# Patient Record
Sex: Female | Born: 2000 | Race: White | Hispanic: No | Smoking: Former smoker
Health system: Southern US, Community
[De-identification: ages and names within clinical notes are randomized; demographics above are authoritative.]

## PROBLEM LIST (undated history)

## (undated) ENCOUNTER — Inpatient Hospital Stay: Payer: Self-pay

## (undated) DIAGNOSIS — G459 Transient cerebral ischemic attack, unspecified: Secondary | ICD-10-CM

## (undated) DIAGNOSIS — I639 Cerebral infarction, unspecified: Secondary | ICD-10-CM

## (undated) DIAGNOSIS — K3580 Unspecified acute appendicitis: Secondary | ICD-10-CM

## (undated) HISTORY — DX: Transient cerebral ischemic attack, unspecified: G45.9

## (undated) HISTORY — PX: APPENDECTOMY: SHX54

---
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
## 2019-01-28 ENCOUNTER — Observation Stay
Admission: EM | Admit: 2019-01-28 | Discharge: 2019-01-29 | Disposition: A | Discharge status: Home / Self Care | Payer: Self-pay | Attending: Surgery | Admitting: Surgery

## 2019-01-28 ENCOUNTER — Encounter: Payer: Self-pay | Admitting: Emergency Medicine

## 2019-01-28 ENCOUNTER — Observation Stay: Payer: Self-pay | Admitting: Anesthesiology

## 2019-01-28 ENCOUNTER — Emergency Department: Payer: Self-pay

## 2019-01-28 ENCOUNTER — Encounter
Admission: EM | Disposition: A | Discharge status: Home / Self Care | Payer: Self-pay | Source: Home / Self Care | Attending: Emergency Medicine

## 2019-01-28 ENCOUNTER — Other Ambulatory Visit: Payer: Self-pay

## 2019-01-28 DIAGNOSIS — Z79899 Other long term (current) drug therapy: Secondary | ICD-10-CM | POA: Insufficient documentation

## 2019-01-28 DIAGNOSIS — K3580 Unspecified acute appendicitis: Secondary | ICD-10-CM

## 2019-01-28 DIAGNOSIS — Z791 Long term (current) use of non-steroidal anti-inflammatories (NSAID): Secondary | ICD-10-CM | POA: Insufficient documentation

## 2019-01-28 DIAGNOSIS — K3589 Other acute appendicitis without perforation or gangrene: Principal | ICD-10-CM | POA: Insufficient documentation

## 2019-01-28 DIAGNOSIS — E860 Dehydration: Secondary | ICD-10-CM | POA: Insufficient documentation

## 2019-01-28 DIAGNOSIS — Z1159 Encounter for screening for other viral diseases: Secondary | ICD-10-CM | POA: Insufficient documentation

## 2019-01-28 DIAGNOSIS — R112 Nausea with vomiting, unspecified: Secondary | ICD-10-CM

## 2019-01-28 DIAGNOSIS — D72828 Other elevated white blood cell count: Secondary | ICD-10-CM

## 2019-01-28 DIAGNOSIS — K353 Acute appendicitis with localized peritonitis, without perforation or gangrene: Secondary | ICD-10-CM

## 2019-01-28 DIAGNOSIS — R1084 Generalized abdominal pain: Secondary | ICD-10-CM

## 2019-01-28 HISTORY — DX: Unspecified acute appendicitis: K35.80

## 2019-01-28 HISTORY — PX: LAPAROSCOPIC APPENDECTOMY: SHX408

## 2019-01-28 LAB — URINALYSIS, ROUTINE W REFLEX MICROSCOPIC
Bilirubin Urine: NEGATIVE
Glucose, UA: NEGATIVE mg/dL
Hgb urine dipstick: NEGATIVE
Ketones, ur: 20 mg/dL — AB
Leukocytes,Ua: NEGATIVE
Nitrite: NEGATIVE
Protein, ur: NEGATIVE mg/dL
Specific Gravity, Urine: 1.024 (ref 1.005–1.030)
pH: 6 (ref 5.0–8.0)

## 2019-01-28 LAB — CBC WITH DIFFERENTIAL/PLATELET
Abs Immature Granulocytes: 0.06 10*3/uL (ref 0.00–0.07)
Basophils Absolute: 0 10*3/uL (ref 0.0–0.1)
Basophils Relative: 0 %
Eosinophils Absolute: 0 10*3/uL (ref 0.0–0.5)
Eosinophils Relative: 0 %
HCT: 42.6 % (ref 36.0–46.0)
Hemoglobin: 14.7 g/dL (ref 12.0–15.0)
Immature Granulocytes: 0 %
Lymphocytes Relative: 5 %
Lymphs Abs: 0.8 10*3/uL (ref 0.7–4.0)
MCH: 28.7 pg (ref 26.0–34.0)
MCHC: 34.5 g/dL (ref 30.0–36.0)
MCV: 83.2 fL (ref 80.0–100.0)
Monocytes Absolute: 0.4 10*3/uL (ref 0.1–1.0)
Monocytes Relative: 2 %
Neutro Abs: 15.2 10*3/uL — ABNORMAL HIGH (ref 1.7–7.7)
Neutrophils Relative %: 93 %
Platelets: 281 10*3/uL (ref 150–400)
RBC: 5.12 MIL/uL — ABNORMAL HIGH (ref 3.87–5.11)
RDW: 12.1 % (ref 11.5–15.5)
WBC: 16.5 10*3/uL — ABNORMAL HIGH (ref 4.0–10.5)
nRBC: 0 % (ref 0.0–0.2)

## 2019-01-28 LAB — SARS CORONAVIRUS 2 BY RT PCR (HOSPITAL ORDER, PERFORMED IN ~~LOC~~ HOSPITAL LAB): SARS Coronavirus 2: NEGATIVE

## 2019-01-28 LAB — COMPREHENSIVE METABOLIC PANEL
ALT: 12 U/L (ref 0–44)
AST: 18 U/L (ref 15–41)
Albumin: 4.8 g/dL (ref 3.5–5.0)
Alkaline Phosphatase: 52 U/L (ref 38–126)
Anion gap: 10 (ref 5–15)
BUN: 15 mg/dL (ref 6–20)
CO2: 23 mmol/L (ref 22–32)
Calcium: 9.4 mg/dL (ref 8.9–10.3)
Chloride: 103 mmol/L (ref 98–111)
Creatinine, Ser: 0.7 mg/dL (ref 0.44–1.00)
GFR calc Af Amer: >60 mL/min (ref >60)
GFR calc non Af Amer: >60 mL/min (ref >60)
Glucose, Bld: 124 mg/dL — ABNORMAL HIGH (ref 70–99)
Potassium: 3.7 mmol/L (ref 3.5–5.1)
Sodium: 136 mmol/L (ref 135–145)
Total Bilirubin: 1.2 mg/dL (ref 0.3–1.2)
Total Protein: 8.1 g/dL (ref 6.5–8.1)

## 2019-01-28 LAB — POCT PREGNANCY, URINE: Preg Test, Ur: NEGATIVE

## 2019-01-28 LAB — LIPASE, BLOOD: Lipase: 28 U/L (ref 11–51)

## 2019-01-28 SURGERY — APPENDECTOMY, LAPAROSCOPIC
Anesthesia: General

## 2019-01-28 MED ORDER — ONDANSETRON HCL 4 MG/2ML IJ SOLN
4.0000 mg | Freq: Once | INTRAMUSCULAR | Status: AC
  Administered 2019-01-28: 4 mg via INTRAVENOUS
  Filled 2019-01-28: qty 2

## 2019-01-28 MED ORDER — IOHEXOL 300 MG/ML  SOLN
75.0000 mL | Freq: Once | INTRAMUSCULAR | Status: AC | PRN
  Administered 2019-01-28: 75 mL via INTRAVENOUS

## 2019-01-28 MED ORDER — SODIUM CHLORIDE 0.9 % IV SOLN: 2.0000 g | INTRAVENOUS | Status: DC

## 2019-01-28 MED ORDER — SODIUM CHLORIDE 0.9 % IV BOLUS
1000.0000 mL | Freq: Once | INTRAVENOUS | Status: AC
  Administered 2019-01-28: 1000 mL via INTRAVENOUS

## 2019-01-28 MED ORDER — FENTANYL CITRATE (PF) 100 MCG/2ML IJ SOLN
INTRAMUSCULAR | Status: AC
  Filled 2019-01-28: qty 2

## 2019-01-28 MED ORDER — LIDOCAINE HCL (PF) 2 % IJ SOLN
INTRAMUSCULAR | Status: AC
  Filled 2019-01-28: qty 10

## 2019-01-28 MED ORDER — ONDANSETRON HCL 4 MG/2ML IJ SOLN
4.0000 mg | Freq: Four times a day (QID) | INTRAMUSCULAR | Status: DC | PRN
  Administered 2019-01-28: 14:00:00 4 mg via INTRAVENOUS

## 2019-01-28 MED ORDER — SODIUM CHLORIDE 0.9 % IV SOLN
INTRAVENOUS | Status: DC | PRN
  Administered 2019-01-28: 14:00:00 via INTRAVENOUS

## 2019-01-28 MED ORDER — MIDAZOLAM HCL 2 MG/2ML IJ SOLN
INTRAMUSCULAR | Status: AC
  Filled 2019-01-28: qty 2

## 2019-01-28 MED ORDER — FENTANYL CITRATE (PF) 100 MCG/2ML IJ SOLN
INTRAMUSCULAR | Status: DC | PRN
  Administered 2019-01-28 (×2): 50 ug via INTRAVENOUS

## 2019-01-28 MED ORDER — SODIUM CHLORIDE 0.9 % IV SOLN
2.0000 g | INTRAVENOUS | Status: AC
  Administered 2019-01-29: 2 g via INTRAVENOUS
  Filled 2019-01-28: qty 2
  Filled 2019-01-28: qty 20

## 2019-01-28 MED ORDER — ENOXAPARIN SODIUM 40 MG/0.4ML ~~LOC~~ SOLN: 40.0000 mg | SUBCUTANEOUS | Status: DC

## 2019-01-28 MED ORDER — SUGAMMADEX SODIUM 200 MG/2ML IV SOLN
INTRAVENOUS | Status: AC
  Filled 2019-01-28: qty 2

## 2019-01-28 MED ORDER — KCL IN DEXTROSE-NACL 20-5-0.45 MEQ/L-%-% IV SOLN
INTRAVENOUS | Status: DC
  Administered 2019-01-28 – 2019-01-29 (×2): via INTRAVENOUS
  Filled 2019-01-28 (×4): qty 1000

## 2019-01-28 MED ORDER — MORPHINE SULFATE (PF) 2 MG/ML IV SOLN: 2.0000 mg | INTRAVENOUS | Status: DC | PRN

## 2019-01-28 MED ORDER — SODIUM CHLORIDE 0.9 % IV SOLN
Freq: Once | INTRAVENOUS | Status: AC
  Administered 2019-01-28: 09:00:00 via INTRAVENOUS

## 2019-01-28 MED ORDER — METRONIDAZOLE IN NACL 5-0.79 MG/ML-% IV SOLN: 500.0000 mg | Freq: Three times a day (TID) | INTRAVENOUS | Status: DC

## 2019-01-28 MED ORDER — PROPOFOL 10 MG/ML IV BOLUS
INTRAVENOUS | Status: AC
  Filled 2019-01-28: qty 20

## 2019-01-28 MED ORDER — METRONIDAZOLE IN NACL 5-0.79 MG/ML-% IV SOLN
500.0000 mg | Freq: Once | INTRAVENOUS | Status: AC
  Administered 2019-01-28: 500 mg via INTRAVENOUS
  Filled 2019-01-28: qty 100

## 2019-01-28 MED ORDER — OXYCODONE HCL 5 MG PO TABS
5.0000 mg | ORAL_TABLET | ORAL | Status: DC | PRN
  Administered 2019-01-28 – 2019-01-29 (×3): 5 mg via ORAL
  Filled 2019-01-28 (×3): qty 1

## 2019-01-28 MED ORDER — FENTANYL CITRATE (PF) 100 MCG/2ML IJ SOLN: 25.0000 ug | INTRAMUSCULAR | Status: DC | PRN

## 2019-01-28 MED ORDER — PROPOFOL 10 MG/ML IV BOLUS
INTRAVENOUS | Status: DC | PRN
  Administered 2019-01-28: 150 mg via INTRAVENOUS

## 2019-01-28 MED ORDER — DICYCLOMINE HCL 10 MG PO CAPS: 20.0000 mg | ORAL_CAPSULE | Freq: Once | ORAL | Status: DC

## 2019-01-28 MED ORDER — ROCURONIUM BROMIDE 100 MG/10ML IV SOLN
INTRAVENOUS | Status: DC | PRN
  Administered 2019-01-28: 50 mg via INTRAVENOUS

## 2019-01-28 MED ORDER — MIDAZOLAM HCL 2 MG/2ML IJ SOLN
INTRAMUSCULAR | Status: DC | PRN
  Administered 2019-01-28: 2 mg via INTRAVENOUS

## 2019-01-28 MED ORDER — LIDOCAINE HCL 1 % IJ SOLN
INTRAMUSCULAR | Status: DC | PRN
  Administered 2019-01-28: 20 mL via SUBCUTANEOUS

## 2019-01-28 MED ORDER — OXYCODONE HCL 5 MG PO TABS: 5.0000 mg | ORAL_TABLET | Freq: Once | ORAL | Status: DC | PRN

## 2019-01-28 MED ORDER — ONDANSETRON 4 MG PO TBDP: 4.0000 mg | ORAL_TABLET | Freq: Four times a day (QID) | ORAL | Status: DC | PRN

## 2019-01-28 MED ORDER — KETOROLAC TROMETHAMINE 30 MG/ML IJ SOLN
INTRAMUSCULAR | Status: AC
  Filled 2019-01-28: qty 1

## 2019-01-28 MED ORDER — MORPHINE SULFATE (PF) 4 MG/ML IV SOLN
4.0000 mg | Freq: Once | INTRAVENOUS | Status: AC
  Administered 2019-01-28: 4 mg via INTRAVENOUS
  Filled 2019-01-28: qty 1

## 2019-01-28 MED ORDER — LACTATED RINGERS IV SOLN: INTRAVENOUS | Status: DC

## 2019-01-28 MED ORDER — DEXAMETHASONE SODIUM PHOSPHATE 10 MG/ML IJ SOLN
INTRAMUSCULAR | Status: DC | PRN
  Administered 2019-01-28: 4 mg via INTRAVENOUS

## 2019-01-28 MED ORDER — KETOROLAC TROMETHAMINE 30 MG/ML IJ SOLN
30.0000 mg | Freq: Four times a day (QID) | INTRAMUSCULAR | Status: DC
  Administered 2019-01-28 – 2019-01-29 (×3): 30 mg via INTRAVENOUS
  Filled 2019-01-28 (×3): qty 1

## 2019-01-28 MED ORDER — ROCURONIUM BROMIDE 50 MG/5ML IV SOLN
INTRAVENOUS | Status: AC
  Filled 2019-01-28: qty 1

## 2019-01-28 MED ORDER — FENTANYL CITRATE (PF) 250 MCG/5ML IJ SOLN
INTRAMUSCULAR | Status: AC
  Filled 2019-01-28: qty 5

## 2019-01-28 MED ORDER — ACETAMINOPHEN 500 MG PO TABS
1000.0000 mg | ORAL_TABLET | Freq: Four times a day (QID) | ORAL | Status: DC
  Administered 2019-01-28 – 2019-01-29 (×3): 1000 mg via ORAL
  Filled 2019-01-28 (×3): qty 2

## 2019-01-28 MED ORDER — SUGAMMADEX SODIUM 200 MG/2ML IV SOLN
INTRAVENOUS | Status: DC | PRN
  Administered 2019-01-28: 100 mg via INTRAVENOUS

## 2019-01-28 MED ORDER — BUPIVACAINE HCL (PF) 0.5 % IJ SOLN
INTRAMUSCULAR | Status: AC
  Filled 2019-01-28: qty 30

## 2019-01-28 MED ORDER — IOHEXOL 300 MG/ML  SOLN: 100.0000 mL | Freq: Once | INTRAMUSCULAR | Status: DC | PRN

## 2019-01-28 MED ORDER — KETOROLAC TROMETHAMINE 30 MG/ML IJ SOLN
INTRAMUSCULAR | Status: DC | PRN
  Administered 2019-01-28: 30 mg via INTRAVENOUS

## 2019-01-28 MED ORDER — LIDOCAINE HCL (PF) 1 % IJ SOLN
INTRAMUSCULAR | Status: AC
  Filled 2019-01-28: qty 30

## 2019-01-28 MED ORDER — ONDANSETRON HCL 4 MG/2ML IJ SOLN
INTRAMUSCULAR | Status: AC
  Filled 2019-01-28: qty 2

## 2019-01-28 MED ORDER — SODIUM CHLORIDE 0.9 % IV SOLN
2.0000 g | Freq: Once | INTRAVENOUS | Status: AC
  Administered 2019-01-28: 2 g via INTRAVENOUS
  Filled 2019-01-28: qty 20

## 2019-01-28 MED ORDER — KETOROLAC TROMETHAMINE 15 MG/ML IJ SOLN
15.0000 mg | Freq: Once | INTRAMUSCULAR | Status: DC
  Filled 2019-01-28: qty 1

## 2019-01-28 MED ORDER — METRONIDAZOLE IN NACL 5-0.79 MG/ML-% IV SOLN
500.0000 mg | Freq: Three times a day (TID) | INTRAVENOUS | Status: AC
  Administered 2019-01-28 – 2019-01-29 (×2): 500 mg via INTRAVENOUS
  Filled 2019-01-28 (×3): qty 100

## 2019-01-28 MED ORDER — ONDANSETRON HCL 4 MG/2ML IJ SOLN
4.0000 mg | Freq: Four times a day (QID) | INTRAMUSCULAR | Status: DC | PRN
  Administered 2019-01-28: 4 mg via INTRAVENOUS
  Filled 2019-01-28: qty 2

## 2019-01-28 MED ORDER — OXYCODONE HCL 5 MG/5ML PO SOLN: 5.0000 mg | Freq: Once | ORAL | Status: DC | PRN

## 2019-01-28 MED ORDER — OXYCODONE-ACETAMINOPHEN 5-325 MG PO TABS: 1.0000 | ORAL_TABLET | ORAL | 0 refills | Status: DC | PRN

## 2019-01-28 MED ORDER — SUCCINYLCHOLINE CHLORIDE 20 MG/ML IJ SOLN
INTRAMUSCULAR | Status: DC | PRN
  Administered 2019-01-28: 80 mg via INTRAVENOUS

## 2019-01-28 MED ORDER — SODIUM CHLORIDE 0.9 % IV SOLN: INTRAVENOUS | Status: DC | PRN

## 2019-01-28 MED ORDER — GLYCOPYRROLATE 0.2 MG/ML IJ SOLN
INTRAMUSCULAR | Status: AC
  Filled 2019-01-28: qty 1

## 2019-01-28 MED ORDER — PROPOFOL 10 MG/ML IV BOLUS
INTRAVENOUS | Status: AC
  Filled 2019-01-28: qty 40

## 2019-01-28 SURGICAL SUPPLY — 40 items
APPLIER CLIP LOGIC TI 5 (MISCELLANEOUS) ×3 IMPLANT
BLADE SURG SZ11 CARB STEEL (BLADE) ×3 IMPLANT
CANISTER SUCT 3000ML PPV (MISCELLANEOUS) ×3 IMPLANT
CHLORAPREP W/TINT 26 (MISCELLANEOUS) ×3 IMPLANT
COVER WAND RF STERILE (DRAPES) ×3 IMPLANT
CUTTER FLEX LINEAR 45M (STAPLE) ×3 IMPLANT
DECANTER SPIKE VIAL GLASS SM (MISCELLANEOUS) ×3 IMPLANT
DERMABOND ADVANCED (GAUZE/BANDAGES/DRESSINGS) ×2
DERMABOND ADVANCED .7 DNX12 (GAUZE/BANDAGES/DRESSINGS) ×1 IMPLANT
ELECT REM PT RETURN 9FT ADLT (ELECTROSURGICAL) ×3
ELECTRODE REM PT RTRN 9FT ADLT (ELECTROSURGICAL) ×1 IMPLANT
GLOVE BIO SURGEON STRL SZ7 (GLOVE) ×3 IMPLANT
GLOVE BIOGEL PI IND STRL 7.5 (GLOVE) ×1 IMPLANT
GLOVE BIOGEL PI INDICATOR 7.5 (GLOVE) ×2
GOWN STRL REUS W/ TWL LRG LVL4 (GOWN DISPOSABLE) ×1 IMPLANT
GOWN STRL REUS W/ TWL XL LVL3 (GOWN DISPOSABLE) ×1 IMPLANT
GOWN STRL REUS W/TWL LRG LVL4 (GOWN DISPOSABLE) ×2
GOWN STRL REUS W/TWL XL LVL3 (GOWN DISPOSABLE) ×2
GRASPER SUT TROCAR 14GX15 (MISCELLANEOUS) ×3 IMPLANT
IRRIGATION STRYKERFLOW (MISCELLANEOUS) IMPLANT
IRRIGATOR STRYKERFLOW (MISCELLANEOUS)
KIT TURNOVER KIT A (KITS) ×3 IMPLANT
NEEDLE HYPO 22GX1.5 SAFETY (NEEDLE) ×3 IMPLANT
NEEDLE INSUFFLATION 14GA 120MM (NEEDLE) ×3 IMPLANT
NS IRRIG 1000ML POUR BTL (IV SOLUTION) ×3 IMPLANT
PACK LAP CHOLECYSTECTOMY (MISCELLANEOUS) ×3 IMPLANT
POUCH SPECIMEN RETRIEVAL 10MM (ENDOMECHANICALS) ×3 IMPLANT
RELOAD 45 VASCULAR/THIN (ENDOMECHANICALS) IMPLANT
RELOAD STAPLE TA45 3.5 REG BLU (ENDOMECHANICALS) ×3 IMPLANT
SET TUBE SMOKE EVAC HIGH FLOW (TUBING) ×3 IMPLANT
SHEARS HARMONIC ACE PLUS 36CM (ENDOMECHANICALS) ×3 IMPLANT
SLEEVE ENDOPATH XCEL 5M (ENDOMECHANICALS) ×3 IMPLANT
SOL .9 NS 3000ML IRR  AL (IV SOLUTION)
SOL .9 NS 3000ML IRR UROMATIC (IV SOLUTION) IMPLANT
SUT MNCRL AB 4-0 PS2 18 (SUTURE) ×3 IMPLANT
SUT VICRYL 0 UR6 27IN ABS (SUTURE) ×3 IMPLANT
SUT VICRYL AB 3-0 FS1 BRD 27IN (SUTURE) ×3 IMPLANT
TRAY FOLEY MTR SLVR 16FR STAT (SET/KITS/TRAYS/PACK) ×3 IMPLANT
TROCAR XCEL 12X100 BLDLESS (ENDOMECHANICALS) ×3 IMPLANT
TROCAR XCEL NON-BLD 5MMX100MML (ENDOMECHANICALS) ×3 IMPLANT

## 2019-01-28 NOTE — Op Note (Signed)
SURGICAL OPERATIVE REPORT  DATE OF PROCEDURE: 01/28/2019  ATTENDING Surgeon(s): Ancil Linsey, MD  ASSISTANT(S): None  ANESTHESIA: GETA (General)  PRE-OPERATIVE DIAGNOSIS: Acute non-perforated appendicitis with localized peritonitis (K35.30)  POST-OPERATIVE DIAGNOSIS: Acute non-perforated appendicitis with localized peritonitis (K35.30)  PROCEDURE(S):  1.) Laparoscopic appendectomy (cpt: 44970)  INTRAOPERATIVE FINDINGS: Moderately inflamed non-perforated appendix with murky pelvic and minimal peri-appendiceal ascites  INTRAVENOUS FLUIDS: 1100 mL crystalloid   ESTIMATED BLOOD LOSS: Minimal (<20 mL)  URINE OUTPUT: 100 mL   SPECIMENS: Appendix  IMPLANTS: None  DRAINS: None  COMPLICATIONS: None apparent  CONDITION AT END OF PROCEDURE: Hemodynamically stable and extubated  DISPOSITION OF PATIENT: PACU  INDICATIONS FOR PROCEDURE:  Patient is a 18 y.o. otherwise reportedly healthy female who presented with acute onset of peri-umbilical abdominal pain that woke the patient from sleep and then progressed to become greatest at her Right lower quadrant and lower abdomen. Patient reported her pain was overall well-controlled in the Emergency Department. All risks, benefits, and alternatives to appendectomy were discussed with the patient, all of patient's questions were answered to her expressed satisfaction, and informed consent was obtained and documented.  DETAILS OF PROCEDURE: Patient was brought to the operating suite and appropriately identified. General anesthesia was administered along with confirmation of appropriate pre-operative antibiotics, and endotracheal intubation was performed by anesthetist, along with NG/OG tube for gastric decompression. In supine position, operative site was prepped and draped in usual sterile fashion, and following a brief time out, initial 5 mm incision was made in a natural skin crease just below the umbilicus. Fascia was then elevated, and  a Verress needle was inserted and its proper position confirmed using saline meniscus test prior to abdominal insufflation.  Upon insufflation of the abdominal cavity with carbon dioxide to a well-tolerated pressure of 12-15 mmHg, a 5 mm peri-umbilical port followed by laparoscope were inserted and used to inspect the abdominal cavity and its contents with no injuries from insertion of the first trochar noted. Two additional trocars were inserted, a 12 mm port at the Left lower quadrant position and another 5 mm port at the suprapubic position. The table was then placed in Trendelenburg position with the Right side up, and blunt graspers were gently used to retract the bowel overlying a moderately inflamed appendix surrounded by mild peri-appendiceal inflammation and murky pelvic and minimal peri-appendiceal ascites. The appendix was gently retracted by near its tip, and the base of the appendix and mesoappendix were identified in relation to the cecum. The mesoappendix was dissected from the visceral appendix and hemostasis achieved using a harmonic scalpel. Upon freeing the visceral appendix from the mesoappendix, an endostapler loaded with a standard blue tissue load was advanced across the base of the visceral appendix, which was compressed for several seconds, and the stapler was deployed and removed from the abdominal cavity. Hemostasis was well-achieved with a hemoclip at the mesenteric aspect of the appendiceal stump, and the specimen was extracted from the abdominal cavity in a laparoscopic specimen bag.  The intraperitoneal cavity was inspected with no additional findings. PMI laparoscopic fascial closure device was then used to re-approximate fascia at the 12 mm Left lower quadrant port site. All ports were then removed under direct visualization, and the abdominal cavity was desuflated. All port sites were irrigated/cleaned, additional local anesthetic was injected at each incision, 3-0 Vicryl was  used to re-approximate dermis at 10/12 mm port site(s), and subcuticular 4-0 Monocryl suture was used to re-approximate skin. Skin was then cleaned, dried, and sterile  skin glue was applied. Patient was then safely able to be awakened, extubated, and transferred to PACU for post-operative monitoring and care.   I was present for all aspects of the above procedure, and no operative complications were apparent.

## 2019-01-28 NOTE — ED Triage Notes (Signed)
Patient ambulatory to triage with steady gait, without difficulty or distress noted; pt reports awoke with generalized abd pain with N/V

## 2019-01-28 NOTE — Anesthesia Postprocedure Evaluation (Signed)
Anesthesia Post Note  Patient: Candice Villarreal  Procedure(s) Performed: APPENDECTOMY LAPAROSCOPIC (N/A )  Patient location during evaluation: PACU Anesthesia Type: General Level of consciousness: awake and alert and oriented Pain management: pain level controlled Vital Signs Assessment: post-procedure vital signs reviewed and stable Respiratory status: spontaneous breathing, nonlabored ventilation and respiratory function stable Cardiovascular status: blood pressure returned to baseline and stable Postop Assessment: no signs of nausea or vomiting Anesthetic complications: no     Last Vitals:  Vitals:   01/28/19 1136 01/28/19 1449  BP: 115/61 120/75  Pulse: 73 (!) 105  Resp: 20 20  Temp: 37.5 C 36.5 C  SpO2: 100% 99%    Last Pain:  Vitals:   01/28/19 1449  TempSrc:   PainSc: 0-No pain                 Gardiner Espana

## 2019-01-28 NOTE — Transfer of Care (Signed)
Immediate Anesthesia Transfer of Care Note  Patient: Candice Villarreal  Procedure(s) Performed: APPENDECTOMY LAPAROSCOPIC (N/A )  Patient Location: PACU  Anesthesia Type:General  Level of Consciousness: awake, alert  and oriented  Airway & Oxygen Therapy: Patient Spontanous Breathing and Patient connected to face mask oxygen  Post-op Assessment: Report given to RN and Post -op Vital signs reviewed and stable  Post vital signs: Reviewed and stable  Last Vitals:  Vitals Value Taken Time  BP    Temp 36.5 C 01/28/2019  2:49 PM  Pulse    Resp    SpO2      Last Pain:  Vitals:   01/28/19 1136  TempSrc: Temporal  PainSc: 4          Complications: No apparent anesthesia complications

## 2019-01-28 NOTE — Anesthesia Procedure Notes (Signed)
Procedure Name: Intubation Date/Time: 01/28/2019 1:25 PM Performed by: Gentry Fitz, CRNA Pre-anesthesia Checklist: Patient identified, Emergency Drugs available, Suction available and Patient being monitored Patient Re-evaluated:Patient Re-evaluated prior to induction Oxygen Delivery Method: Circle system utilized Preoxygenation: Pre-oxygenation with 100% oxygen Induction Type: IV induction and Rapid sequence Laryngoscope Size: Mac and 4 Grade View: Grade II Tube type: Oral Tube size: 7.5 mm Number of attempts: 1 Placement Confirmation: ETT inserted through vocal cords under direct vision,  positive ETCO2 and breath sounds checked- equal and bilateral Secured at: 21 cm Tube secured with: Tape Dental Injury: Teeth and Oropharynx as per pre-operative assessment

## 2019-01-28 NOTE — Anesthesia Preprocedure Evaluation (Signed)
Anesthesia Evaluation  Patient identified by MRN, date of birth, ID band Patient awake    Reviewed: Allergy & Precautions, H&P , NPO status , Patient's Chart, lab work & pertinent test results  Airway Mallampati: II  TM Distance: >3 FB Neck ROM: full    Dental  (+) Chipped   Pulmonary neg shortness of breath, asthma ,           Cardiovascular Exercise Tolerance: Good (-) angina(-) Past MI and (-) DOE negative cardio ROS       Neuro/Psych negative neurological ROS  negative psych ROS   GI/Hepatic negative GI ROS, Neg liver ROS, neg GERD  ,  Endo/Other  negative endocrine ROS  Renal/GU      Musculoskeletal   Abdominal   Peds  Hematology negative hematology ROS (+)   Anesthesia Other Findings History reviewed. No pertinent past medical history.  History reviewed. No pertinent surgical history.  BMI    Body Mass Index:  22.46 kg/m      Reproductive/Obstetrics negative OB ROS                             Anesthesia Physical Anesthesia Plan  ASA: III  Anesthesia Plan: General ETT and Rapid Sequence   Post-op Pain Management:    Induction: Intravenous  PONV Risk Score and Plan: Ondansetron, Dexamethasone, Midazolam and Treatment may vary due to age or medical condition  Airway Management Planned: Oral ETT  Additional Equipment:   Intra-op Plan:   Post-operative Plan: Extubation in OR  Informed Consent: I have reviewed the patients History and Physical, chart, labs and discussed the procedure including the risks, benefits and alternatives for the proposed anesthesia with the patient or authorized representative who has indicated his/her understanding and acceptance.     Dental Advisory Given  Plan Discussed with: Anesthesiologist, CRNA and Surgeon  Anesthesia Plan Comments: (Patient consented for risks of anesthesia including but not limited to:  - adverse reactions to  medications - damage to teeth, lips or other oral mucosa - sore throat or hoarseness - Damage to heart, brain, lungs or loss of life  Patient voiced understanding.)        Anesthesia Quick Evaluation

## 2019-01-28 NOTE — ED Provider Notes (Signed)
[facility] Emergency Department Provider Note  ____________________________________________   First MD Initiated Contact with Patient 01/28/19 [phone]     (approximate)  I have reviewed the triage vital signs and the nursing notes.   HISTORY  Chief Complaint Abdominal Pain   HPI Candice Villarreal is a 18 y.o. female   with PMHx pyelonephritis here w/ abdominal pain. Pt awoke this morning at around 2 AM with sharp, stabbing, lower midline suprapubic pain. Since then, she's had diffuse pain and now b/l flank pain with nausea, vomiting. She felt flushed but has not had a fever. She's had persistent nausea and NBNB emesis. No diarrhea. No suspicious food intake. 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 She's had a few days of intermittent burning and frequency with urination as well. No other complaints. No recent trauma. No vaginal bleeding or discharge. Pain is worse w/ palpation and movement. No alleviating factors.     History reviewed. No pertinent past medical history.  There are no active problems to display for this patient.   History reviewed. No pertinent surgical history.  Prior to Admission medications   Medication Sig Start Date End Date Taking? Authorizing Provider  ibuprofen (ADVIL) 200 MG tablet Take 400-600 mg by mouth every 6 (six) hours as needed for fever or mild pain.   Yes [provider]    Allergies Patient has no known allergies.  No family history on file.  Social History Social History   Tobacco Use   Smoking status: Never Smoker   Smokeless tobacco: Never Used  Substance Use Topics   Alcohol use: Not Currently   Drug use: Never    Review of Systems Constitutional: No fever/chills, + flushing Eyes: No visual changes. ENT: No sore throat. Cardiovascular: Denies chest pain. Respiratory: Denies shortness of breath. Gastrointestinal: +nausea, vomiting, abdominal  pain Genitourinary: +dysuria, frequency Musculoskeletal: Negative for neck pain.  Negative for back pain. Integumentary: Negative for rash. Neurological: Negative for headaches, focal weakness or numbness.   ____________________________________________   PHYSICAL EXAM:  VITAL SIGNS: ED Triage Vitals  Enc Vitals Group     BP 01/28/19 0645 124/69     Pulse Rate 01/28/19 0645 91     Resp 01/28/19 0645 (!) 22     Temp 01/28/19 0645 97.9 F (36.6 C)     Temp Source 01/28/19 0645 Oral     SpO2 01/28/19 0645 100 %     Weight 01/28/19 0643 115 lb (52.2 kg)     Height 01/28/19 0643 5' (1.524 m)     Head Circumference --      Peak Flow --      Pain Score 01/28/19 0643 8     Pain Loc --      Pain Edu? --      Excl. in GC? --     Constitutional: Alert and oriented. Well appearing and in no acute distress. Eyes: Conjunctivae are normal.  Head: Atraumatic. Nose: No congestion/rhinnorhea. Mouth/Throat: Mucous membranes are dry. OP clear. Neck: No stridor.  No meningeal signs.   Cardiovascular: Normal rate, regular rhythm. Good peripheral circulation. Grossly normal heart sounds. Respiratory: Normal respiratory effort.  No retractions. No audible wheezing. Gastrointestinal: Mild diffuse TTP, no rebound or guarding. +CVAT on R>L. No specific RLQ TTP.  Musculoskeletal: No lower extremity tenderness nor edema. No gross deformities of extremities. Neurologic:  Normal speech and language. No gross focal neurologic deficits are appreciated.  Skin:  Skin is warm,  dry and intact. No rash noted.  ____________________________________________   LABS (all labs ordered are listed, but only abnormal results are displayed)  Labs Reviewed  CBC WITH DIFFERENTIAL/PLATELET - Abnormal; Notable for the following components:      Result Value   WBC 16.5 (*)    RBC 5.12 (*)    Neutro Abs 15.2 (*)    All other components within normal limits  COMPREHENSIVE METABOLIC PANEL - Abnormal; Notable for the  following components:   Glucose, Bld 124 (*)    All other components within normal limits  URINALYSIS, ROUTINE W REFLEX MICROSCOPIC - Abnormal; Notable for the following components:   Color, Urine YELLOW (*)    APPearance CLOUDY (*)    Ketones, ur 20 (*)    All other components within normal limits  LIPASE, BLOOD  POC URINE PREG, ED  POCT PREGNANCY, URINE   ____________________________________________  EKG  None ____________________________________________  RADIOLOGY  ED MD interpretation:  Concern for acute appendicitis, possible rupture.  Official radiology report(s): Ct Abdomen Pelvis W Contrast  Result Date: 01/28/2019 CLINICAL DATA:  Abdominal pain with nausea and vomiting. Leukocytosis. EXAM: CT ABDOMEN AND PELVIS WITH CONTRAST TECHNIQUE: Multidetector CT imaging of the abdomen and pelvis was performed using the standard protocol following bolus administration of intravenous contrast. CONTRAST:  17mL OMNIPAQUE IOHEXOL 300 MG/ML  SOLN COMPARISON:  May 15, 2018 FINDINGS: Lower chest: Lung bases are clear. Hepatobiliary: No focal liver lesions are appreciable. The gallbladder wall is not appreciably thickened. There is no biliary duct dilatation. Pancreas: There is no pancreatic mass or inflammatory focus. Spleen: No splenic lesions are evident. Adrenals/Urinary Tract: Adrenals appear normal bilaterally. There is no evident renal mass or hydronephrosis. There is an extrarenal pelvis on each side, an anatomic variant. There is no appreciable renal or ureteral calculus on either side. Urinary bladder is midline with wall thickness within normal limits. Stomach/Bowel: There is no appreciable bowel wall or mesenteric thickening. The terminal ileum appears within normal limits. There is no demonstrable bowel obstruction. There is no free air or portal venous air. Vascular/Lymphatic: There is no abdominal aortic aneurysm. No evident vascular lesion. There is no appreciable adenopathy in  the abdomen or pelvis. Reproductive: The uterus is anteverted. There is no well-defined pelvic mass. There is fluid tracking into the cul-de-sac from a rightward approach. Other: The appendix is somewhat ill-defined. Portions of the appendix measure up to 11 mm in diameter. There is amorphous calcification in the region of the appendix which may represent ill-defined appendicolith. There is fluid surrounding portions of the distal appendix with fluid tracking inferiorly and posteriorly in the pelvis from the level of the appendix. No abscess is seen in the right lower quadrant/periappendiceal region. No extraluminal air seen. There is no frank abscess in the abdomen or pelvis. There is no ascites beyond the fluid seen in the right pelvis/cul-de-sac region. Musculoskeletal: No blastic or lytic bone lesions. There is no intramuscular or abdominal wall lesion. IMPRESSION: 1. Appendix is indistinct. The appendix is felt to be distended with surrounding fluid. Appearance is felt to be indicative of a degree of appendicitis. Appendix: Location: Arising inferomedial from the cecum extending inferior into the mid pelvis from a right lateral approach. Diameter: Up to 11 mm. Note that the appendiceal wall wall is indistinct. Appendicolith: Somewhat amorphous calcification toward the distal appendix measuring 1.2 x 1.1 cm. Well-defined appendicolith not seen. Mucosal hyper-enhancement: None Extraluminal gas: None Periappendiceal collection: Fluid tracks from the appendix into the pelvis. No apparent  abscess. 2. No bowel obstruction. No abscess in the abdomen or pelvis. No free air evident. 3. No evident renal or ureteral calculus. No hydronephrosis. Urinary bladder wall thickness normal. Critical Value/emergent results were called by telephone at the time of interpretation on 01/28/2019 at 8:54 am to Dr. Shaune Pollack , who verbally acknowledged these results. Electronically Signed   By: Bretta Bang III M.D.   On:  01/28/2019 08:54    ____________________________________________   PROCEDURES   Procedure(s) performed (including Critical Care):  .Critical Care Performed by: Shaune Pollack, MD Authorized by: Shaune Pollack, MD   Critical care provider statement:    Critical care time (minutes):  35   Critical care time was exclusive of:  Separately billable procedures and treating other patients and teaching time   Critical care was necessary to treat or prevent imminent or life-threatening deterioration of the following conditions:  Cardiac failure and circulatory failure (Perforated viscus)   Critical care was time spent personally by me on the following activities:  Development of treatment plan with patient or surrogate, discussions with consultants, evaluation of patient's response to treatment, examination of patient, obtaining history from patient or surrogate, ordering and performing treatments and interventions, ordering and review of laboratory studies, ordering and review of radiographic studies, pulse oximetry, re-evaluation of patient's condition and review of old charts   I assumed direction of critical care for this patient from another provider in my specialty: no       ____________________________________________   INITIAL IMPRESSION / MDM / ASSESSMENT AND PLAN / ED COURSE  As part of my medical decision making, I reviewed the following data within the electronic MEDICAL RECORD NUMBER Nursing notes reviewed and incorporated, Labs reviewed as per MDM, Old chart reviewed, Notes from prior ED visits and [place] Controlled Substance Database     *Arrow Emmerich was evaluated in Emergency Department on 01/28/2019 for the symptoms described in the history of present illness. She was evaluated in the context of the global COVID-19 pandemic, which necessitated consideration that the patient might be at risk for infection with the SARS-CoV-2 virus that causes COVID-19. Institutional protocols and  algorithms that pertain to the evaluation of patients at risk for COVID-19 are in a state of rapid change based on information released by regulatory bodies including the CDC and federal and state organizations. These policies and algorithms were followed during the patient's care in the ED.  Some ED evaluations and interventions may be delayed as a result of limited staffing during the pandemic.*  MDM: 18 yo F here with diffuse abd pain. Suspect UTI with possible early pyelo, DDx includes gastritis, viral GI illness. No focal RLQ TTP, time course is less c/w appendicitis but will follow-up labs and response to treatment. No signs of peritonitis. 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 No cough or signs of COVID. Fluids, antiemetics ordered.  Labs reviewed. CMP reassuring with normal LFTs and renal function. Lipase normal.   Clinical Course as of Jan 27 905  Tue Jan 28, 2019  0815 Noted, CT pending. Likely reactive.  CBC with Differential(!) [CI]  0815 No signs of UTI, mild dehydration noted. IVF given.  Urinalysis, Routine w reflex microscopic(!) [CI]  0815 Doubt cholecystitis. Normal LFTs.  Comprehensive metabolic panel(!) [CI]    Clinical Course User Index [CI] Shaune Pollack, MD    Labs and imaging reviewed.  CT concerning for appendicitis with possible early rupture.  Dr. Earlene Plater of general surgery  to see.  Patient and mother updated via telephone.  Rocephin and Flagyl given.  No signs of sepsis or systemic illness. ____________________________________________  FINAL CLINICAL IMPRESSION(S) / ED DIAGNOSES  Final diagnoses:  Other elevated white blood cell (WBC) count  Non-intractable vomiting with nausea, unspecified vomiting type  Abdominal pain, generalized  Mild dehydration  Other acute appendicitis     MEDICATIONS GIVEN DURING THIS VISIT:  Medications  iohexol (OMNIPAQUE) 300 MG/ML solution 100 mL ( Intravenous Canceled Entry  01/28/19 0826)  cefTRIAXone (ROCEPHIN) 2 g in sodium chloride 0.9 % 100 mL IVPB (has no administration in time range)    And  metroNIDAZOLE (FLAGYL) IVPB 500 mg (has no administration in time range)  morphine 4 MG/ML injection 4 mg (has no administration in time range)  0.9 %  sodium chloride infusion (has no administration in time range)  sodium chloride 0.9 % bolus 1,000 mL (1,000 mLs Intravenous New Bag/Given 01/28/19 0739)  ondansetron (ZOFRAN) injection 4 mg (4 mg Intravenous Given 01/28/19 0742)  iohexol (OMNIPAQUE) 300 MG/ML solution 75 mL (75 mLs Intravenous Contrast Given 01/28/19 0831)     ED Discharge Orders    None       Note:  This document was prepared using Dragon voice recognition software and may include unintentional dictation errors.   Shaune Pollack, MD 01/28/19 [phone]

## 2019-01-28 NOTE — H&P (Signed)
[facility] SURGICAL HISTORY & PHYSICAL   HISTORY OF PRESENT ILLNESS (HPI):  18 y.o. female presented to [facility] ED today for evaluation of abdominal pain. Patient reports the acute onset of sharp lower/suprapubic abdominal pain around 2 AM this morning. The pain woke her from sleep. It was been constant and severe since the onset. She has gotten some relief with pain medications in the ED. She endorses associated nausea and emesis since the pain. No fever, chills, congestion, cough, SOB, CP, or bladder/bowel changes. No history of similar pain in the past. No previous abdominal surgeries. She has been NPO since going to sleep last night. Work up in the ED was concerning for leukocytosis and acute appendicitis.   Surgery is consulted by emergency medicine physician Dr. Shaune Pollack, MD in this context for evaluation and management of acute appendicitis.   PAST MEDICAL HISTORY (PMH):  History reviewed. No pertinent past medical history.   PAST SURGICAL HISTORY (PSH):  History reviewed. No pertinent surgical history.   MEDICATIONS:  Prior to Admission medications   Medication Sig Start Date End Date Taking? Authorizing Provider  ibuprofen (ADVIL) 200 MG tablet Take 400-600 mg by mouth every 6 (six) hours as needed for fever or mild pain.   Yes [provider]     ALLERGIES:  No Known Allergies   SOCIAL HISTORY:  Social History   Socioeconomic History  . Marital status: Single    Spouse name: Not on file  . Number of children: Not on file  . Years of education: Not on file  . Highest education level: Not on file  Occupational History  . Not on file  Social Needs  . Financial resource strain: Not on file  . Food insecurity:    Worry: Not on file    Inability: Not on file  . Transportation needs:    Medical: Not on file    Non-medical: Not on file  Tobacco Use  . Smoking status: Never Smoker  . Smokeless tobacco: Never Used  Substance and Sexual  Activity  . Alcohol use: Not Currently  . Drug use: Never  . Sexual activity: Not on file  Lifestyle  . Physical activity:    Days per week: Not on file    Minutes per session: Not on file  . Stress: Not on file  Relationships  . Social connections:    Talks on phone: Not on file    Gets together: Not on file    Attends religious service: Not on file    Active member of club or organization: Not on file    Attends meetings of clubs or organizations: Not on file    Relationship status: Not on file  . Intimate partner violence:    Fear of current or ex partner: Not on file    Emotionally abused: Not on file    Physically abused: Not on file    Forced sexual activity: Not on file  Other Topics Concern  . Not on file  Social History Narrative  . Not on file     FAMILY HISTORY:  No family history on file.    REVIEW OF SYSTEMS:  Review of Systems  Constitutional: Negative for chills and fever.  HENT: Negative for congestion and sore throat.   Respiratory: Negative for cough and shortness of breath.   Cardiovascular: Negative for chest pain and palpitations.  Gastrointestinal: Positive for abdominal pain, nausea and vomiting. Negative for constipation and diarrhea.  Genitourinary: Negative for dysuria and  urgency.  All other systems reviewed and are negative.   VITAL SIGNS:  Temp:  [97.9 F (36.6 C)] 97.9 F (36.6 C) (06/02 0645) Pulse Rate:  [91] 91 (06/02 0645) Resp:  [22] 22 (06/02 0645) BP: (124)/(69) 124/69 (06/02 0645) SpO2:  [100 %] 100 % (06/02 0645) Weight:  [52.2 kg] 52.2 kg (06/02 0643)     Height: 5' (152.4 cm) Weight: 52.2 kg BMI (Calculated): 22.46   INTAKE/OUTPUT:  This shift: No intake/output data recorded.  Last 2 shifts: @IOLAST2SHIFTS @   PHYSICAL EXAM:  Physical Exam Vitals signs and nursing note reviewed. Exam conducted with a chaperone present.  Constitutional:      General: She is not in acute distress.    Appearance: She is well-developed  and normal weight. She is not ill-appearing.  HENT:     Head: Normocephalic and atraumatic.  Eyes:     General: No scleral icterus.    Extraocular Movements: Extraocular movements intact.  Cardiovascular:     Rate and Rhythm: Normal rate and regular rhythm.     Heart sounds: Normal heart sounds. No murmur. No friction rub. No gallop.   Pulmonary:     Effort: Pulmonary effort is normal. No respiratory distress.     Breath sounds: Normal breath sounds. No wheezing or rhonchi.  Abdominal:     General: Abdomen is flat. There is no distension.     Palpations: Abdomen is soft.     Tenderness: There is abdominal tenderness in the right lower quadrant. There is no guarding or rebound. Positive signs include Rovsing's sign and McBurney's sign.  Genitourinary:    Comments: Deferred Skin:    General: Skin is warm and dry.     Coloration: Skin is not jaundiced or pale.  Neurological:     General: No focal deficit present.     Mental Status: She is alert and oriented to person, place, and time.  Psychiatric:        Mood and Affect: Mood is anxious.        Behavior: Behavior normal.     Comments: She is somewhat anxious about surgery       Labs:  CBC Latest Ref Rng & Units 01/28/2019 05/15/2018 05/14/2018  WBC 4.0 - 10.5 K/uL 16.5(H) 11.2(H) 13.3(H)  Hemoglobin 12.0 - 15.0 g/dL 16.114.7 09.612.8 04.513.7  Hematocrit 36.0 - 46.0 % 42.6 37.1 39.5  Platelets 150 - 400 K/uL 281 298 281   CMP Latest Ref Rng & Units 01/28/2019 05/15/2018 05/14/2018  Glucose 70 - 99 mg/dL 409(W124(H) - 94  BUN 6 - 20 mg/dL 15 - 13  Creatinine 1.190.44 - 1.00 mg/dL 1.470.70 - 8.290.71  Sodium 562135 - 145 mmol/L 136 - 135  Potassium 3.5 - 5.1 mmol/L 3.7 - 3.7  Chloride 98 - 111 mmol/L 103 - 101  CO2 22 - 32 mmol/L 23 - 24  Calcium 8.9 - 10.3 mg/dL 9.4 - 9.3  Total Protein 6.5 - 8.1 g/dL 8.1 1.3(Y8.5(H) 8.6(V8.5(H)  Total Bilirubin 0.3 - 1.2 mg/dL 1.2 7.8(I1.4(H) 1.2  Alkaline Phos 38 - 126 U/L 52 65 61  AST 15 - 41 U/L 18 16 15   ALT 0 - 44 U/L 12 12 12       Imaging studies:   CT Abdomen/Pelvis (01/28/2019) personally reviewed, appendix is ill-defined, there is fluid surrounding the appendix/pelvis however felt to be inflammatory, no defined abscess. Radiologist report also reviewed below:  IMPRESSION: 1. Appendix is indistinct. The appendix is felt to be distended with surrounding fluid.  Appearance is felt to be indicative of a degree of appendicitis.  Appendix: Location: Arising inferomedial from the cecum extending inferior into the mid pelvis from a right lateral approach. Diameter: Up to 11 mm. Note that the appendiceal wall wall is indistinct. Appendicolith: Somewhat amorphous calcification toward the distal appendix measuring 1.2 x 1.1 cm. Well-defined appendicolith not seen. Mucosal hyper-enhancement: None Extraluminal gas: None Periappendiceal collection: Fluid tracks from the appendix into the pelvis. No apparent abscess.  2. No bowel obstruction. No abscess in the abdomen or pelvis. No free air evident. 3. No evident renal or ureteral calculus. No hydronephrosis. Urinary bladder wall thickness normal.   Assessment/Plan: (ICD-10's: K35.80) 18 y.o. female with leukocytosis and acute onset of severe lower abdominal pain attributable to acute appendicitis   - Admit to general surgery  - NPO + IVF + IV ABx (Ceftriaxone + Flagyl)  - pain control prn; antiemetics prn  - monitor abdominal examination  - Will plan for laparoscopic appendectomy with Dr Earlene Plater today pending OR/Anesthesia availability  - *All risks, benefits, and alternatives to the above procedure(s) were discussed with the patient, all of her questions were answered to her expressed satisfaction, patient expresses she wishes to proceed, and informed consent was obtained.  - Medical management of comorbidities    - DVT prophylaxis; hold for surgery  All of the above findings and recommendations were discussed with the patient and the medical team, and all  of patient's questions were answered to her expressed satisfaction.  Thank you for the opportunity to participate in this patient's care.   -- Lynden Oxford, PA-C [facility] 01/28/2019, 9:53 AM [phone] M-F: 7am - 4pm

## 2019-01-28 NOTE — Discharge Instructions (Signed)
In addition to included general post-operative instructions for Laparoscopic Appendectomy,  Diet: Resume home heart healthy diet.   Activity: No heavy lifting >20 pounds (children, pets, laundry, garbage) or strenuous activity until follow-up in 2 weeks, but light activity and walking are encouraged. Do not drive or drink alcohol if taking narcotic pain medications.  Wound care: 2 days after surgery (Thursday, 6/4), you may shower/get incision wet with soapy water and pat dry (do not rub incisions), but no baths or submerging incision underwater until follow-up.   Medications: Resume all home medications. For mild to moderate pain: acetaminophen (Tylenol) or ibuprofen/naproxen (if no kidney disease). Combining Tylenol with alcohol can substantially increase your risk of causing liver disease. Narcotic pain medications, if prescribed, can be used for severe pain, though may cause nausea, constipation, and drowsiness. Do not combine Tylenol and Percocet (or similar) within a 6 hour period as Percocet (and similar) contain(s) Tylenol. If you do not need the narcotic pain medication, you do not need to fill the prescription.  Call office [phone]) at any time if any questions, worsening pain, fevers/chills, bleeding, drainage from incision site, or other concerns.

## 2019-01-28 NOTE — Anesthesia Post-op Follow-up Note (Signed)
Anesthesia QCDR form completed.        

## 2019-01-28 NOTE — Consult Note (Signed)
[facility] SURGICAL CONSULTATION NOTE (initial) - cpt: 14782   HISTORY OF PRESENT ILLNESS (HPI):  18 y.o. female presented to [facility] ED today for evaluation of abdominal pain. Patient reports the acute onset of sharp lower/suprapubic abdominal pain around 2 AM this morning. The pain woke her from sleep. It was been constant and severe since the onset. She has gotten some relief with pain medications in the ED. She endorses associated nausea and emesis since the pain. No fever, chills, congestion, cough, SOB, CP, or bladder/bowel changes. No history of similar pain in the past. No previous abdominal surgeries. She has been NPO since going to sleep last night. Work up in the ED was concerning for leukocytosis and acute appendicitis.   Surgery is consulted by emergency medicine physician Dr. Shaune Pollack, MD in this context for evaluation and management of acute appendicitis.   PAST MEDICAL HISTORY (PMH):  History reviewed. No pertinent past medical history.   PAST SURGICAL HISTORY (PSH):  History reviewed. No pertinent surgical history.   MEDICATIONS:  Prior to Admission medications   Medication Sig Start Date End Date Taking? Authorizing Provider  ibuprofen (ADVIL) 200 MG tablet Take 400-600 mg by mouth every 6 (six) hours as needed for fever or mild pain.   Yes [provider]     ALLERGIES:  No Known Allergies   SOCIAL HISTORY:  Social History   Socioeconomic History  . Marital status: Single    Spouse name: Not on file  . Number of children: Not on file  . Years of education: Not on file  . Highest education level: Not on file  Occupational History  . Not on file  Social Needs  . Financial resource strain: Not on file  . Food insecurity:    Worry: Not on file    Inability: Not on file  . Transportation needs:    Medical: Not on file    Non-medical: Not on file  Tobacco Use  . Smoking status: Never Smoker  . Smokeless tobacco: Never Used   Substance and Sexual Activity  . Alcohol use: Not Currently  . Drug use: Never  . Sexual activity: Not on file  Lifestyle  . Physical activity:    Days per week: Not on file    Minutes per session: Not on file  . Stress: Not on file  Relationships  . Social connections:    Talks on phone: Not on file    Gets together: Not on file    Attends religious service: Not on file    Active member of club or organization: Not on file    Attends meetings of clubs or organizations: Not on file    Relationship status: Not on file  . Intimate partner violence:    Fear of current or ex partner: Not on file    Emotionally abused: Not on file    Physically abused: Not on file    Forced sexual activity: Not on file  Other Topics Concern  . Not on file  Social History Narrative  . Not on file     FAMILY HISTORY:  No family history on file.    REVIEW OF SYSTEMS:  Review of Systems  Constitutional: Negative for chills and fever.  HENT: Negative for congestion and sore throat.   Respiratory: Negative for cough and shortness of breath.   Cardiovascular: Negative for chest pain and palpitations.  Gastrointestinal: Positive for abdominal pain, nausea and vomiting. Negative for constipation and diarrhea.  Genitourinary: Negative  for dysuria and urgency.  All other systems reviewed and are negative.   VITAL SIGNS:  Temp:  [97.9 F (36.6 C)] 97.9 F (36.6 C) (06/02 0645) Pulse Rate:  [91] 91 (06/02 0645) Resp:  [22] 22 (06/02 0645) BP: (124)/(69) 124/69 (06/02 0645) SpO2:  [100 %] 100 % (06/02 0645) Weight:  [52.2 kg] 52.2 kg (06/02 0643)     Height: 5' (152.4 cm) Weight: 52.2 kg BMI (Calculated): 22.46   INTAKE/OUTPUT:  This shift: No intake/output data recorded.  Last 2 shifts: @IOLAST2SHIFTS @   PHYSICAL EXAM:  Physical Exam Vitals signs and nursing note reviewed. Exam conducted with a chaperone present.  Constitutional:      General: She is not in acute distress.    Appearance:  She is well-developed and normal weight. She is not ill-appearing.  HENT:     Head: Normocephalic and atraumatic.  Eyes:     General: No scleral icterus.    Extraocular Movements: Extraocular movements intact.  Cardiovascular:     Rate and Rhythm: Normal rate and regular rhythm.     Heart sounds: Normal heart sounds. No murmur. No friction rub. No gallop.   Pulmonary:     Effort: Pulmonary effort is normal. No respiratory distress.     Breath sounds: Normal breath sounds. No wheezing or rhonchi.  Abdominal:     General: Abdomen is flat. There is no distension.     Palpations: Abdomen is soft.     Tenderness: There is abdominal tenderness in the right lower quadrant. There is no guarding or rebound. Positive signs include Rovsing's sign and McBurney's sign.  Genitourinary:    Comments: Deferred Skin:    General: Skin is warm and dry.     Coloration: Skin is not jaundiced or pale.  Neurological:     General: No focal deficit present.     Mental Status: She is alert and oriented to person, place, and time.  Psychiatric:        Mood and Affect: Mood is anxious.        Behavior: Behavior normal.     Comments: She is somewhat anxious about surgery       Labs:  CBC Latest Ref Rng & Units 01/28/2019 05/15/2018 05/14/2018  WBC 4.0 - 10.5 K/uL 16.5(H) 11.2(H) 13.3(H)  Hemoglobin 12.0 - 15.0 g/dL 05.6 97.9 48.0  Hematocrit 36.0 - 46.0 % 42.6 37.1 39.5  Platelets 150 - 400 K/uL 281 298 281   CMP Latest Ref Rng & Units 01/28/2019 05/15/2018 05/14/2018  Glucose 70 - 99 mg/dL 165(V) - 94  BUN 6 - 20 mg/dL 15 - 13  Creatinine 3.74 - 1.00 mg/dL 8.27 - 0.78  Sodium 675 - 145 mmol/L 136 - 135  Potassium 3.5 - 5.1 mmol/L 3.7 - 3.7  Chloride 98 - 111 mmol/L 103 - 101  CO2 22 - 32 mmol/L 23 - 24  Calcium 8.9 - 10.3 mg/dL 9.4 - 9.3  Total Protein 6.5 - 8.1 g/dL 8.1 4.4(B) 2.0(F)  Total Bilirubin 0.3 - 1.2 mg/dL 1.2 0.0(F) 1.2  Alkaline Phos 38 - 126 U/L 52 65 61  AST 15 - 41 U/L 18 16 15   ALT 0  - 44 U/L 12 12 12      Imaging studies:   CT Abdomen/Pelvis (01/28/2019) personally reviewed, appendix is ill-defined, there is fluid surrounding the appendix/pelvis however felt to be inflammatory, no defined abscess. Radiologist report also reviewed below:  IMPRESSION: 1. Appendix is indistinct. The appendix is felt to be distended  with surrounding fluid. Appearance is felt to be indicative of a degree of appendicitis.  Appendix: Location: Arising inferomedial from the cecum extending inferior into the mid pelvis from a right lateral approach. Diameter: Up to 11 mm. Note that the appendiceal wall wall is indistinct. Appendicolith: Somewhat amorphous calcification toward the distal appendix measuring 1.2 x 1.1 cm. Well-defined appendicolith not seen. Mucosal hyper-enhancement: None Extraluminal gas: None Periappendiceal collection: Fluid tracks from the appendix into the pelvis. No apparent abscess.  2. No bowel obstruction. No abscess in the abdomen or pelvis. No free air evident. 3. No evident renal or ureteral calculus. No hydronephrosis. Urinary bladder wall thickness normal.   Assessment/Plan: (ICD-10's: K35.80) 18 y.o. female with leukocytosis and acute onset of severe lower abdominal pain attributable to acute appendicitis   - Admit to general surgery  - NPO + IVF + IV ABx (Ceftriaxone + Flagyl)  - pain control prn; antiemetics prn  - monitor abdominal examination  - Will plan for laparoscopic appendectomy with Dr Earlene Plateravis today pending OR/Anesthesia availability  - *All risks, benefits, and alternatives to the above procedure(s) were discussed with the patient, all of her questions were answered to her expressed satisfaction, patient expresses she wishes to proceed, and informed consent was obtained.  - Medical management of comorbidities    - DVT prophylaxis; hold for surgery  All of the above findings and recommendations were discussed with the patient and the  medical team, and all of patient's questions were answered to her expressed satisfaction.  Thank you for the opportunity to participate in this patient's care.   -- Lynden OxfordZachary Kashmere Staffa, PA-C  [facility] 01/28/2019, 9:53 AM [phone] M-F: 7am - 4pm

## 2019-01-29 ENCOUNTER — Encounter: Payer: Self-pay | Admitting: Surgery

## 2019-01-29 NOTE — Discharge Summary (Signed)
[facility] SURGICAL DISCHARGE SUMMARY  Patient ID: Candice Villarreal MRN: [id number] DOB/AGE: 2000/12/25 18 y.o.  Admit date: 01/28/2019 Discharge date: 01/29/2019  Discharge Diagnoses Patient Active Problem List   Diagnosis Date Noted  . Acute appendicitis 01/28/2019    Consultants None  Procedures 01/29/2019:  Laparoscopic appendectomy  HPI: 18 y.o. female presented to [facility] ED 06/02 for evaluation of abdominal pain. Patient reports the acute onset of sharp lower/suprapubic abdominal pain around 2 AM this morning. The pain woke her from sleep. It was been constant and severe since the onset. She has gotten some relief with pain medications in the ED. She endorses associated nausea and emesis since the pain. No fever, chills, congestion, cough, SOB, CP, or bladder/bowel changes. No history of similar pain in the past. No previous abdominal surgeries. She has been NPO since going to sleep last night. Work up in the ED was concerning for leukocytosis and acute appendicitis.   Hospital Course: Informed consent was obtained and documented, and patient underwent uneventful laparoscopic appendectomy (Dr Earlene Plater, 01/28/2019).  Post-operatively, patient's pain improved/resolved and advancement of patient's diet and ambulation were well-tolerated. The remainder of patient's hospital course was essentially unremarkable, and discharge planning was initiated accordingly with patient safely able to be discharged home with appropriate discharge instructions, pain control, and outpatient follow-up after all of her questions were answered to her expressed satisfaction.   Discharge Condition: Good   Physical Examination:  Constitutional: Well appearing female, NAD HEENT: No scleral icterus, EOM intact Pulmonary: Normal effort, no respiratory distress Gastrointestinal: soft, incisional soreness, non-distended, no rebound/guarding Skin: Laparoscopic incisions are CDI with skin glue, no  erythema or drainage    Allergies as of 01/29/2019   No Known Allergies     Medication List    TAKE these medications   ibuprofen 200 MG tablet Commonly known as:  ADVIL Take 400-600 mg by mouth every 6 (six) hours as needed for fever or mild pain.   oxyCODONE-acetaminophen 5-325 MG tablet Commonly known as:  PERCOCET/ROXICET Take 1 tablet by mouth every 4 (four) hours as needed for severe pain.        Follow-up Information    Primary care doctor.   Contact information: Follow-up with a primary care doctor in 3 to 5 days as needed       [facility].   Specialty:  Primary Care Contact information: [address] [place] [address] [phone]       Ancil Linsey, MD. Schedule an appointment as soon as possible for a visit in 2 week(s).   Specialty:  General Surgery Contact information: [address] [place] [address] [phone]            Time spent on discharge management including discussion of hospital course, clinical condition, outpatient instructions, prescriptions, and follow up with the patient and members of the medical team: >30 minutes  -- Lynden Oxford , PA-C [facility]  01/29/2019, 9:40 AM [phone] M-F: 7am - 4pm

## 2019-01-29 NOTE — TOC Transition Note (Signed)
Transition of Care [facility]) - CM/SW Discharge Note   Patient Details  Name: Candice Villarreal MRN: [id number] Date of Birth: 18-Jan-2001  [facility]) CM/SW Contact:  Chapman Fitch, RN Phone Number: 01/29/2019, 2:28 PM   Clinical Narrative:    Patient status post lap appy.  Patient lives at home with her parents and fiance.  Patient states that she is employed and has reliable transportation.     Patient does not have PCP.  Patient resides in [facility] and  Not a candidate for [employer] .  Patient agreeable for appointment at [facility].  Appointment made for 02/07/19.  Patient to discharge with Rx for percocet.  Patient will be using [facility] pharmacy, there are not goodrx coupons available.  Patient aware  Final next level of care: Home/Self Care Barriers to Discharge: Barriers Resolved   Patient Goals and CMS Choice        Discharge Placement                       Discharge Plan and Services   Discharge Planning Services: CM Consult, [facility], Medication Assistance                                 Social Determinants of Health (SDOH) Interventions     Readmission Risk Interventions No flowsheet data found.

## 2019-01-30 LAB — SURGICAL PATHOLOGY

## 2019-02-13 ENCOUNTER — Encounter: Payer: Self-pay | Admitting: Surgery

## 2019-02-13 ENCOUNTER — Ambulatory Visit (INDEPENDENT_AMBULATORY_CARE_PROVIDER_SITE_OTHER): Payer: Self-pay | Admitting: Surgery

## 2019-02-13 ENCOUNTER — Other Ambulatory Visit: Payer: Self-pay

## 2019-02-13 VITALS — BP 106/72 | HR 72 | Temp 98.1°F | Ht 60.0 in | Wt 117.0 lb

## 2019-02-13 DIAGNOSIS — Z4889 Encounter for other specified surgical aftercare: Secondary | ICD-10-CM

## 2019-02-13 DIAGNOSIS — K3580 Unspecified acute appendicitis: Secondary | ICD-10-CM

## 2019-02-13 NOTE — Patient Instructions (Addendum)
The patient is aware to call back for any questions or new concerns.  GENERAL POST-OPERATIVE PATIENT INSTRUCTIONS   WOUND CARE INSTRUCTIONS:  Keep a dry clean dressing on the wound if there is drainage. The initial bandage may be removed after 24 hours.  Once the wound has quit draining you may leave it open to air.  If clothing rubs against the wound or causes irritation and the wound is not draining you may cover it with a dry dressing during the daytime.  Try to keep the wound dry and avoid ointments on the wound unless directed to do so.  If the wound becomes bright red and painful or starts to drain infected material that is not clear, please contact your physician immediately.  If the wound is mildly pink and has a thick firm ridge underneath it, this is normal, and is referred to as a healing ridge.  This will resolve over the next 4-6 weeks.  BATHING: You may shower if you have been informed of this by your surgeon. However, Please do not submerge in a tub, hot tub, or pool until incisions are completely sealed or have been told by your surgeon that you may do so.  DIET:  You may eat any foods that you can tolerate.  It is a good idea to eat a high fiber diet and take in plenty of fluids to prevent constipation.  If you do become constipated you may want to take a mild laxative or take ducolax tablets on a daily basis until your bowel habits are regular.  Constipation can be very uncomfortable, along with straining, after recent surgery.  ACTIVITY:  You are encouraged to cough and deep breath or use your incentive spirometer if you were given one, every 15-30 minutes when awake.  This will help prevent respiratory complications and low grade fevers post-operatively if you had a general anesthetic.  You may want to hug a pillow when coughing and sneezing to add additional support to the surgical area, if you had abdominal or chest surgery, which will decrease pain during these times.  You are  encouraged to walk and engage in light activity for the next two weeks.   No heavy lifting over 40 pounds for 2 more weeks.  At that time- Listen to your body when lifting, if you have pain when lifting, stop and then try again in a few days. Soreness after doing exercises or activities of daily living is normal as you get back in to your normal routine.  MEDICATIONS:  Try to take narcotic medications and anti-inflammatory medications, such as tylenol, ibuprofen, naprosyn, etc., with food.  This will minimize stomach upset from the medication.  Should you develop nausea and vomiting from the pain medication, or develop a rash, please discontinue the medication and contact your physician.  You should not drive, make important decisions, or operate machinery when taking narcotic pain medication.  SUNBLOCK Use sun block to incision area over the next year if this area will be exposed to sun. This helps decrease scarring and will allow you avoid a permanent darkened area over your incision.  QUESTIONS:  Please feel free to call our office if you have any questions, and we will be glad to assist you.

## 2019-02-16 NOTE — Progress Notes (Signed)
Surgical Clinic Progress/Follow-up Note   HPI:  18 y.o. Female presents to clinic for post-op follow-up 16 Days s/p laparoscopic appendectomy Rosana Hoes, 01/28/2019) for acute non-perforated appendicitis with localized peritonitis. Patient reports complete resolution of pre- and peri-operative pain and has been tolerating regular diet with +flatus and normal BM's, denies N/V, fever/chills, CP, or SOB.  Review of Systems:  Constitutional: denies fever/chills  Respiratory: denies shortness of breath, wheezing  Cardiovascular: denies chest pain, palpitations  Gastrointestinal: abdominal pain, N/V, and bowel function as per interval history Skin: Denies any other rashes or skin discolorations except post-surgical wounds as per interval history  Vital Signs:  BP 106/72   Pulse 72   Temp 98.1 F (36.7 C) (Temporal)   Ht 5' (1.524 m)   Wt 117 lb (53.1 kg)   LMP 01/29/2019   SpO2 99%   BMI 22.85 kg/m    Physical Exam:  Constitutional:  -- Normal body habitus  -- Awake, alert, and oriented x3  Pulmonary:  -- No crackles -- Equal breath sounds bilaterally -- Breathing non-labored at rest Cardiovascular:  -- S1, S2 present  -- No pericardial rubs  Gastrointestinal:  -- Soft and non-distended, non-tender to palpation, no guarding/rebound tenderness -- Post-surgical incisions all well-approximated without any peri-incisional erythema or drainage -- No abdominal masses appreciated, pulsatile or otherwise  Musculoskeletal / Integumentary:  -- Wounds or skin discoloration: None appreciated except post-surgical incisions as described above (GI) -- Extremities: B/L UE and LE FROM, hands and feet warm, no edema   Imaging: No new pertinent imaging available for review   Assessment:  18 y.o. yo Female with a problem list including...  Patient Active Problem List   Diagnosis Date Noted  . Acute appendicitis 01/28/2019    presents to clinic for post-op follow-up evaluation, doing well 16  Days s/p laparoscopic appendectomy Rosana Hoes, 01/28/2019) for acute non-perforated appendicitis with localized peritonitis.  Plan:              - advance diet as tolerated              - okay to submerge incisions under water (baths, swimming) prn             - gradually resume all activities without restrictions over next 2 weeks             - apply sunblock particularly to incisions with sun exposure to reduce pigmentation of scars             - return to clinic as needed, instructed to call office if any questions or concerns  All of the above recommendations were discussed with the patient and patient's family/friend, and all of patient's and family/friend's questions were answered to their expressed satisfaction.  -- Marilynne Drivers Rosana Hoes, MD, [place]: [facility] for exceptional care. Office: [phone]

## 2019-02-19 ENCOUNTER — Encounter: Payer: Self-pay | Admitting: Surgery

## 2019-04-13 ENCOUNTER — Encounter: Payer: Self-pay | Admitting: Intensive Care

## 2019-04-13 ENCOUNTER — Emergency Department
Admission: EM | Admit: 2019-04-13 | Discharge: 2019-04-13 | Disposition: A | Discharge status: Home / Self Care | Payer: Self-pay | Attending: Emergency Medicine | Admitting: Emergency Medicine

## 2019-04-13 ENCOUNTER — Other Ambulatory Visit: Payer: Self-pay

## 2019-04-13 DIAGNOSIS — K529 Noninfective gastroenteritis and colitis, unspecified: Secondary | ICD-10-CM | POA: Insufficient documentation

## 2019-04-13 DIAGNOSIS — R112 Nausea with vomiting, unspecified: Secondary | ICD-10-CM

## 2019-04-13 DIAGNOSIS — R197 Diarrhea, unspecified: Secondary | ICD-10-CM | POA: Insufficient documentation

## 2019-04-13 LAB — URINALYSIS, COMPLETE (UACMP) WITH MICROSCOPIC
Bacteria, UA: NONE SEEN
Bilirubin Urine: NEGATIVE
Glucose, UA: NEGATIVE mg/dL
Hgb urine dipstick: NEGATIVE
Ketones, ur: NEGATIVE mg/dL
Leukocytes,Ua: NEGATIVE
Nitrite: NEGATIVE
Protein, ur: NEGATIVE mg/dL
Specific Gravity, Urine: 1.008 (ref 1.005–1.030)
pH: 6 (ref 5.0–8.0)

## 2019-04-13 LAB — COMPREHENSIVE METABOLIC PANEL
ALT: 11 U/L (ref 0–44)
AST: 15 U/L (ref 15–41)
Albumin: 4.1 g/dL (ref 3.5–5.0)
Alkaline Phosphatase: 43 U/L (ref 38–126)
Anion gap: 7 (ref 5–15)
BUN: 15 mg/dL (ref 6–20)
CO2: 22 mmol/L (ref 22–32)
Calcium: 8.8 mg/dL — ABNORMAL LOW (ref 8.9–10.3)
Chloride: 105 mmol/L (ref 98–111)
Creatinine, Ser: 0.66 mg/dL (ref 0.44–1.00)
GFR calc Af Amer: >60 mL/min (ref >60)
GFR calc non Af Amer: >60 mL/min (ref >60)
Glucose, Bld: 92 mg/dL (ref 70–99)
Potassium: 4 mmol/L (ref 3.5–5.1)
Sodium: 134 mmol/L — ABNORMAL LOW (ref 135–145)
Total Bilirubin: 0.8 mg/dL (ref 0.3–1.2)
Total Protein: 7.4 g/dL (ref 6.5–8.1)

## 2019-04-13 LAB — LIPASE, BLOOD: Lipase: 28 U/L (ref 11–51)

## 2019-04-13 LAB — CBC
HCT: 39 % (ref 36.0–46.0)
Hemoglobin: 13.1 g/dL (ref 12.0–15.0)
MCH: 28.8 pg (ref 26.0–34.0)
MCHC: 33.6 g/dL (ref 30.0–36.0)
MCV: 85.7 fL (ref 80.0–100.0)
Platelets: 238 10*3/uL (ref 150–400)
RBC: 4.55 MIL/uL (ref 3.87–5.11)
RDW: 12.2 % (ref 11.5–15.5)
WBC: 9.6 10*3/uL (ref 4.0–10.5)
nRBC: 0 % (ref 0.0–0.2)

## 2019-04-13 LAB — PREGNANCY, URINE: Preg Test, Ur: NEGATIVE

## 2019-04-13 MED ORDER — LACTATED RINGERS IV BOLUS
1000.0000 mL | Freq: Once | INTRAVENOUS | Status: AC
  Administered 2019-04-13: 1000 mL via INTRAVENOUS

## 2019-04-13 MED ORDER — ONDANSETRON 4 MG PO TBDP: 4.0000 mg | ORAL_TABLET | Freq: Three times a day (TID) | ORAL | 0 refills | Status: DC | PRN

## 2019-04-13 MED ORDER — ONDANSETRON HCL 4 MG/2ML IJ SOLN
4.0000 mg | Freq: Once | INTRAMUSCULAR | Status: AC
  Administered 2019-04-13: 4 mg via INTRAVENOUS
  Filled 2019-04-13: qty 2

## 2019-04-13 NOTE — ED Notes (Signed)
This RN and Claiborne Billings, Rn to beside, UA collected per MD order. IV restarted by this RN to L AC. Pt tolerated well, fluids restarted to IV to L AC. Will continue to monitor for further patient needs.

## 2019-04-13 NOTE — ED Notes (Signed)
Pt resting in bed, playing on phone. NAD. VSS. Fluids continuing to run. Denies needing anything. Will continue to monitor for further needs.

## 2019-04-13 NOTE — ED Notes (Signed)
This RN checked on pt.

## 2019-04-13 NOTE — ED Triage Notes (Signed)
Patient c/o Nausea X 1 week. Emesis started yesterday

## 2019-04-13 NOTE — ED Provider Notes (Signed)
[facility] Emergency Department Provider Note   ____________________________________________   First MD Initiated Contact with Patient 04/13/19 [phone]     (approximate)  I have reviewed the triage vital signs and the nursing notes.   HISTORY  Chief Complaint Emesis and Nausea    HPI Candice Villarreal is a 18 y.o. female with no significant past medical history presents to the ED complaining of nausea and vomiting.  Patient reports she has been feeling nauseous for about the past week with a poor appetite.  This became more severe this morning and she had to run to the bathroom to vomit once.  Emesis was nonbilious and nonbloody.  She has had diarrhea as well, but denies any abdominal pain.  She had an appendectomy performed about 2 months ago and states that she has been doing well since then.  She denies any dysuria, hematuria, flank pain.        Past Medical History:  Diagnosis Date  . Acute appendicitis 01/28/2019    There are no active problems to display for this patient.   Past Surgical History:  Procedure Laterality Date  . LAPAROSCOPIC APPENDECTOMY N/A 01/28/2019   Procedure: APPENDECTOMY LAPAROSCOPIC;  Surgeon: Vickie Epley, MD;  Location: [facility] ORS;  Service: General;  Laterality: N/A;    Prior to Admission medications   Medication Sig Start Date End Date Taking? Authorizing Provider  ondansetron (ZOFRAN ODT) 4 MG disintegrating tablet Take 1 tablet (4 mg total) by mouth every 8 (eight) hours as needed for nausea or vomiting. 04/13/19   Blake Divine, MD    Allergies Patient has no known allergies.  History reviewed. No pertinent family history.  Social History Social History   Tobacco Use  . Smoking status: Never Smoker  . Smokeless tobacco: Never Used  Substance Use Topics  . Alcohol use: Not Currently  . Drug use: Never    Review of Systems  Constitutional: No fever/chills Eyes: No visual changes. ENT: No sore  throat. Cardiovascular: Denies chest pain. Respiratory: Denies shortness of breath. Gastrointestinal: No abdominal pain.  Positive for nausea and vomiting.  No diarrhea.  No constipation. Genitourinary: Negative for dysuria. Musculoskeletal: Negative for back pain. Skin: Negative for rash. Neurological: Negative for headaches, focal weakness or numbness.  ____________________________________________   PHYSICAL EXAM:  VITAL SIGNS: ED Triage Vitals  Enc Vitals Group     BP 04/13/19 0839 (!) 119/92     Pulse Rate 04/13/19 0839 (!) 56     Resp 04/13/19 0839 16     Temp 04/13/19 0839 99.3 F (37.4 C)     Temp Source 04/13/19 0839 Oral     SpO2 04/13/19 0839 98 %     Weight 04/13/19 0840 115 lb (52.2 kg)     Height 04/13/19 0840 5' (1.524 m)     Head Circumference --      Peak Flow --      Pain Score 04/13/19 0840 0     Pain Loc --      Pain Edu? --      Excl. in [place]? --     Constitutional: Alert and oriented. Eyes: Conjunctivae are normal. Head: Atraumatic. Nose: No congestion/rhinnorhea. Mouth/Throat: Mucous membranes are moist. Neck: Normal ROM Cardiovascular: Normal rate, regular rhythm. Grossly normal heart sounds. Respiratory: Normal respiratory effort.  No retractions. Lungs CTAB. Gastrointestinal: Soft and nontender. No distention. Genitourinary: deferred Musculoskeletal: No lower extremity tenderness nor edema. Neurologic:  Normal speech and language. No gross focal neurologic deficits  are appreciated. Skin:  Skin is warm, dry and intact. No rash noted. Psychiatric: Mood and affect are normal. Speech and behavior are normal.  ____________________________________________   LABS (all labs ordered are listed, but only abnormal results are displayed)  Labs Reviewed  COMPREHENSIVE METABOLIC PANEL - Abnormal; Notable for the following components:      Result Value   Sodium 134 (*)    Calcium 8.8 (*)    All other components within normal limits  URINALYSIS,  COMPLETE (UACMP) WITH MICROSCOPIC - Abnormal; Notable for the following components:   Color, Urine STRAW (*)    APPearance CLEAR (*)    All other components within normal limits  LIPASE, BLOOD  CBC  PREGNANCY, URINE  POC URINE PREG, ED     PROCEDURES  Procedure(s) performed (including Critical Care):  Procedures   ____________________________________________   INITIAL IMPRESSION / ASSESSMENT AND PLAN / ED COURSE       18 year old female presenting with approximately 1 week of nausea and vomiting this morning with diarrhea over the past couple of days.  She has benign abdominal exam, is status post appendectomy, doubt acute intra-abdominal process.  Appears most consistent with viral gastroenteritis, no blood in her stool or vomit and no recent travel to suggest bacterial source.  Will check labs, hydrate, treat with Zofran.  Suspect patient will be appropriate for discharge home with symptomatic treatment.  Pregnancy testing negative, UA unremarkable.  Labs unremarkable, electrolytes within normal limits.  Patient feeling much better after Zofran and IV fluid bolus, will discharge home with symptomatic treatment.  Counseled to return to the ED for new or worsening symptoms, patient agrees with plan.      ____________________________________________   FINAL CLINICAL IMPRESSION(S) / ED DIAGNOSES  Final diagnoses:  Gastroenteritis  Non-intractable vomiting with nausea, unspecified vomiting type     ED Discharge Orders         Ordered    ondansetron (ZOFRAN ODT) 4 MG disintegrating tablet  Every 8 hours PRN     04/13/19 1404           Note:  This document was prepared using Dragon voice recognition software and may include unintentional dictation errors.   Chesley NoonJessup, Luke Rigsbee, MD 04/13/19 [phone]

## 2019-09-23 ENCOUNTER — Ambulatory Visit: Payer: Medicaid Other | Attending: Internal Medicine

## 2019-09-23 DIAGNOSIS — Z20822 Contact with and (suspected) exposure to covid-19: Secondary | ICD-10-CM | POA: Insufficient documentation

## 2019-09-24 LAB — NOVEL CORONAVIRUS, NAA: SARS-CoV-2, NAA: NOT DETECTED

## 2019-10-15 IMAGING — CT CT HEAD W/O CM
3 series · 16 of 43 positions shown, 19 images · non-contrast
Comparison: None.

CLINICAL DATA: Headache, vomiting, and fever since last week. Seen
here last week for right corneal abrasion and subconjunctival
hemorrhage.

EXAM:
CT HEAD WITHOUT CONTRAST
TECHNIQUE: Contiguous axial images were obtained from the base of the skull
through the vertex without intravenous contrast.

[Series 2: head wo · axial · 0.37mm/px · z∈[-113,-8]mm · 10 of 26 slices shown, 13 images]
[im 3/26  brain]
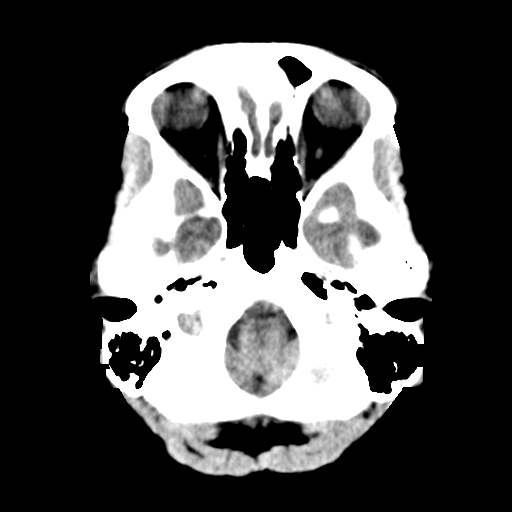
[im 3/26  bone]
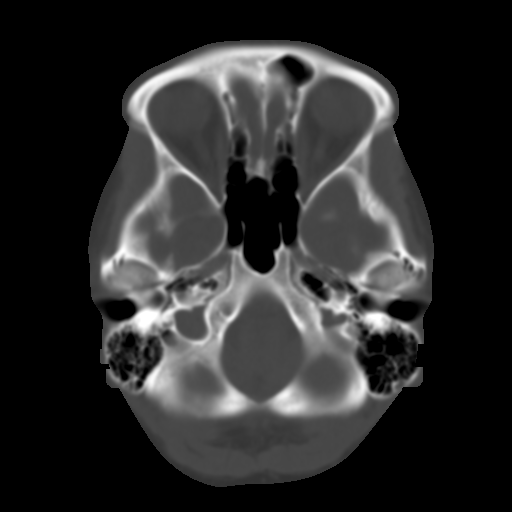
[im 5/26  brain]
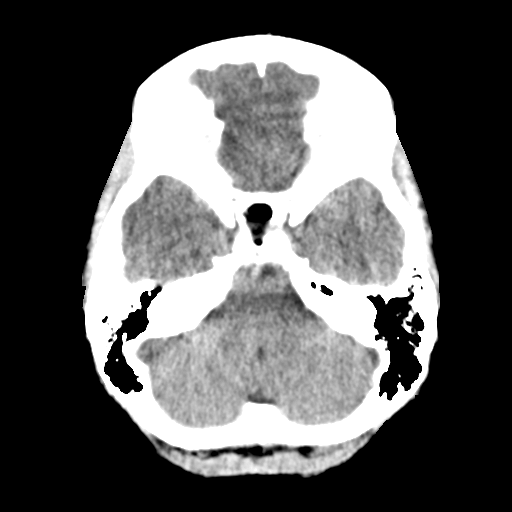
[im 8/26  brain]
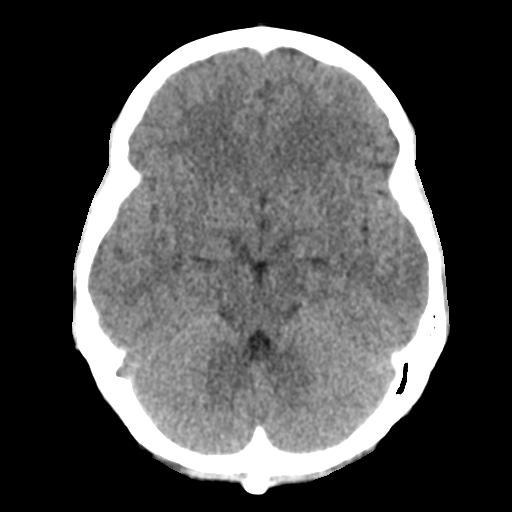
[im 10/26  brain]
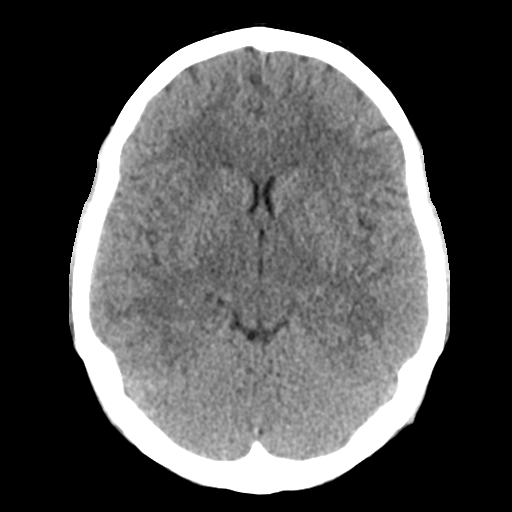
[im 12/26  brain]
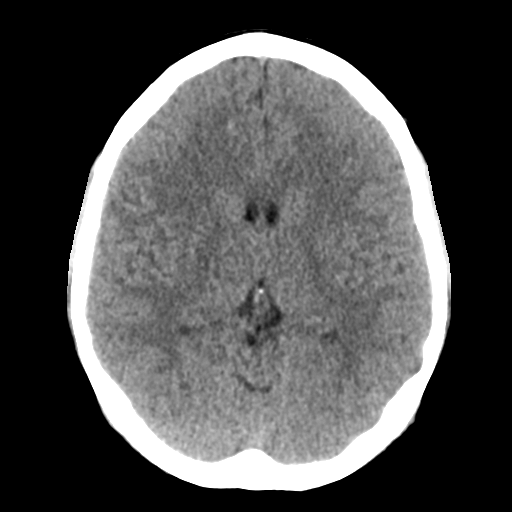
[im 12/26  bone]
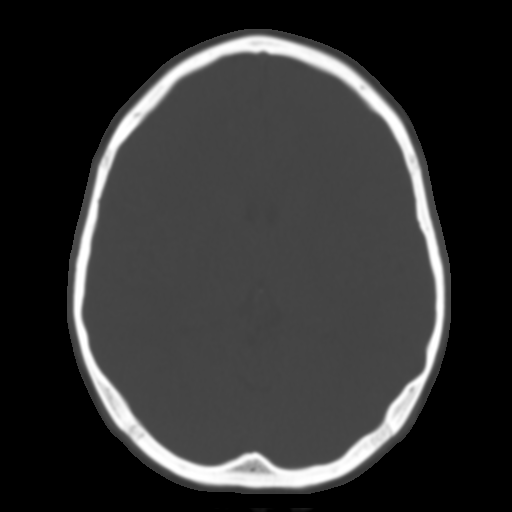
[im 15/26  brain]
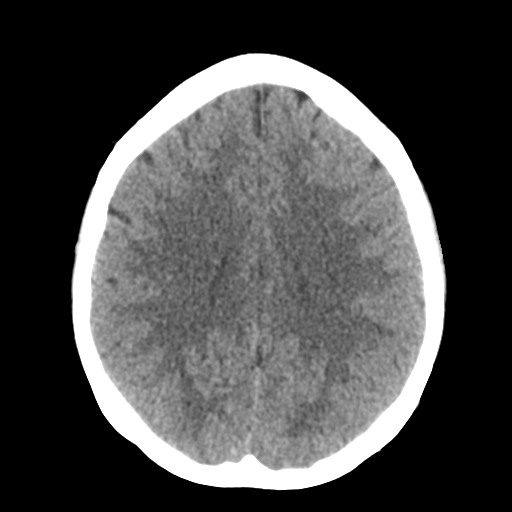
[im 17/26  brain]
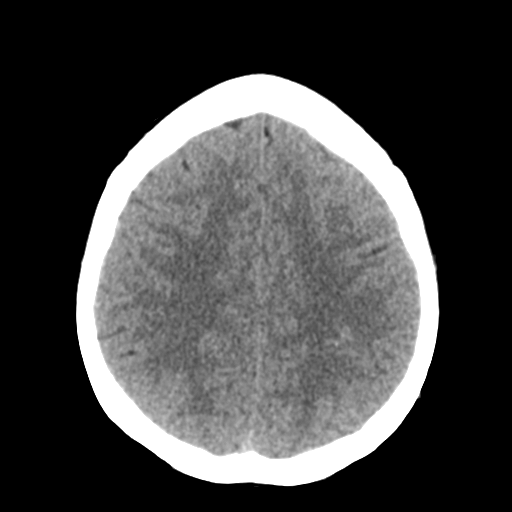
[im 20/26  brain]
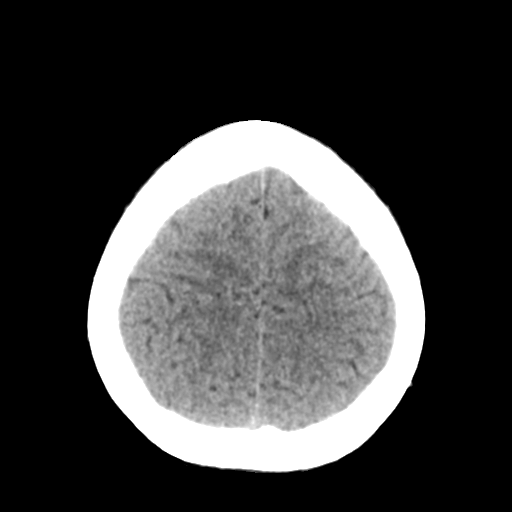
[im 22/26  brain]
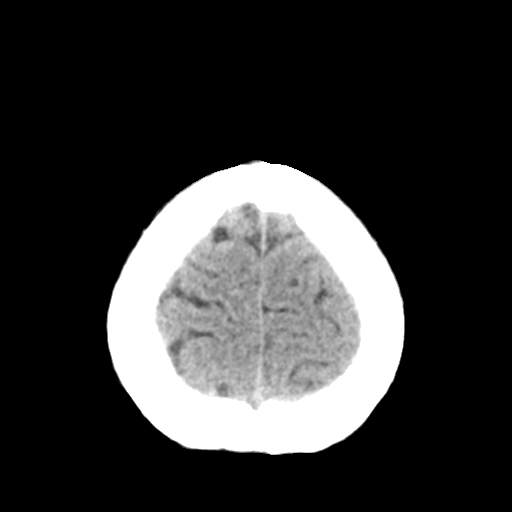
[im 22/26  bone]
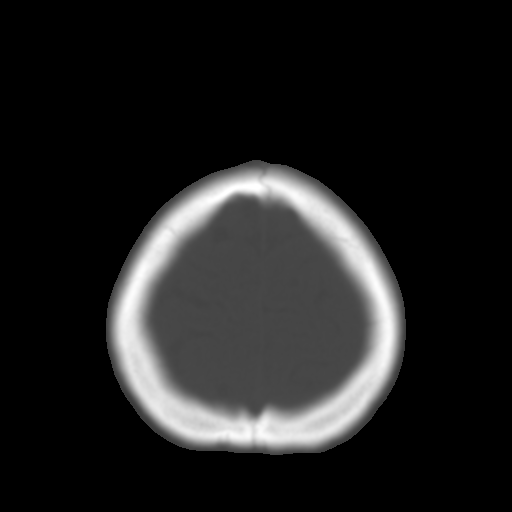
[im 24/26  brain]
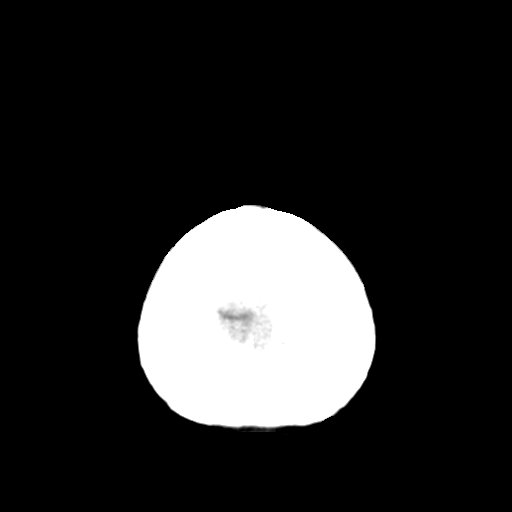

[Series 4: coronal soft tissue · coronal · 0.29mm/px · 3 of 57 slices shown]
[im 19/57  brain]
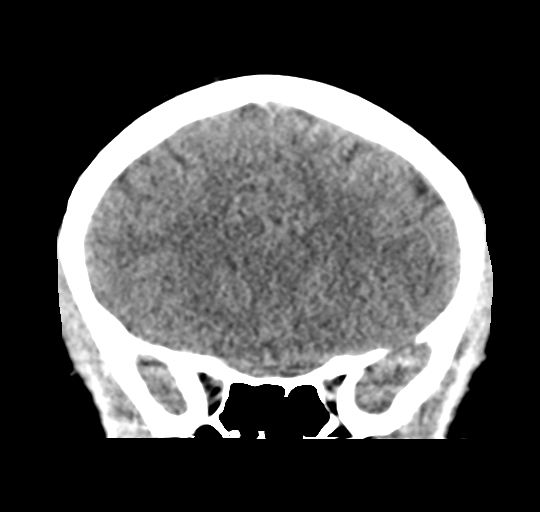
[im 25/57  brain]
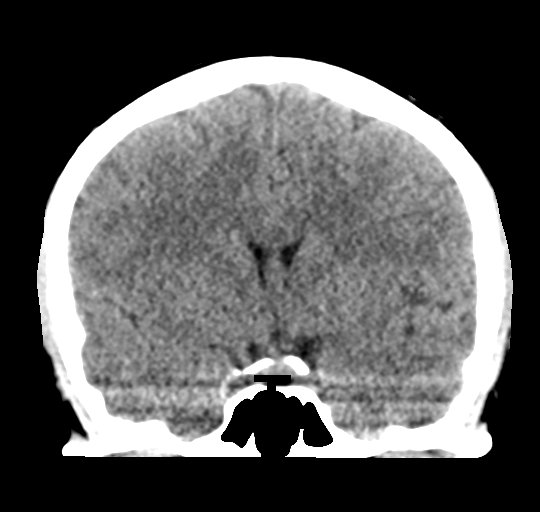
[im 32/57  brain]
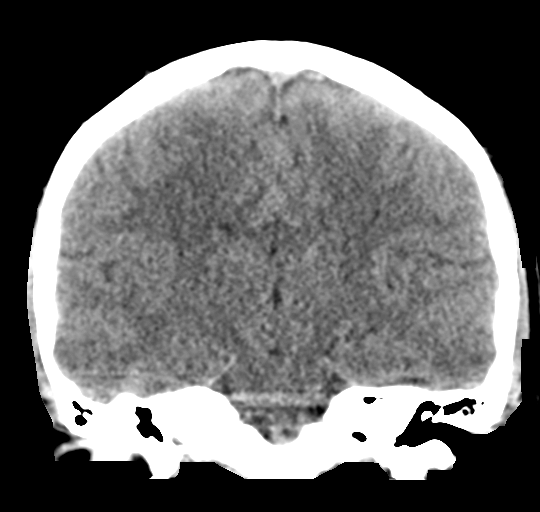

[Series 5: sagittal soft tissue · sagittal · 0.27mm/px · 3 of 49 slices shown]
[im 17/49  brain]
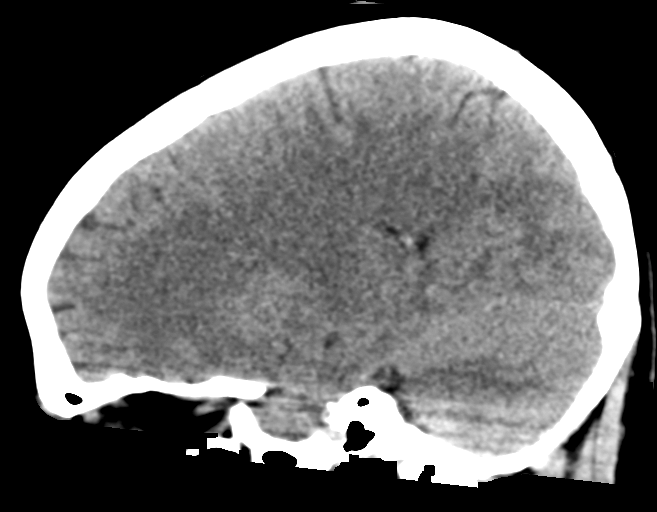
[im 25/49  brain]
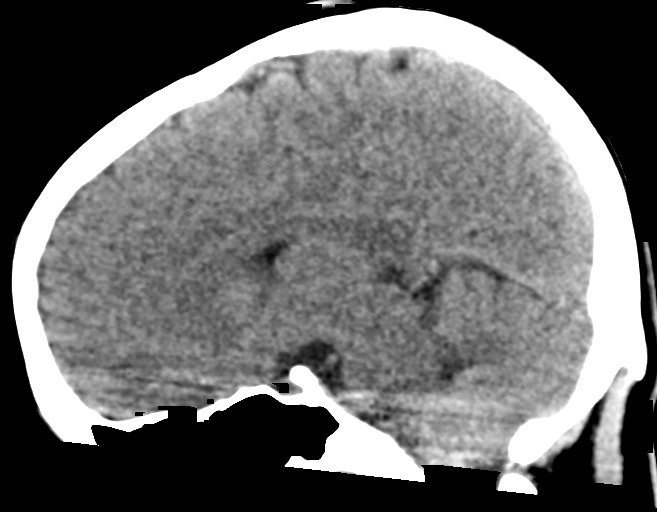
[im 33/49  brain]
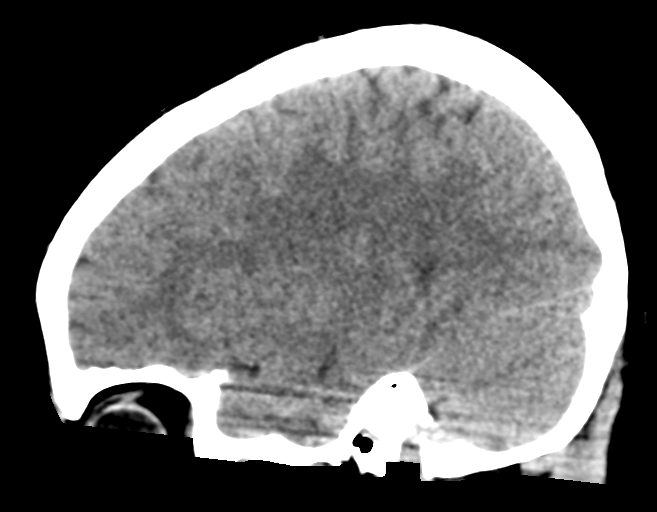

[16 of 43 positions shown; findings below may reference images not displayed]

FINDINGS: Brain: No evidence of acute infarction, hemorrhage, hydrocephalus,
extra-axial collection or mass lesion/mass effect.

Vascular: No hyperdense vessel or unexpected calcification.

Skull: Calvarium appears intact.

Sinuses/Orbits: Paranasal sinuses and mastoid air cells are clear.

Other: None.
IMPRESSION: Normal head CT.

## 2019-10-19 ENCOUNTER — Other Ambulatory Visit: Payer: Self-pay

## 2019-10-19 ENCOUNTER — Emergency Department: Payer: Medicaid Other

## 2019-10-19 ENCOUNTER — Inpatient Hospital Stay
Admission: EM | Admit: 2019-10-19 | Discharge: 2019-10-21 | DRG: 062 | Disposition: A | Discharge status: Home / Self Care | Payer: Medicaid Other | Attending: Internal Medicine | Admitting: Internal Medicine

## 2019-10-19 ENCOUNTER — Observation Stay: Payer: Medicaid Other

## 2019-10-19 DIAGNOSIS — F17200 Nicotine dependence, unspecified, uncomplicated: Secondary | ICD-10-CM | POA: Diagnosis present

## 2019-10-19 DIAGNOSIS — R29898 Other symptoms and signs involving the musculoskeletal system: Secondary | ICD-10-CM

## 2019-10-19 DIAGNOSIS — Z9282 Status post administration of tPA (rtPA) in a different facility within the last 24 hours prior to admission to current facility: Secondary | ICD-10-CM

## 2019-10-19 DIAGNOSIS — G459 Transient cerebral ischemic attack, unspecified: Secondary | ICD-10-CM

## 2019-10-19 DIAGNOSIS — Z79899 Other long term (current) drug therapy: Secondary | ICD-10-CM

## 2019-10-19 DIAGNOSIS — Z20822 Contact with and (suspected) exposure to covid-19: Secondary | ICD-10-CM | POA: Diagnosis present

## 2019-10-19 DIAGNOSIS — Q211 Atrial septal defect: Secondary | ICD-10-CM

## 2019-10-19 DIAGNOSIS — R29810 Facial weakness: Secondary | ICD-10-CM | POA: Diagnosis present

## 2019-10-19 DIAGNOSIS — G8321 Monoplegia of upper limb affecting right dominant side: Secondary | ICD-10-CM | POA: Diagnosis present

## 2019-10-19 DIAGNOSIS — R4701 Aphasia: Secondary | ICD-10-CM | POA: Diagnosis present

## 2019-10-19 DIAGNOSIS — R531 Weakness: Secondary | ICD-10-CM | POA: Diagnosis present

## 2019-10-19 DIAGNOSIS — Q2112 Patent foramen ovale: Secondary | ICD-10-CM

## 2019-10-19 DIAGNOSIS — Z833 Family history of diabetes mellitus: Secondary | ICD-10-CM

## 2019-10-19 DIAGNOSIS — R29705 NIHSS score 5: Secondary | ICD-10-CM | POA: Diagnosis present

## 2019-10-19 DIAGNOSIS — G43109 Migraine with aura, not intractable, without status migrainosus: Secondary | ICD-10-CM | POA: Diagnosis present

## 2019-10-19 DIAGNOSIS — F17210 Nicotine dependence, cigarettes, uncomplicated: Secondary | ICD-10-CM | POA: Diagnosis present

## 2019-10-19 DIAGNOSIS — I639 Cerebral infarction, unspecified: Principal | ICD-10-CM | POA: Diagnosis present

## 2019-10-19 DIAGNOSIS — R200 Anesthesia of skin: Secondary | ICD-10-CM | POA: Diagnosis present

## 2019-10-19 DIAGNOSIS — Z823 Family history of stroke: Secondary | ICD-10-CM

## 2019-10-19 DIAGNOSIS — R202 Paresthesia of skin: Secondary | ICD-10-CM

## 2019-10-19 DIAGNOSIS — R471 Dysarthria and anarthria: Secondary | ICD-10-CM | POA: Diagnosis present

## 2019-10-19 DIAGNOSIS — I253 Aneurysm of heart: Secondary | ICD-10-CM | POA: Diagnosis present

## 2019-10-19 HISTORY — DX: Nicotine dependence, unspecified, uncomplicated: F17.200

## 2019-10-19 HISTORY — DX: Other symptoms and signs involving the musculoskeletal system: R29.898

## 2019-10-19 HISTORY — DX: Anesthesia of skin: R20.0

## 2019-10-19 HISTORY — DX: Aphasia: R47.01

## 2019-10-19 LAB — CBC
HCT: 45.4 % (ref 36.0–46.0)
Hemoglobin: 15.6 g/dL — ABNORMAL HIGH (ref 12.0–15.0)
MCH: 29.2 pg (ref 26.0–34.0)
MCHC: 34.4 g/dL (ref 30.0–36.0)
MCV: 84.9 fL (ref 80.0–100.0)
Platelets: 294 10*3/uL (ref 150–400)
RBC: 5.35 MIL/uL — ABNORMAL HIGH (ref 3.87–5.11)
RDW: 11.9 % (ref 11.5–15.5)
WBC: 14.1 10*3/uL — ABNORMAL HIGH (ref 4.0–10.5)
nRBC: 0 % (ref 0.0–0.2)

## 2019-10-19 LAB — DIFFERENTIAL
Abs Immature Granulocytes: 0.06 10*3/uL (ref 0.00–0.07)
Basophils Absolute: 0.1 10*3/uL (ref 0.0–0.1)
Basophils Relative: 1 %
Eosinophils Absolute: 0.1 10*3/uL (ref 0.0–0.5)
Eosinophils Relative: 0 %
Immature Granulocytes: 0 %
Lymphocytes Relative: 19 %
Lymphs Abs: 2.7 10*3/uL (ref 0.7–4.0)
Monocytes Absolute: 0.6 10*3/uL (ref 0.1–1.0)
Monocytes Relative: 4 %
Neutro Abs: 10.7 10*3/uL — ABNORMAL HIGH (ref 1.7–7.7)
Neutrophils Relative %: 76 %

## 2019-10-19 LAB — PROTIME-INR
INR: 0.9 (ref 0.8–1.2)
Prothrombin Time: 11.7 seconds (ref 11.4–15.2)

## 2019-10-19 LAB — URINE DRUG SCREEN, QUALITATIVE (ARMC ONLY)
Amphetamines, Ur Screen: NOT DETECTED
Barbiturates, Ur Screen: NOT DETECTED
Benzodiazepine, Ur Scrn: NOT DETECTED
Cannabinoid 50 Ng, Ur ~~LOC~~: POSITIVE — AB
Cocaine Metabolite,Ur ~~LOC~~: NOT DETECTED
MDMA (Ecstasy)Ur Screen: NOT DETECTED
Methadone Scn, Ur: NOT DETECTED
Opiate, Ur Screen: NOT DETECTED
Phencyclidine (PCP) Ur S: NOT DETECTED
Tricyclic, Ur Screen: NOT DETECTED

## 2019-10-19 LAB — COMPREHENSIVE METABOLIC PANEL
ALT: 12 U/L (ref 0–44)
AST: 17 U/L (ref 15–41)
Albumin: 4.3 g/dL (ref 3.5–5.0)
Alkaline Phosphatase: 48 U/L (ref 38–126)
Anion gap: 8 (ref 5–15)
BUN: 13 mg/dL (ref 6–20)
CO2: 26 mmol/L (ref 22–32)
Calcium: 9 mg/dL (ref 8.9–10.3)
Chloride: 104 mmol/L (ref 98–111)
Creatinine, Ser: 0.82 mg/dL (ref 0.44–1.00)
GFR calc Af Amer: >60 mL/min (ref >60)
GFR calc non Af Amer: >60 mL/min (ref >60)
Glucose, Bld: 103 mg/dL — ABNORMAL HIGH (ref 70–99)
Potassium: 3.6 mmol/L (ref 3.5–5.1)
Sodium: 138 mmol/L (ref 135–145)
Total Bilirubin: 1 mg/dL (ref 0.3–1.2)
Total Protein: 7.2 g/dL (ref 6.5–8.1)

## 2019-10-19 LAB — RESPIRATORY PANEL BY RT PCR (FLU A&B, COVID)
Influenza A by PCR: NEGATIVE
Influenza B by PCR: NEGATIVE
SARS Coronavirus 2 by RT PCR: NEGATIVE

## 2019-10-19 LAB — GLUCOSE, CAPILLARY
Glucose-Capillary: 77 mg/dL (ref 70–99)
Glucose-Capillary: 94 mg/dL (ref 70–99)

## 2019-10-19 LAB — URINALYSIS, ROUTINE W REFLEX MICROSCOPIC
Bacteria, UA: NONE SEEN
Bilirubin Urine: NEGATIVE
Glucose, UA: NEGATIVE mg/dL
Hgb urine dipstick: NEGATIVE
Ketones, ur: NEGATIVE mg/dL
Nitrite: NEGATIVE
Protein, ur: NEGATIVE mg/dL
Specific Gravity, Urine: 1.012 (ref 1.005–1.030)
WBC, UA: >50 WBC/hpf — ABNORMAL HIGH (ref 0–5)
pH: 8 (ref 5.0–8.0)

## 2019-10-19 LAB — POCT PREGNANCY, URINE: Preg Test, Ur: NEGATIVE

## 2019-10-19 LAB — ETHANOL: Alcohol, Ethyl (B): <10 mg/dL (ref <10)

## 2019-10-19 LAB — APTT: aPTT: 30 seconds (ref 24–36)

## 2019-10-19 MED ORDER — METOCLOPRAMIDE HCL 5 MG/ML IJ SOLN
5.0000 mg | Freq: Once | INTRAMUSCULAR | Status: AC
  Administered 2019-10-19: 18:00:00 5 mg via INTRAVENOUS
  Filled 2019-10-19: qty 2

## 2019-10-19 MED ORDER — IOHEXOL 350 MG/ML SOLN
100.0000 mL | Freq: Once | INTRAVENOUS | Status: AC | PRN
  Administered 2019-10-19: 18:00:00 100 mL via INTRAVENOUS

## 2019-10-19 MED ORDER — SENNOSIDES-DOCUSATE SODIUM 8.6-50 MG PO TABS: 1.0000 | ORAL_TABLET | Freq: Every evening | ORAL | Status: DC | PRN

## 2019-10-19 MED ORDER — STROKE: EARLY STAGES OF RECOVERY BOOK: Freq: Once | Status: DC

## 2019-10-19 MED ORDER — ONDANSETRON HCL 4 MG/2ML IJ SOLN: 4.0000 mg | Freq: Four times a day (QID) | INTRAMUSCULAR | Status: DC | PRN

## 2019-10-19 MED ORDER — ACETAMINOPHEN 325 MG PO TABS
650.0000 mg | ORAL_TABLET | Freq: Four times a day (QID) | ORAL | Status: DC | PRN
  Administered 2019-10-19 – 2019-10-21 (×3): 650 mg via ORAL
  Filled 2019-10-19 (×3): qty 2

## 2019-10-19 MED ORDER — ONDANSETRON HCL 4 MG/2ML IJ SOLN
INTRAMUSCULAR | Status: AC
  Filled 2019-10-19: qty 2

## 2019-10-19 MED ORDER — ALTEPLASE (STROKE) FULL DOSE INFUSION
0.9000 mg/kg | Freq: Once | INTRAVENOUS | Status: AC
  Administered 2019-10-19: 17:00:00 45.3 mg via INTRAVENOUS
  Filled 2019-10-19: qty 100

## 2019-10-19 MED ORDER — SODIUM CHLORIDE 0.9 % IV SOLN
50.0000 mL | Freq: Once | INTRAVENOUS | Status: AC
  Administered 2019-10-19: 18:00:00 50 mL via INTRAVENOUS

## 2019-10-19 MED ORDER — ONDANSETRON HCL 4 MG/2ML IJ SOLN
4.0000 mg | Freq: Once | INTRAMUSCULAR | Status: AC
  Administered 2019-10-19: 17:00:00 4 mg via INTRAVENOUS

## 2019-10-19 MED ORDER — SODIUM CHLORIDE 0.9 % IV SOLN
INTRAVENOUS | Status: DC
  Administered 2019-10-19 – 2019-10-20 (×2): 800 mL via INTRAVENOUS

## 2019-10-19 NOTE — ED Notes (Signed)
Pt transported for CT Angio.

## 2019-10-19 NOTE — ED Notes (Signed)
MD on phone w/ pt's friend. Friend states pt was at her house at 1330 with c/o of symptoms. Friend states pt left at 1430.

## 2019-10-19 NOTE — H&P (Signed)
History and Physical:    Candice Villarreal   ZOX:[id number] DOB: 2000/10/06 DOA: 10/19/2019  Referring MD/provider: Paschal DoppSarah Monks, MD PCP: Patient, No Pcp Per   Patient coming from: Home  Chief Complaint: Right facial numbness  History of Present Illness:   Candice Villarreal is an 19 y.o. female with medical history significant for migraine, acute appendicitis status post laparoscopic appendectomy, presented to the hospital because of right facial numbness and right arm numbness.  She said she developed numbness on the right side of her face around 1:30 PM.  This lasted about 10 minutes.  However numbness on the right side of the face recurred but went away again after about 4 minutes.  She has not noticed numbness in her right upper extremity.  She said she had recurrent episodes of right facial numbness for about 4 times.  She noticed that her speech was a little slurred.  Her symptoms were associated with a headache.  She vomited a few times at home.  She came to the emergency room because of recurrence of her symptoms.  According to ED physician, Dr. Colon BranchMonks, patient had significant expressive aphasia when she first presented to the ED. She does not report any weakness in her extremities although chart review shows that patient had initially reported some weakness in the right arm.  No other complaints.  No dizziness, syncope or loss of consciousness, chest pain, shortness of breath, cough, fever, diarrhea, abdominal pain or any urinary symptoms   ED Course:  The patient had normal vital signs in the ED.  Blood pressure was normal.  Code stroke was called.  CT head, CT cerebral perfusion, CT angio of head and neck were unremarkable.  She was evaluated by tele neurologist.  Patient was given TPA in the emergency room which was started at 4:48 PM.   ROS:   ROS all other systems reviewed were negative  Past Medical History:   Past Medical History:  Diagnosis Date  . Acute appendicitis  01/28/2019    Past Surgical History:   Past Surgical History:  Procedure Laterality Date  . LAPAROSCOPIC APPENDECTOMY N/A 01/28/2019   Procedure: APPENDECTOMY LAPAROSCOPIC;  Surgeon: Ancil Linseyavis, Jason Evan, MD;  Location: [facility] ORS;  Service: General;  Laterality: N/A;    Social History:   Social History   Socioeconomic History  . Marital status: Single    Spouse name: Not on file  . Number of children: Not on file  . Years of education: Not on file  . Highest education level: Not on file  Occupational History  . Not on file  Tobacco Use  . Smoking status: Current Every Day Smoker  . Smokeless tobacco: Never Used  Substance and Sexual Activity  . Alcohol use: Not Currently  . Drug use: Yes    Types: Marijuana  . Sexual activity: Not on file  Other Topics Concern  . Not on file  Social History Narrative  . Not on file   Social Determinants of Health   Financial Resource Strain: Low Risk   . Difficulty of Paying Living Expenses: Not hard at all  Food Insecurity: No Food Insecurity  . Worried About Programme researcher, broadcasting/film/videounning Out of Food in the Last Year: Never true  . Ran Out of Food in the Last Year: Never true  Transportation Needs: No Transportation Needs  . Lack of Transportation (Medical): No  . Lack of Transportation (Non-Medical): No  Physical Activity: Sufficiently Active  . Days of Exercise per Week: 5 days  . Minutes  of Exercise per Session: 60 min  Stress: Stress Concern Present  . Feeling of Stress : To some extent  Social Connections: Unknown  . Frequency of Communication with Friends and Family: More than three times a week  . Frequency of Social Gatherings with Friends and Family: More than three times a week  . Attends Religious Services: Patient refused  . Active Member of Clubs or Organizations: Patient refused  . Attends Banker Meetings: Patient refused  . Marital Status: Living with partner  Intimate Partner Violence: Not At Risk  . Fear of Current or  Ex-Partner: No  . Emotionally Abused: No  . Physically Abused: No  . Sexually Abused: No    Allergies   Patient has no known allergies.  Family history:   History reviewed. No pertinent family history.   No history of stroke or MI in her family.  Current Medications:   Prior to Admission medications   Not on File    Physical Exam:   Vitals:   10/19/19 1715 10/19/19 1730 10/19/19 1737 10/19/19 1749  BP: 113/64 116/67 116/67 116/63  Pulse: 85 86 94 70  Resp: (!) 29 (!) 28 (!) 29 18  Temp:      TempSrc:      SpO2: 100% 100% 100% 100%  Weight:      Height:         Physical Exam: Blood pressure 116/63, pulse 70, temperature 97.9 F (36.6 C), resp. rate 18, height 5\' 1"  (1.549 m), weight 50.3 kg, SpO2 100 %. Gen: No acute distress. Head: Normocephalic, atraumatic. Eyes: Pupils equal, round and reactive to light. Extraocular movements intact.  Sclerae nonicteric.  Mouth: Moist mucous membranes Neck: Supple, no thyromegaly, no lymphadenopathy, no jugular venous distention. Chest: Lungs are clear to auscultation with good air movement. No rales, rhonchi or wheezes.  CV: Heart sounds are regular with an S1, S2. No murmurs, rubs or gallops.  Abdomen: Soft, nontender, nondistended with normal active bowel sounds. No palpable masses. Extremities: Extremities are without clubbing, or cyanosis. No edema. Pedal pulses 2+.  Skin: Warm and dry. No rashes, lesions or wounds Neuro: Alert and oriented times 3; ? Mild right facial droop, tongue is slightly deviated to the right, no pronator drift, no weakness in the upper and lower extremities.  Speech is slightly dysarthric.  (NIHSS score was 2 on the time of my exam AT 5:44 pm-slurred speech and right facial droop) Psych: Insight is good and judgment is appropriate. Mood and affect normal.   Data Review:    Labs: Basic Metabolic Panel: Recent Labs  Lab 10/19/19 1601  NA 138  K 3.6  CL 104  CO2 26  GLUCOSE 103*  BUN 13   CREATININE 0.82  CALCIUM 9.0   Liver Function Tests: Recent Labs  Lab 10/19/19 1601  AST 17  ALT 12  ALKPHOS 48  BILITOT 1.0  PROT 7.2  ALBUMIN 4.3   No results for input(s): LIPASE, AMYLASE in the last 168 hours. No results for input(s): AMMONIA in the last 168 hours. CBC: Recent Labs  Lab 10/19/19 1601  WBC 14.1*  NEUTROABS 10.7*  HGB 15.6*  HCT 45.4  MCV 84.9  PLT 294   Cardiac Enzymes: No results for input(s): CKTOTAL, CKMB, CKMBINDEX, TROPONINI in the last 168 hours.  BNP (last 3 results) No results for input(s): PROBNP in the last 8760 hours. CBG: Recent Labs  Lab 10/19/19 1631  GLUCAP 77    Urinalysis    Component  Value Date/Time   COLORURINE STRAW (A) 04/13/2019 1052   APPEARANCEUR CLEAR (A) 04/13/2019 1052   LABSPEC 1.008 04/13/2019 1052   PHURINE 6.0 04/13/2019 1052   GLUCOSEU NEGATIVE 04/13/2019 1052   HGBUR NEGATIVE 04/13/2019 1052   BILIRUBINUR NEGATIVE 04/13/2019 1052   KETONESUR NEGATIVE 04/13/2019 1052   PROTEINUR NEGATIVE 04/13/2019 1052   NITRITE NEGATIVE 04/13/2019 1052   LEUKOCYTESUR NEGATIVE 04/13/2019 1052      Radiographic Studies: CT ANGIO HEAD W OR WO CONTRAST  Result Date: 10/19/2019 CLINICAL DATA:  19 year old female code stroke presentation with right facial droop and arm drift. Symptom onset 1330 hours. EXAM: CT ANGIOGRAPHY HEAD AND NECK CT PERFUSION BRAIN TECHNIQUE: Multidetector CT imaging of the head and neck was performed using the standard protocol during bolus administration of intravenous contrast. Multiplanar CT image reconstructions and MIPs were obtained to evaluate the vascular anatomy. Carotid stenosis measurements (when applicable) are obtained utilizing NASCET criteria, using the distal internal carotid diameter as the denominator. Multiphase CT imaging of the brain was performed following IV bolus contrast injection. Subsequent parametric perfusion maps were calculated using RAPID software. CONTRAST:   OMNIPAQUE IOHEXOL 350 MG/ML SOLN COMPARISON:  Plain head CT today 1345 hours. FINDINGS: CT Brain Perfusion Findings: ASPECTS: 10 CBF (<30%) Volume: None Perfusion (Tmax>6.0s) volume: None. Although there is a small volume of left posterior MCA region T-max > 4s volume detected. Mismatch Volume: Not applicable Infarction Location:Not applicable CTA NECK Skeleton: Negative. Upper chest: Negative; small volume residual thymus in the anterior superior mediastinum. Other neck: Within normal limits. Other findings: A repeat noncontrast head CT is also provided. Gray-white matter differentiation appears stable and within normal limits. No acute intracranial abnormality. Aortic arch: Aberrant origin of the right subclavian artery, the thus 4 vessel arch configuration. No arch atherosclerosis. Right carotid system: Negative. Left carotid system: Negative. 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 Normal right vertebral artery origin and negative cervical right vertebral artery. Normal proximal left subclavian artery. Dominant left vertebral artery is normal from the origin to the skull base. CTA HEAD Posterior circulation: Dominant left V4 segment. No distal vertebral plaque or stenosis. Patent PICA origins and vertebrobasilar junction. Patent basilar artery. Normal SCA and right PCA origins. The left P1 segment is highly tortuous but appears otherwise within normal limits. Bilateral PCA branches are within normal limits. Anterior circulation: Both ICA siphons are patent. And no siphon plaque or stenosis is identified. There is a mildly tortuous left posterior communicating artery on the left. Patent carotid termini. Normal MCA and ACA origins. Anterior communicating artery and bilateral ACA branches are within normal limits. Right MCA M1 segment and bifurcation are patent without stenosis. Right MCA branches are within normal limits. Left MCA M1 segment and bifurcation are  patent without stenosis. Left MCA branches are within normal limits. Venous sinuses: Patent. Dominant right transverse and sigmoid sinus. Anatomic variants: Aberrant origin right subclavian artery. Dominant left vertebral artery. Fetal type left PCA origin. Review of the MIP images confirms the above findings IMPRESSION: 1. Normal CTA head and neck, negative for ELVO. 2. CT perfusion is negative according to standard parameters, although there is a small volume of T-max > 4s parenchyma detected in the left MCA territory. 3. Repeat plain CT head appears stable and negative. This was discussed by telephone with Dr. Colon Branch on 10/19/2019 at 17:50, who advised that the patient did receive IV tPA shortly before this exam. And we discussed the possibility that the patient has  a distal branch ischemia accounting for #2. Noncontrast MRI would evaluate further. Electronically Signed   By: Odessa Fleming M.D.   On: 10/19/2019 17:55   CT ANGIO NECK W OR WO CONTRAST  Result Date: 10/19/2019 CLINICAL DATA:  19 year old female code stroke presentation with right facial droop and arm drift. Symptom onset 1330 hours. EXAM: CT ANGIOGRAPHY HEAD AND NECK CT PERFUSION BRAIN TECHNIQUE: Multidetector CT imaging of the head and neck was performed using the standard protocol during bolus administration of intravenous contrast. Multiplanar CT image reconstructions and MIPs were obtained to evaluate the vascular anatomy. Carotid stenosis measurements (when applicable) are obtained utilizing NASCET criteria, using the distal internal carotid diameter as the denominator. Multiphase CT imaging of the brain was performed following IV bolus contrast injection. Subsequent parametric perfusion maps were calculated using RAPID software. CONTRAST:  OMNIPAQUE IOHEXOL 350 MG/ML SOLN COMPARISON:  Plain head CT today 1345 hours. FINDINGS: CT Brain Perfusion Findings: ASPECTS: 10 CBF (<30%) Volume: None Perfusion (Tmax>6.0s) volume: None. Although there  is a small volume of left posterior MCA region T-max > 4s volume detected. Mismatch Volume: Not applicable Infarction Location:Not applicable CTA NECK Skeleton: Negative. Upper chest: Negative; small volume residual thymus in the anterior superior mediastinum. Other neck: Within normal limits. Other findings: A repeat noncontrast head CT is also provided. Gray-white matter differentiation appears stable and within normal limits. No acute intracranial abnormality. Aortic arch: Aberrant origin of the right subclavian artery, the thus 4 vessel arch configuration. No arch atherosclerosis. Right carotid system: Negative. Left carotid system: Negative. 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 Normal right vertebral artery origin and negative cervical right vertebral artery. Normal proximal left subclavian artery. Dominant left vertebral artery is normal from the origin to the skull base. CTA HEAD Posterior circulation: Dominant left V4 segment. No distal vertebral plaque or stenosis. Patent PICA origins and vertebrobasilar junction. Patent basilar artery. Normal SCA and right PCA origins. The left P1 segment is highly tortuous but appears otherwise within normal limits. Bilateral PCA branches are within normal limits. Anterior circulation: Both ICA siphons are patent. And no siphon plaque or stenosis is identified. There is a mildly tortuous left posterior communicating artery on the left. Patent carotid termini. Normal MCA and ACA origins. Anterior communicating artery and bilateral ACA branches are within normal limits. Right MCA M1 segment and bifurcation are patent without stenosis. Right MCA branches are within normal limits. Left MCA M1 segment and bifurcation are patent without stenosis. Left MCA branches are within normal limits. Venous sinuses: Patent. Dominant right transverse and sigmoid sinus. Anatomic variants: Aberrant origin right subclavian artery. Dominant  left vertebral artery. Fetal type left PCA origin. Review of the MIP images confirms the above findings IMPRESSION: 1. Normal CTA head and neck, negative for ELVO. 2. CT perfusion is negative according to standard parameters, although there is a small volume of T-max > 4s parenchyma detected in the left MCA territory. 3. Repeat plain CT head appears stable and negative. This was discussed by telephone with Dr. Colon Branch on 10/19/2019 at 17:50, who advised that the patient did receive IV tPA shortly before this exam. And we discussed the possibility that the patient has a distal branch ischemia accounting for #2. Noncontrast MRI would evaluate further. Electronically Signed   By: Odessa Fleming M.D.   On: 10/19/2019 17:55   CT CEREBRAL PERFUSION W CONTRAST  Result Date: 10/19/2019 CLINICAL DATA:  19 year old female code stroke presentation with right facial droop  and arm drift. Symptom onset 1330 hours. EXAM: CT ANGIOGRAPHY HEAD AND NECK CT PERFUSION BRAIN TECHNIQUE: Multidetector CT imaging of the head and neck was performed using the standard protocol during bolus administration of intravenous contrast. Multiplanar CT image reconstructions and MIPs were obtained to evaluate the vascular anatomy. Carotid stenosis measurements (when applicable) are obtained utilizing NASCET criteria, using the distal internal carotid diameter as the denominator. Multiphase CT imaging of the brain was performed following IV bolus contrast injection. Subsequent parametric perfusion maps were calculated using RAPID software. CONTRAST:  OMNIPAQUE IOHEXOL 350 MG/ML SOLN COMPARISON:  Plain head CT today 1345 hours. FINDINGS: CT Brain Perfusion Findings: ASPECTS: 10 CBF (<30%) Volume: None Perfusion (Tmax>6.0s) volume: None. Although there is a small volume of left posterior MCA region T-max > 4s volume detected. Mismatch Volume: Not applicable Infarction Location:Not applicable CTA NECK Skeleton: Negative. Upper chest: Negative; small volume  residual thymus in the anterior superior mediastinum. Other neck: Within normal limits. Other findings: A repeat noncontrast head CT is also provided. Gray-white matter differentiation appears stable and within normal limits. No acute intracranial abnormality. Aortic arch: Aberrant origin of the right subclavian artery, the thus 4 vessel arch configuration. No arch atherosclerosis. Right carotid system: Negative. Left carotid system: Negative. 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 Normal right vertebral artery origin and negative cervical right vertebral artery. Normal proximal left subclavian artery. Dominant left vertebral artery is normal from the origin to the skull base. CTA HEAD Posterior circulation: Dominant left V4 segment. No distal vertebral plaque or stenosis. Patent PICA origins and vertebrobasilar junction. Patent basilar artery. Normal SCA and right PCA origins. The left P1 segment is highly tortuous but appears otherwise within normal limits. Bilateral PCA branches are within normal limits. Anterior circulation: Both ICA siphons are patent. And no siphon plaque or stenosis is identified. There is a mildly tortuous left posterior communicating artery on the left. Patent carotid termini. Normal MCA and ACA origins. Anterior communicating artery and bilateral ACA branches are within normal limits. Right MCA M1 segment and bifurcation are patent without stenosis. Right MCA branches are within normal limits. Left MCA M1 segment and bifurcation are patent without stenosis. Left MCA branches are within normal limits. Venous sinuses: Patent. Dominant right transverse and sigmoid sinus. Anatomic variants: Aberrant origin right subclavian artery. Dominant left vertebral artery. Fetal type left PCA origin. Review of the MIP images confirms the above findings IMPRESSION: 1. Normal CTA head and neck, negative for ELVO. 2. CT perfusion is negative according to  standard parameters, although there is a small volume of T-max > 4s parenchyma detected in the left MCA territory. 3. Repeat plain CT head appears stable and negative. This was discussed by telephone with Dr. Colon Branch on 10/19/2019 at 17:50, who advised that the patient did receive IV tPA shortly before this exam. And we discussed the possibility that the patient has a distal branch ischemia accounting for #2. Noncontrast MRI would evaluate further. Electronically Signed   By: Odessa Fleming M.D.   On: 10/19/2019 17:55   CT HEAD CODE STROKE WO CONTRAST`  Result Date: 10/19/2019 CLINICAL DATA:  Code stroke. 19 year old female with right facial droop, arm drift. Symptom onset 1330 hours. Images on this study became available at 3:57 pm on 10/19/2019. EXAM: CT HEAD WITHOUT CONTRAST TECHNIQUE: Contiguous axial images were obtained from the base of the skull through the vertex without intravenous contrast. COMPARISON:  Head CT 05/15/2018. FINDINGS: Brain: Cerebral volume remains normal.  No midline shift, ventriculomegaly, mass effect, evidence of mass lesion, intracranial hemorrhage or evidence of cortically based acute infarction. Gray-white matter differentiation is within normal limits throughout the brain. Vascular: No suspicious intracranial vascular hyperdensity. Skull: Stable, negative. Sinuses/Orbits: Visualized paranasal sinuses and mastoids are clear. Other: Visualized orbits and scalp soft tissues are within normal limits. ASPECTS [facility] Stroke Program Early CT Score) Total score (0-10 with 10 being normal): 10 IMPRESSION: 1. Stable and Normal noncontrast CT appearance of the brain. ASPECTS 10. 2. Study discussed by telephone with Dr. Joan Mayans on 10/19/2019 at 16:02 . Electronically Signed   By: Genevie Ann M.D.   On: 10/19/2019 16:03    EKG: Independently reviewed.  EKG showed normal sinus rhythm   Assessment/Plan:   Principal Problem:   Numbness and tingling of right face Active Problems:   Right arm  weakness   Expressive aphasia   Tobacco use disorder   Right facial and arm numbness, right arm weakness and expressive aphasia: Admit to stepdown unit.  Differential diagnosis include acute nonhemorrhagic stroke, TIA, hemiplegic migraine.  Patient received TPA in the emergency room.  Monitor closely in the stepdown unit per protocol.  No antiplatelets or anticoagulants for 24 hours.  MRI has been ordered for further evaluation.  Obtain 2D echo, lipid panel and hemoglobin A1c.  PT, OT and ST evaluation  Nausea and vomiting: Antiemetics as needed  Leukocytosis: This is likely reactive  Tobacco use disorder: Patient has been counseled to quit smoking cigarettes.      Body mass index is 20.97 kg/m.  Other information:   DVT prophylaxis: SCDs Code Status: Full code. Family Communication: Plan discussed with her husband at the bedside Disposition Plan: Possible discharge to home in 2 to 3 days Consults called: Neurologist Admission status: Observation  The medical decision making on this patient was of high complexity and the patient is at high risk for clinical deterioration, therefore this is a level 3 visit.    Time spent 70 minutes  [place] [facility]   How to contact the [facility] Attending or Consulting provider [place] or covering provider during after hours [place], for this patient?   1. Check the care team in [facility] and look for a) attending/consulting [facility] provider listed and b) the [facility] team listed 2. Log into [email or web address] and use [place]'s universal password to access. If you do not have the password, please contact the hospital operator. 3. Locate the [facility] provider you are looking for under [facility] and page to a number that you can be directly reached. 4. If you still have difficulty reaching the provider, please page the [facility] (Director on Call) for the Hospitalists listed on amion for assistance.  10/19/2019, 6:08 PM

## 2019-10-19 NOTE — ED Notes (Addendum)
[facility] (unknown time - approximately 13:20) pt was c/o tingling, HA, slurred speech and numbness intermittent.  At 15:20 symptoms occurring again and pt brought to ED.

## 2019-10-19 NOTE — ED Provider Notes (Addendum)
[facility] Emergency Department Provider Note  ____________________________________________   First MD Initiated Contact with Patient 10/19/19 1545     (approximate)  I have reviewed the triage vital signs and the nursing notes.  History  Chief Complaint Code Stroke    HPI Candice Villarreal is a 18 y.o. female with no significant medical history who presents to the emergency department as a CODE STROKE.  Patient last known well between 1:20 and 1:30 PM when she was at her friends house ([place] confirmed by friend via phone).  After which time she started to complain of right hand numbness, right facial numbness.  She was noted to have slurred speech as well.  Also complained of an associated headache - she is unable to describe further due to her aphasia.  Symptoms seem to wax and wane with some improvement intermittently and then again recurrence.  1 of these episodes was witnessed by her sister-in-law at 3:20 PM, who brought her to the emergency department for further evaluation.  Sister-in-law states she has been having near daily headaches and has a history of rare migraines, but has never had associated neurological symptoms with them.   No history of seizures or coagulopathies.  Caveat: History limited by patient's aphasia, supplemented by sister-in-law, friend with whom she was with when last known normal, and husband at bedside.  Past Medical Hx Past Medical History:  Diagnosis Date  . Acute appendicitis 01/28/2019    Problem List There are no problems to display for this patient.   Past Surgical Hx Past Surgical History:  Procedure Laterality Date  . LAPAROSCOPIC APPENDECTOMY N/A 01/28/2019   Procedure: APPENDECTOMY LAPAROSCOPIC;  Surgeon: Vickie Epley, MD;  Location: [facility] ORS;  Service: General;  Laterality: N/A;    Medications Prior to Admission medications   Medication Sig Start Date End Date Taking? Authorizing Provider  ondansetron  (ZOFRAN ODT) 4 MG disintegrating tablet Take 1 tablet (4 mg total) by mouth every 8 (eight) hours as needed for nausea or vomiting. 04/13/19   Blake Divine, MD    Allergies Patient has no known allergies.  Family Hx No family history on file.  Social Hx Social History   Tobacco Use  . Smoking status: Never Smoker  . Smokeless tobacco: Never Used  Substance Use Topics  . Alcohol use: Not Currently  . Drug use: Never     Review of Systems  Constitutional: Negative for fever, chills. Eyes: Negative for visual changes. ENT: Negative for sore throat. Cardiovascular: Negative for chest pain. Respiratory: Negative for shortness of breath. Gastrointestinal: Negative for nausea, vomiting.  Genitourinary: Negative for dysuria. Musculoskeletal: Negative for leg swelling. Skin: Negative for rash. Neurological: Positive for headache, decreased sensation, facial droop, expressive aphasia.   Physical Exam  Vital Signs: ED Triage Vitals  Enc Vitals Group     BP 10/19/19 1601 123/69     Pulse Rate 10/19/19 1557 89     Resp 10/19/19 1557 17     Temp 10/19/19 1557 97.8 F (36.6 C)     Temp Source 10/19/19 1557 Oral     SpO2 10/19/19 1557 100 %     Weight 10/19/19 1559 111 lb (50.3 kg)     Height 10/19/19 1559 5\' 1"  (1.549 m)     Head Circumference --      Peak Flow --      Pain Score 10/19/19 1559 7     Pain Loc --      Pain Edu? --  Excl. in GC? --      Constitutional: Awake. Oriented to self, age, DOB though has notable expressive aphasia.  Head: Normocephalic. Atraumatic. Subtle R sided facial droop.  Eyes: Conjunctivae clear. Sclera anicteric. Visual fields grossly in tact.  Nose: No congestion. No rhinorrhea. Mouth/Throat: Wearing mask.  Neck: No stridor.   Cardiovascular: Normal rate, regular rhythm. Extremities well perfused. Respiratory: Normal respiratory effort.  Lungs CTAB. Gastrointestinal: Soft. Non-tender. Non-distended.  Musculoskeletal: No lower  extremity edema. No deformities. Neurologic:  On exam, she is somewhat sleepy, but arouses to minor stimulation.  She has a subtle R facial droop. Reports decreased sensation to the right side face.  Severe expressive aphasia. NIHSS 5.  Skin: Skin is warm, dry and intact. No rash noted. Psychiatric: Mood and affect are appropriate for situation.  EKG  Personally reviewed.   Rate: 83 Rhythm: sinus Axis: normal Intervals: WNL No acute ischemic changes No STEMI    Radiology  CT head: IMPRESSION:  1. Stable and Normal noncontrast CT appearance of the brain. ASPECTS 10.  2. Study discussed by telephone with Dr. Colon Branch on 10/19/2019 at 16:02    Procedures  Procedure(s) performed (including critical care):  .Critical Care Performed by: Miguel Aschoff., MD Authorized by: Miguel Aschoff., MD   Critical care provider statement:    Critical care time (minutes):  45   Critical care was necessary to treat or prevent imminent or life-threatening deterioration of the following conditions:  CNS failure or compromise   Critical care was time spent personally by me on the following activities:  Discussions with consultants, evaluation of patient's response to treatment, examination of patient, ordering and performing treatments and interventions, ordering and review of laboratory studies, ordering and review of radiographic studies, pulse oximetry, re-evaluation of patient's condition, obtaining history from patient or surrogate and review of old charts     Initial Impression / Assessment and Plan / ED Course  19 y.o. female who presents to the ED as a CODE stroke for right-sided facial droop, right-sided numbness, expressive aphasia.  Ddx: TIA/CVA, complex migraine, stress reaction  CT code stroke activated on arrival.  Initial CT head negative. NIHSS 5 on initial exam, presentation certainly concerning for CVA vs complex migraine.  Evaluated by teleneurology. Although she is young with  minimal CVA risk factors, her expressive aphasia is quite severe and would likely be debilitating in the case of CVA.  Neurology and myself has discussed risks and benefits with husband at bedside regarding tPA, who are agreeable with proceeding.  She has no recent surgery, no history of intracranial bleeding, no recent GI bleeding.  CT perfusion and angio imaging also ordered. Will proceed with tPA as she is still within the window and plan for admission.    Final Clinical Impression(s) / ED Diagnosis  Final diagnoses:  Aphasia  Facial droop  Numbness       Note:  This document was prepared using Dragon voice recognition software and may include unintentional dictation errors.     Miguel Aschoff., MD 10/19/19 [phone]

## 2019-10-19 NOTE — ED Notes (Signed)
Called code stroke to [phone]

## 2019-10-19 NOTE — ED Notes (Addendum)
This RN at bedside waiting for Tele neurologist. Stroke nurse present via monitor. Pt stating the HA is coming back and getting worse. MD at bedside.

## 2019-10-19 NOTE — ED Notes (Addendum)
VS starting at 1700 Qx70mins  Neuro checks Q2hrs

## 2019-10-19 NOTE — ED Notes (Signed)
Husband at bedside. Husband stating pt texted him at 55 stating she did not feel good.

## 2019-10-19 NOTE — ED Notes (Addendum)
Neurologist NIH of 4

## 2019-10-19 NOTE — ED Notes (Signed)
Neurologist decision to administered TPA.

## 2019-10-19 NOTE — ED Triage Notes (Signed)
Pt now unable to get words out. Pt exhibits expressive aphasia.

## 2019-10-19 NOTE — Progress Notes (Signed)
   10/19/19 1546  Clinical Encounter Type  Visited With Patient  Visit Type Initial  Referral From Nurse  Consult/Referral To Chaplain  Spiritual Encounters  Spiritual Needs Other (Comment)  CH received page for a code stroke at 1546. Pt struggled making needs known upon arrival. RN shared that she was in process of contacting family members. CH will follow up later if requested.

## 2019-10-19 NOTE — ED Triage Notes (Signed)
Pt to triage via POV with c/o right arm numbness and facial numbness.  Pt with noted facial droop and right arm drift.  PT states symptoms started at 1330.  Pt tearful, but having trouble forming full thoughts.  Charge RN notified, bed assigned, pt to CT

## 2019-10-19 NOTE — Consult Note (Addendum)
[facility] TeleNeurology Consult Services   Date of Service:   10/19/2019 16:11:28  Impression:     .  I63.9 - Cerebrovascular accident (CVA), unspecified mechanism ([place])  Comments/Sign-Out: She would benefit from mri head and inpatient neurology follow up to further evaluate her symptoms and decide on further eval and management and tx  Metrics: Last Known Well: 10/19/2019 13:00:00 [facility] Notification Time: 10/19/2019 16:11:28 Arrival Time: 10/19/2019 15:38:00 Stamp Time: 10/19/2019 16:11:28 Time First Login Attempt: 10/19/2019 16:13:49 Symptoms: speech problems NIHSS Start Assessment Time: 10/19/2019 16:20:31 Alteplase Early Mix Decision Time: 10/19/2019 16:31:00 Patient is a candidate for Alteplase/Activase. Alteplase Medical Decision: 10/19/2019 16:30:44 Alteplase/Activase CPOE Order Time: 10/19/2019 16:32:40 Needle Time: 10/19/2019 16:48 Weight Noted by Staff: 50.3 kg Reason for Alteplase/Activase Delay: Delayed hospital activation of TS Service  CT head showed no acute hemorrhage or acute core infarct. CT head was reviewed and results were: CLINICAL DATA: Code stroke. 19 year old female with right facial droop, arm drift. Symptom onset 1330 hours.   Images on this study became available at 3:57 pm on 10/19/2019.   EXAM: CT HEAD WITHOUT CONTRAST   TECHNIQUE: Contiguous axial images were obtained from the base of the skull through the vertex without intravenous contrast.   COMPARISON: Head CT 05/15/2018.   FINDINGS: Brain: Cerebral volume remains normal. No midline shift, ventriculomegaly, mass effect, evidence of mass lesion, intracranial hemorrhage or evidence of cortically based acute infarction. Gray-white matter differentiation is within normal limits throughout the brain.   Vascular: No suspicious intracranial vascular hyperdensity.   Skull: Stable, negative.   Sinuses/Orbits: Visualized paranasal sinuses and mastoids are clear.   Other: Visualized orbits and scalp  soft tissues are within normal limits.   ASPECTS [facility] Stroke Program Early CT Score)   Total score (0-10 with 10 being normal): 10   IMPRESSION: 1. Stable and Normal noncontrast CT appearance of the brain. ASPECTS 10. 2. Study discussed by telephone with Dr. Joan Mayans on 10/19/2019 at 16:02 .     Electronically Signed By: Genevie Ann M.D. On: 10/19/2019 16:03    Lower Likelihood of Large Vessel Occlusion but Following Stat Studies are Recommended  CTA Head and Neck. CT Perfusion. Reviewed, No Indication of Large Vessel Occlusive Thrombus, Patient is not an NIR Candidate.   ED Physician notified of diagnostic impression and management plan on 10/19/2019 18:05:19  Alteplase/Activase Contraindications:  Last Known Well > 4.5 hours: No CT Head showing hemorrhage: No Ischemic stroke within 3 months: No Severe head trauma within 3 months: No Intracranial/intraspinal surgery within 3 months: No History of intracranial hemorrhage: No Symptoms and signs consistent with an SAH: No GI malignancy or GI bleed within 21 days: No Coagulopathy: Platelets <100 000/mm3, INR >1.7, aPTT>40 s, or PT >15 s: No Treatment dose of LMWH within the previous 24 hrs: No Use of NOACs in past 48 hours: No Glycoprotein IIb/IIIa receptor inhibitors use: No Symptoms consistent with infective endocarditis: No Suspected aortic arch dissection: No Intra-axial intracranial neoplasm: No  Verbal Consent to Alteplase/Activase: I have explained to the Patient and Family the nature of the patient's condition, reviewed the indications and contraindications to the use of Alteplase/Activase fibrinolytic agent, reviewed the indications and contraindications and the benefits to be reasonably expected compared with alternative approaches. I have discussed the likelihood of major risks or complications of this procedure including (if applicable) but not limited to loss of limb function, brain damage, paralysis, hemorrhage, infection,  complications from transfusion of blood components, drug reactions, blood clots and loss of life. I  have also indicated that with any procedure there is always the possibility of an unexpected complication. All questions were answered and Patient and Family express understanding of the treatment plan and consent to the treatment.  Our recommendations are outlined below.  Recommendations: IV Alteplase/Activase recommended.  Alteplase/Activase bolus given Without Complication.   IV Alteplase/Activase Total Dose - 45.3 mg IV Alteplase/Activase Bolus Dose - 4.5 mg IV Alteplase/Activase Infusion Dose - 40.7 mg  Routine post Alteplase/Activase monitoring including neuro checks and blood pressure control during/after treatment Monitor blood pressure Check blood pressure and NIHSS every 15 min for 2 h, then every 30 min for 6 h, and finally every hour for 16 h.  Manage Blood Pressure per post Alteplase/Activase protocol.      .  Admission to ICU     .  CT brain 24 hours post Alteplase/Activase     .  NPO until swallowing screen performed and passed     .  No antiplatelet agents or anticoagulants (including heparin for DVT prophylaxis) in first 24 hours     .  No Foley catheter, nasogastric tube, arterial catheter or central venous catheter for 24 hr, unless absolutely necessary     .  Telemetry     .  Bedside swallow evaluation     .  HOB less than 30 degrees     .  Euglycemia     .  Avoid hyperthermia, PRN acetaminophen     .  DVT prophylaxis     .  Inpatient Neurology Consultation     .  Stroke evaluation as per inpatient neurology recommendations  Discussed with ED physician    ------------------------------------------------------------------------------  History of Present Illness: Patient is a 19 year old Female.  Patient was brought by private transportation with symptoms of speech problems  thiss is a 19 year old female who today developed some right arm tingling and then  complaints of weakness of the right arm and then some speech problems. her husbaand is by the bedside and is offering some of the health history and was present during the consent for the alteplase. She is no other significant health problems. He is not aware that she has any history of migraines. She is not on any prescription medications.   Anticoagulant use:  No  Antiplatelet use: No   Examination: BP(123/69), Pulse(92), Blood Glucose(77) 1A: Level of Consciousness - Arouses to minor stimulation + 1 1B: Ask Month and Age - Both Questions Right + 0 1C: Blink Eyes & Squeeze Hands - Performs Both Tasks + 0 2: Test Horizontal Extraocular Movements - Normal + 0 3: Test Visual Fields - No Visual Loss + 0 4: Test Facial Palsy (Use Grimace if Obtunded) - Partial paralysis (lower face) + 2 5A: Test Left Arm Motor Drift - No Drift for 10 Seconds + 0 5B: Test Right Arm Motor Drift - No Drift for 10 Seconds + 0 6A: Test Left Leg Motor Drift - No Drift for 5 Seconds + 0 6B: Test Right Leg Motor Drift - No Drift for 5 Seconds + 0 7: Test Limb Ataxia (FNF/Heel-Shin) - No Ataxia + 0 8: Test Sensation - Normal; No sensory loss + 0 9: Test Language/Aphasia - Mild-Moderate Aphasia: Some Obvious Changes, Without Significant Limitation + 1 10: Test Dysarthria - Normal + 0 11: Test Extinction/Inattention - No abnormality + 0  NIHSS Score: 4  Pre-Morbid Modified Ranking Scale: 0 Points = No symptoms at all   Patient/Family was informed the Neurology Consult  would occur via TeleHealth consult by way of interactive audio and video telecommunications and consented to receiving care in this manner.   Due to the immediate potential for life-threatening deterioration due to underlying acute neurologic illness, I spent 20 minutes providing critical care. This time includes time for face to face visit via telemedicine, review of medical records, imaging studies and discussion of findings with providers, the  patient and/or family.   Dr Georgina Snell   [facility] [phone]  Case [id number]

## 2019-10-20 ENCOUNTER — Inpatient Hospital Stay: Payer: Medicaid Other

## 2019-10-20 ENCOUNTER — Observation Stay (HOSPITAL_COMMUNITY)
Admit: 2019-10-20 | Discharge: 2019-10-20 | Disposition: A | Discharge status: Home / Self Care | Payer: Medicaid Other | Attending: Internal Medicine | Admitting: Internal Medicine

## 2019-10-20 DIAGNOSIS — Q211 Atrial septal defect: Secondary | ICD-10-CM

## 2019-10-20 DIAGNOSIS — R4701 Aphasia: Secondary | ICD-10-CM

## 2019-10-20 DIAGNOSIS — Q2112 Patent foramen ovale: Secondary | ICD-10-CM

## 2019-10-20 DIAGNOSIS — Z9282 Status post administration of tPA (rtPA) in a different facility within the last 24 hours prior to admission to current facility: Secondary | ICD-10-CM | POA: Diagnosis not present

## 2019-10-20 DIAGNOSIS — R29810 Facial weakness: Secondary | ICD-10-CM | POA: Diagnosis present

## 2019-10-20 DIAGNOSIS — I6389 Other cerebral infarction: Secondary | ICD-10-CM

## 2019-10-20 DIAGNOSIS — I639 Cerebral infarction, unspecified: Secondary | ICD-10-CM | POA: Diagnosis present

## 2019-10-20 DIAGNOSIS — G43109 Migraine with aura, not intractable, without status migrainosus: Secondary | ICD-10-CM | POA: Diagnosis present

## 2019-10-20 DIAGNOSIS — Z823 Family history of stroke: Secondary | ICD-10-CM | POA: Diagnosis not present

## 2019-10-20 DIAGNOSIS — R200 Anesthesia of skin: Secondary | ICD-10-CM

## 2019-10-20 DIAGNOSIS — Z833 Family history of diabetes mellitus: Secondary | ICD-10-CM | POA: Diagnosis not present

## 2019-10-20 DIAGNOSIS — I253 Aneurysm of heart: Secondary | ICD-10-CM | POA: Diagnosis present

## 2019-10-20 DIAGNOSIS — R29705 NIHSS score 5: Secondary | ICD-10-CM | POA: Diagnosis present

## 2019-10-20 DIAGNOSIS — R471 Dysarthria and anarthria: Secondary | ICD-10-CM | POA: Diagnosis present

## 2019-10-20 DIAGNOSIS — F17210 Nicotine dependence, cigarettes, uncomplicated: Secondary | ICD-10-CM | POA: Diagnosis present

## 2019-10-20 DIAGNOSIS — Z20822 Contact with and (suspected) exposure to covid-19: Secondary | ICD-10-CM | POA: Diagnosis present

## 2019-10-20 DIAGNOSIS — G8321 Monoplegia of upper limb affecting right dominant side: Secondary | ICD-10-CM | POA: Diagnosis present

## 2019-10-20 DIAGNOSIS — Z79899 Other long term (current) drug therapy: Secondary | ICD-10-CM | POA: Diagnosis not present

## 2019-10-20 DIAGNOSIS — R202 Paresthesia of skin: Secondary | ICD-10-CM

## 2019-10-20 DIAGNOSIS — R531 Weakness: Secondary | ICD-10-CM | POA: Diagnosis present

## 2019-10-20 HISTORY — DX: Atrial septal defect: Q21.1

## 2019-10-20 HISTORY — DX: Patent foramen ovale: Q21.12

## 2019-10-20 HISTORY — DX: Paresthesia of skin: R20.0

## 2019-10-20 HISTORY — DX: Paresthesia of skin: R20.2

## 2019-10-20 LAB — CBC WITH DIFFERENTIAL/PLATELET
Abs Immature Granulocytes: 0.07 10*3/uL (ref 0.00–0.07)
Basophils Absolute: 0.1 10*3/uL (ref 0.0–0.1)
Basophils Relative: 1 %
Eosinophils Absolute: 0.1 10*3/uL (ref 0.0–0.5)
Eosinophils Relative: 1 %
HCT: 38.3 % (ref 36.0–46.0)
Hemoglobin: 13.1 g/dL (ref 12.0–15.0)
Immature Granulocytes: 1 %
Lymphocytes Relative: 22 %
Lymphs Abs: 2.9 10*3/uL (ref 0.7–4.0)
MCH: 29 pg (ref 26.0–34.0)
MCHC: 34.2 g/dL (ref 30.0–36.0)
MCV: 84.9 fL (ref 80.0–100.0)
Monocytes Absolute: 0.7 10*3/uL (ref 0.1–1.0)
Monocytes Relative: 6 %
Neutro Abs: 9.3 10*3/uL — ABNORMAL HIGH (ref 1.7–7.7)
Neutrophils Relative %: 69 %
Platelets: 268 10*3/uL (ref 150–400)
RBC: 4.51 MIL/uL (ref 3.87–5.11)
RDW: 12.1 % (ref 11.5–15.5)
WBC: 13.2 10*3/uL — ABNORMAL HIGH (ref 4.0–10.5)
nRBC: 0 % (ref 0.0–0.2)

## 2019-10-20 LAB — LIPID PANEL
Cholesterol: 140 mg/dL (ref 0–200)
HDL: 41 mg/dL (ref >40)
LDL Cholesterol: 86 mg/dL (ref 0–99)
Total CHOL/HDL Ratio: 3.4 RATIO
Triglycerides: 64 mg/dL (ref <150)
VLDL: 13 mg/dL (ref 0–40)

## 2019-10-20 LAB — HEMOGLOBIN A1C
Hgb A1c MFr Bld: 5.2 % (ref 4.8–5.6)
Mean Plasma Glucose: 102.54 mg/dL

## 2019-10-20 MED ORDER — CHLORHEXIDINE GLUCONATE CLOTH 2 % EX PADS
6.0000 | MEDICATED_PAD | Freq: Every day | CUTANEOUS | Status: DC
  Administered 2019-10-20: 09:00:00 6 via TOPICAL

## 2019-10-20 NOTE — Progress Notes (Addendum)
Progress Note    Jazzmen Restivo  WEX:[id number] DOB: 04-01-2001  DOA: 10/19/2019 PCP: Patient, No Pcp Per      Brief Narrative:    Medical records reviewed and are as summarized below:  Lynlee Stratton is an 19 y.o. female with medical history significant for migraine, acute appendicitis status post laparoscopic appendectomy, presented to the hospital because of right facial numbness and right arm numbness.  She said she developed numbness on the right side of her face around 1:30 PM.  This lasted about 10 minutes.  However numbness on the right side of the face recurred but went away again after about 4 minutes.  She has not noticed numbness in her right upper extremity.  She said she had recurrent episodes of right facial numbness for about 4 times.  She noticed that her speech was a little slurred.  Her symptoms were associated with a headache.  She vomited a few times at home.  She came to the emergency room because of recurrence of her symptoms.  According to ED physician, Dr. Joan Mayans, patient had significant expressive aphasia when she first presented to the ED. She does not report any weakness in her extremities although chart review shows that patient had initially reported some weakness in the right arm.  No other complaints.       Assessment/Plan:   Principal Problem:   Numbness and tingling of right face Active Problems:   Right arm weakness   Expressive aphasia   Tobacco use disorder   PFO with atrial septal aneurysm   Numbness and tingling of right side of face    Right facial and arm numbness, right arm weakness and expressive aphasia: Admit to stepdown unit.  Differential diagnosis include acute nonhemorrhagic stroke, TIA, complex migraine.  Patient received tPA in the emergency room on 10/19/2019 at 4:48 PM.  Monitor closely in the stepdown unit per protocol.  No antiplatelets or anticoagulants within 24 hours of tPA.  MRI brain did not show any acute  stroke.  Repeat CT head today per tPA protocol.  Lipid panel and hemoglobin A1c were normal. 2D echo showed EF estimated at 55 to 60%, PFO with atrial septal aneurysm.  Cardiologist has been consulted for TEE.  Plan discussed with neurologist.   Nausea and vomiting: Resolved.    Leukocytosis: This is likely reactive.  Improving.  Tobacco use disorder: Patient has been counseled to quit smoking cigarettes.    Body mass index is 19.6 kg/m.   Family Communication/Anticipated D/C date and plan/Code Status   DVT prophylaxis: Lovenox Code Status: Full code Family Communication: Plan discussed with patient Disposition Plan: Possible discharge to home tomorrow      Subjective:   No complaints.  No unilateral weakness or numbness in the face or extremities.  Speech is clear.  No nausea or vomiting.  Objective:    Vitals:   10/20/19 1200 10/20/19 1300 10/20/19 1400 10/20/19 1500  BP: (!) 106/55 102/64 104/76 (!) 106/59  Pulse: 79 66 94 78  Resp: 13 16 17 17   Temp: 98.1 F (36.7 C)     TempSrc: Oral     SpO2: 100% 100% 100% 100%  Weight:      Height:        Intake/Output Summary (Last 24 hours) at 10/20/2019 1614 Last data filed at 10/20/2019 1400 Gross per 24 hour  Intake 1570.06 ml  Output 250 ml  Net 1320.06 ml   Filed Weights   10/19/19 1559 10/19/19 1912  Weight: 50.3 kg 51.8 kg    Exam:  GEN: NAD SKIN: No rash EYES: EOMI, PERRLA ENT: MMM CV: RRR PULM: CTA B ABD: soft, ND, NT, +BS CNS: AAO x 3, non focal EXT: No edema or tenderness   Data Reviewed:   I have personally reviewed following labs and imaging studies:  Labs: Labs show the following:   Basic Metabolic Panel: Recent Labs  Lab 10/19/19 1601  NA 138  K 3.6  CL 104  CO2 26  GLUCOSE 103*  BUN 13  CREATININE 0.82  CALCIUM 9.0   GFR Estimated Creatinine Clearance: 90.2 mL/min (by C-G formula based on SCr of 0.82 mg/dL). Liver Function Tests: Recent Labs  Lab 10/19/19 1601    AST 17  ALT 12  ALKPHOS 48  BILITOT 1.0  PROT 7.2  ALBUMIN 4.3   No results for input(s): LIPASE, AMYLASE in the last 168 hours. No results for input(s): AMMONIA in the last 168 hours. Coagulation profile Recent Labs  Lab 10/19/19 1601  INR 0.9    CBC: Recent Labs  Lab 10/19/19 1601 10/20/19 0837  WBC 14.1* 13.2*  NEUTROABS 10.7* 9.3*  HGB 15.6* 13.1  HCT 45.4 38.3  MCV 84.9 84.9  PLT 294 268   Cardiac Enzymes: No results for input(s): CKTOTAL, CKMB, CKMBINDEX, TROPONINI in the last 168 hours. BNP (last 3 results) No results for input(s): PROBNP in the last 8760 hours. CBG: Recent Labs  Lab 10/19/19 1631 10/19/19 1906  GLUCAP 77 94   D-Dimer: No results for input(s): DDIMER in the last 72 hours. Hgb A1c: Recent Labs    10/20/19 0429  HGBA1C 5.2   Lipid Profile: Recent Labs    10/20/19 0429  CHOL 140  HDL 41  LDLCALC 86  TRIG 64  CHOLHDL 3.4   Thyroid function studies: No results for input(s): TSH, T4TOTAL, T3FREE, THYROIDAB in the last 72 hours.  Invalid input(s): FREET3 Anemia work up: No results for input(s): VITAMINB12, FOLATE, FERRITIN, TIBC, IRON, RETICCTPCT in the last 72 hours. Sepsis Labs: Recent Labs  Lab 10/19/19 1601 10/20/19 0837  WBC 14.1* 13.2*    Microbiology Recent Results (from the past 240 hour(s))  Respiratory Panel by RT PCR (Flu A&B, Covid) - Nasopharyngeal Swab     Status: None   Collection Time: 10/19/19  5:46 PM   Specimen: Nasopharyngeal Swab  Result Value Ref Range Status   SARS Coronavirus 2 by RT PCR NEGATIVE NEGATIVE Final    Comment: (NOTE) SARS-CoV-2 target nucleic acids are NOT DETECTED. The SARS-CoV-2 RNA is generally detectable in upper respiratoy specimens during the acute phase of infection. The lowest concentration of SARS-CoV-2 viral copies this assay can detect is 131 copies/mL. A negative result does not preclude SARS-Cov-2 infection and should not be used as the sole basis for treatment  or other patient management decisions. A negative result may occur with  improper specimen collection/handling, submission of specimen other than nasopharyngeal swab, presence of viral mutation(s) within the areas targeted by this assay, and inadequate number of viral copies (<131 copies/mL). A negative result must be combined with clinical observations, patient history, and epidemiological information. The expected result is Negative. Fact Sheet for Patients:  https://www.moore.com/https://www.fda.gov/media/142436/download Fact Sheet for Healthcare Providers:  https://www.young.biz/https://www.fda.gov/media/142435/download This test is not yet ap proved or cleared by the [place] FDA and  has been authorized for detection and/or diagnosis of SARS-CoV-2 by FDA under an Emergency Use Authorization (EUA). This EUA will remain  in effect (meaning this test can  be used) for the duration of the COVID-19 declaration under Section 564(b)(1) of the Act, 21 U.S.C. section 360bbb-3(b)(1), unless the authorization is terminated or revoked sooner.    Influenza A by PCR NEGATIVE NEGATIVE Final   Influenza B by PCR NEGATIVE NEGATIVE Final    Comment: (NOTE) The Xpert Xpress SARS-CoV-2/FLU/RSV assay is intended as an aid in  the diagnosis of influenza from Nasopharyngeal swab specimens and  should not be used as a sole basis for treatment. Nasal washings and  aspirates are unacceptable for Xpert Xpress SARS-CoV-2/FLU/RSV  testing. Fact Sheet for Patients: https://www.moore.com/ Fact Sheet for Healthcare Providers: https://www.young.biz/ This test is not yet approved or cleared by the [place] FDA and  has been authorized for detection and/or diagnosis of SARS-CoV-2 by  FDA under an Emergency Use Authorization (EUA). This EUA will remain  in effect (meaning this test can be used) for the duration of the  Covid-19 declaration under Section 564(b)(1) of the Act, 21  U.S.C. section  360bbb-3(b)(1), unless the authorization is  terminated or revoked. Performed at [facility], [address] [place], [place] [address]     Procedures and diagnostic studies:  CT ANGIO HEAD W OR WO CONTRAST  Result Date: 10/19/2019 CLINICAL DATA:  19 year old female code stroke presentation with right facial droop and arm drift. Symptom onset 1330 hours. EXAM: CT ANGIOGRAPHY HEAD AND NECK CT PERFUSION BRAIN TECHNIQUE: Multidetector CT imaging of the head and neck was performed using the standard protocol during bolus administration of intravenous contrast. Multiplanar CT image reconstructions and MIPs were obtained to evaluate the vascular anatomy. Carotid stenosis measurements (when applicable) are obtained utilizing NASCET criteria, using the distal internal carotid diameter as the denominator. Multiphase CT imaging of the brain was performed following IV bolus contrast injection. Subsequent parametric perfusion maps were calculated using RAPID software. CONTRAST:  OMNIPAQUE IOHEXOL 350 MG/ML SOLN COMPARISON:  Plain head CT today 1345 hours. FINDINGS: CT Brain Perfusion Findings: ASPECTS: 10 CBF (<30%) Volume: None Perfusion (Tmax>6.0s) volume: None. Although there is a small volume of left posterior MCA region T-max > 4s volume detected. Mismatch Volume: Not applicable Infarction Location:Not applicable CTA NECK Skeleton: Negative. Upper chest: Negative; small volume residual thymus in the anterior superior mediastinum. Other neck: Within normal limits. Other findings: A repeat noncontrast head CT is also provided. Gray-white matter differentiation appears stable and within normal limits. No acute intracranial abnormality. Aortic arch: Aberrant origin of the right subclavian artery, the thus 4 vessel arch configuration. No arch atherosclerosis. Right carotid system: Negative. Left carotid system: Negative. Vertebral arteries: Aberrant origin right subclavian artery with no plaque or  stenosis. Normal right vertebral artery origin and negative cervical right vertebral artery. Normal proximal left subclavian artery. Dominant left vertebral artery is normal from the origin to the skull base. CTA HEAD Posterior circulation: Dominant left V4 segment. No distal vertebral plaque or stenosis. Patent PICA origins and vertebrobasilar junction. Patent basilar artery. Normal SCA and right PCA origins. The left P1 segment is highly tortuous but appears otherwise within normal limits. Bilateral PCA branches are within normal limits. Anterior circulation: Both ICA siphons are patent. And no siphon plaque or stenosis is identified. There is a mildly tortuous left posterior communicating artery on the left. Patent carotid termini. Normal MCA and ACA origins. Anterior communicating artery and bilateral ACA branches are within normal limits. Right MCA M1 segment and bifurcation are patent without stenosis. Right MCA branches are within normal limits. Left MCA M1 segment and bifurcation  are patent without stenosis. Left MCA branches are within normal limits. Venous sinuses: Patent. Dominant right transverse and sigmoid sinus. Anatomic variants: Aberrant origin right subclavian artery. Dominant left vertebral artery. Fetal type left PCA origin. Review of the MIP images confirms the above findings IMPRESSION: 1. Normal CTA head and neck, negative for ELVO. 2. CT perfusion is negative according to standard parameters, although there is a small volume of T-max > 4s parenchyma detected in the left MCA territory. 3. Repeat plain CT head appears stable and negative. This was discussed by telephone with Dr. Colon BranchMonks on 10/19/2019 at 17:50, who advised that the patient did receive IV tPA shortly before this exam. And we discussed the possibility that the patient has a distal branch ischemia accounting for #2. Noncontrast MRI would evaluate further. Electronically Signed   By: Odessa FlemingH  Hall M.D.   On: 10/19/2019 17:55   CT ANGIO  NECK W OR WO CONTRAST  Result Date: 10/19/2019 CLINICAL DATA:  19 year old female code stroke presentation with right facial droop and arm drift. Symptom onset 1330 hours. EXAM: CT ANGIOGRAPHY HEAD AND NECK CT PERFUSION BRAIN TECHNIQUE: Multidetector CT imaging of the head and neck was performed using the standard protocol during bolus administration of intravenous contrast. Multiplanar CT image reconstructions and MIPs were obtained to evaluate the vascular anatomy. Carotid stenosis measurements (when applicable) are obtained utilizing NASCET criteria, using the distal internal carotid diameter as the denominator. Multiphase CT imaging of the brain was performed following IV bolus contrast injection. Subsequent parametric perfusion maps were calculated using RAPID software. CONTRAST:  100mL OMNIPAQUE IOHEXOL 350 MG/ML SOLN COMPARISON:  Plain head CT today 1345 hours. FINDINGS: CT Brain Perfusion Findings: ASPECTS: 10 CBF (<30%) Volume: None Perfusion (Tmax>6.0s) volume: None. Although there is a small volume of left posterior MCA region T-max > 4s volume detected. Mismatch Volume: Not applicable Infarction Location:Not applicable CTA NECK Skeleton: Negative. Upper chest: Negative; small volume residual thymus in the anterior superior mediastinum. Other neck: Within normal limits. Other findings: A repeat noncontrast head CT is also provided. Gray-white matter differentiation appears stable and within normal limits. No acute intracranial abnormality. Aortic arch: Aberrant origin of the right subclavian artery, the thus 4 vessel arch configuration. No arch atherosclerosis. Right carotid system: Negative. Left carotid system: Negative. 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 Normal right vertebral artery origin and negative cervical right vertebral artery. Normal proximal left subclavian artery. Dominant left vertebral artery is normal from the origin to the skull  base. CTA HEAD Posterior circulation: Dominant left V4 segment. No distal vertebral plaque or stenosis. Patent PICA origins and vertebrobasilar junction. Patent basilar artery. Normal SCA and right PCA origins. The left P1 segment is highly tortuous but appears otherwise within normal limits. Bilateral PCA branches are within normal limits. Anterior circulation: Both ICA siphons are patent. And no siphon plaque or stenosis is identified. There is a mildly tortuous left posterior communicating artery on the left. Patent carotid termini. Normal MCA and ACA origins. Anterior communicating artery and bilateral ACA branches are within normal limits. Right MCA M1 segment and bifurcation are patent without stenosis. Right MCA branches are within normal limits. Left MCA M1 segment and bifurcation are patent without stenosis. Left MCA branches are within normal limits. Venous sinuses: Patent. Dominant right transverse and sigmoid sinus. Anatomic variants: Aberrant origin right subclavian artery. Dominant left vertebral artery. Fetal type left PCA origin. Review of the MIP images confirms the above findings IMPRESSION: 1. Normal CTA  head and neck, negative for ELVO. 2. CT perfusion is negative according to standard parameters, although there is a small volume of T-max > 4s parenchyma detected in the left MCA territory. 3. Repeat plain CT head appears stable and negative. This was discussed by telephone with Dr. Colon Branch on 10/19/2019 at 17:50, who advised that the patient did receive IV tPA shortly before this exam. And we discussed the possibility that the patient has a distal branch ischemia accounting for #2. Noncontrast MRI would evaluate further. Electronically Signed   By: Odessa Fleming M.D.   On: 10/19/2019 17:55   MR BRAIN WO CONTRAST  Result Date: 10/19/2019 CLINICAL DATA:  Right facial droop and arm drift EXAM: MRI HEAD WITHOUT CONTRAST TECHNIQUE: Multiplanar, multiecho pulse sequences of the brain and surrounding  structures were obtained without intravenous contrast. COMPARISON:  None. FINDINGS: BRAIN: No acute infarct, acute hemorrhage or extra-axial collection. Normal white matter signal for age. Normal volume of brain parenchyma and CSF spaces. Midline structures are normal. VASCULAR: Major flow voids are preserved. Susceptibility-sensitive sequences show no chronic microhemorrhage or superficial siderosis. SKULL AND UPPER CERVICAL SPINE: Normal calvarium and skull base. Visualized upper cervical spine and soft tissues are normal. SINUSES/ORBITS: No fluid levels or advanced mucosal thickening. No mastoid or middle ear effusion. Normal orbits. IMPRESSION: Normal brain MRI. Electronically Signed   By: Deatra Robinson M.D.   On: 10/19/2019 23:38   CT CEREBRAL PERFUSION W CONTRAST  Result Date: 10/19/2019 CLINICAL DATA:  19 year old female code stroke presentation with right facial droop and arm drift. Symptom onset 1330 hours. EXAM: CT ANGIOGRAPHY HEAD AND NECK CT PERFUSION BRAIN TECHNIQUE: Multidetector CT imaging of the head and neck was performed using the standard protocol during bolus administration of intravenous contrast. Multiplanar CT image reconstructions and MIPs were obtained to evaluate the vascular anatomy. Carotid stenosis measurements (when applicable) are obtained utilizing NASCET criteria, using the distal internal carotid diameter as the denominator. Multiphase CT imaging of the brain was performed following IV bolus contrast injection. Subsequent parametric perfusion maps were calculated using RAPID software. CONTRAST:  OMNIPAQUE IOHEXOL 350 MG/ML SOLN COMPARISON:  Plain head CT today 1345 hours. FINDINGS: CT Brain Perfusion Findings: ASPECTS: 10 CBF (<30%) Volume: None Perfusion (Tmax>6.0s) volume: None. Although there is a small volume of left posterior MCA region T-max > 4s volume detected. Mismatch Volume: Not applicable Infarction Location:Not applicable CTA NECK Skeleton: Negative. Upper  chest: Negative; small volume residual thymus in the anterior superior mediastinum. Other neck: Within normal limits. Other findings: A repeat noncontrast head CT is also provided. Gray-white matter differentiation appears stable and within normal limits. No acute intracranial abnormality. Aortic arch: Aberrant origin of the right subclavian artery, the thus 4 vessel arch configuration. No arch atherosclerosis. Right carotid system: Negative. Left carotid system: Negative. 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 Normal right vertebral artery origin and negative cervical right vertebral artery. Normal proximal left subclavian artery. Dominant left vertebral artery is normal from the origin to the skull base. CTA HEAD Posterior circulation: Dominant left V4 segment. No distal vertebral plaque or stenosis. Patent PICA origins and vertebrobasilar junction. Patent basilar artery. Normal SCA and right PCA origins. The left P1 segment is highly tortuous but appears otherwise within normal limits. Bilateral PCA branches are within normal limits. Anterior circulation: Both ICA siphons are patent. And no siphon plaque or stenosis is identified. There is a mildly tortuous left posterior communicating artery on the left. Patent carotid termini.  Normal MCA and ACA origins. Anterior communicating artery and bilateral ACA branches are within normal limits. Right MCA M1 segment and bifurcation are patent without stenosis. Right MCA branches are within normal limits. Left MCA M1 segment and bifurcation are patent without stenosis. Left MCA branches are within normal limits. Venous sinuses: Patent. Dominant right transverse and sigmoid sinus. Anatomic variants: Aberrant origin right subclavian artery. Dominant left vertebral artery. Fetal type left PCA origin. Review of the MIP images confirms the above findings IMPRESSION: 1. Normal CTA head and neck, negative for ELVO. 2. CT perfusion  is negative according to standard parameters, although there is a small volume of T-max > 4s parenchyma detected in the left MCA territory. 3. Repeat plain CT head appears stable and negative. This was discussed by telephone with Dr. Colon Branch on 10/19/2019 at 17:50, who advised that the patient did receive IV tPA shortly before this exam. And we discussed the possibility that the patient has a distal branch ischemia accounting for #2. Noncontrast MRI would evaluate further. Electronically Signed   By: Odessa Fleming M.D.   On: 10/19/2019 17:55   ECHOCARDIOGRAM COMPLETE BUBBLE STUDY  Result Date: 10/20/2019    ECHOCARDIOGRAM REPORT   Patient Name:   CALLAWAY HARDIGREE Date of Exam: 10/20/2019 Medical Rec #:  [id number]        Height:       64.0 in Accession #:    [id number]       Weight:       114.2 lb Date of Birth:  02-12-2001        BSA:          1.542 m Patient Age:    18 years         BP:           88/52 mmHg Patient Gender: F                HR:           82 bpm. Exam Location:  [facility] Procedure: 2D Echo, Color Doppler, Cardiac Doppler and Saline Contrast Bubble            Study Indications:     I63.9 Stroke  History:         Patient has no prior history of Echocardiogram examinations. No                  medical history.  Sonographer:     Humphrey Rolls RDCS (AE) Referring Phys:  [id number] Lurene Shadow Diagnosing Phys: Lorine Bears MD IMPRESSIONS  1. Left ventricular ejection fraction, by estimation, is 55 to 60%. The left ventricle has normal function. The left ventricle has no regional wall motion abnormalities. Left ventricular diastolic parameters were normal.  2. Right ventricular systolic function is normal. The right ventricular size is normal. Tricuspid regurgitation signal is inadequate for assessing PA pressure.  3. The mitral valve is normal in structure and function. No evidence of mitral valve regurgitation. No evidence of mitral stenosis.  4. The aortic valve is normal in structure and function. Aortic valve  regurgitation is not visualized. No aortic stenosis is present.  5. The inferior vena cava is normal in size with greater than 50% respiratory variability, suggesting right atrial pressure of 3 mmHg.  6. Agitated saline contrast bubble study was positive with shunting observed within 3-6 cardiac cycles suggestive of interatrial shunt. 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 FINDINGS  Left Ventricle: Left  ventricular ejection fraction, by estimation, is 55 to 60%. The left ventricle has normal function. The left ventricle has no regional wall motion abnormalities. The left ventricular internal cavity size was normal in size. There is  no left ventricular hypertrophy. Left ventricular diastolic parameters were normal. Right Ventricle: The right ventricular size is normal. No increase in right ventricular wall thickness. Right ventricular systolic function is normal. Tricuspid regurgitation signal is inadequate for assessing PA pressure. Left Atrium: Left atrial size was normal in size. Right Atrium: Right atrial size was normal in size. Pericardium: There is no evidence of pericardial effusion. Mitral Valve: The mitral valve is normal in structure and function. Normal mobility of the mitral valve leaflets. No evidence of mitral valve regurgitation. No evidence of mitral valve stenosis. MV peak gradient, 5.3 mmHg. The mean mitral valve gradient is 2.0 mmHg. Tricuspid Valve: The tricuspid valve is normal in structure. Tricuspid valve regurgitation is not demonstrated. No evidence of tricuspid stenosis. Aortic Valve: The aortic valve is normal in structure and function. Aortic valve regurgitation is not visualized. No aortic stenosis is present. Aortic valve mean gradient measures 4.0 mmHg. Aortic valve peak gradient measures 7.4 mmHg. Aortic valve area, by VTI measures 1.50 cm. Pulmonic Valve: The  pulmonic valve was normal in structure. Pulmonic valve regurgitation is trivial. No evidence of pulmonic stenosis. Aorta: The aortic root is normal in size and structure. Venous: The inferior vena cava is normal in size with greater than 50% respiratory variability, suggesting right atrial pressure of 3 mmHg. IAS/Shunts: The interatrial septum is aneurysmal. The interatrial septum was not well visualized. Agitated saline contrast was given intravenously to evaluate for intracardiac shunting. Agitated saline contrast bubble study was positive with shunting observed within 3-6 cardiac cycles suggestive of interatrial shunt.  LEFT VENTRICLE PLAX 2D LVIDd:         4.66 cm  Diastology LVIDs:         3.32 cm  LV e' lateral:   18.50 cm/s LV PW:         0.73 cm  LV E/e' lateral: 5.4 LV IVS:        0.48 cm  LV e' medial:    11.60 cm/s LVOT diam:     1.60 cm  LV E/e' medial:  8.6 LV SV:         44.64 ml LV SV Index:   28.95 LVOT Area:     2.01 cm  RIGHT VENTRICLE RV Basal diam:  2.31 cm LEFT ATRIUM             Index       RIGHT ATRIUM          Index LA diam:        2.60 cm 1.69 cm/m  RA Area:     7.11 cm LA Vol (A2C):   21.2 ml 13.75 ml/m RA Volume:   11.10 ml 7.20 ml/m LA Vol (A4C):   20.6 ml 13.36 ml/m LA Biplane Vol: 22.7 ml 14.72 ml/m  AORTIC VALVE                   PULMONIC VALVE AV Area (Vmax):    1.77 cm    PV Vmax:       1.09 m/s AV Area (Vmean):   1.57 cm    PV Vmean:      81.500 cm/s AV Area (VTI):     1.50 cm    PV VTI:        0.276  m AV Vmax:           136.00 cm/s PV Peak grad:  4.8 mmHg AV Vmean:          98.600 cm/s PV Mean grad:  3.0 mmHg AV VTI:            0.298 m AV Peak Grad:      7.4 mmHg AV Mean Grad:      4.0 mmHg LVOT Vmax:         120.00 cm/s LVOT Vmean:        77.100 cm/s LVOT VTI:          0.222 m LVOT/AV VTI ratio: 0.74  AORTA Ao Root diam: 2.30 cm MITRAL VALVE MV Area (PHT): 3.60 cm    SHUNTS MV Peak grad:  5.3 mmHg    Systemic VTI:  0.22 m MV Mean grad:  2.0 mmHg    Systemic Diam: 1.60  cm MV Vmax:       1.15 m/s MV Vmean:      55.0 cm/s MV Decel Time: 211 msec MV E velocity: 99.40 cm/s MV A velocity: 66.00 cm/s MV E/A ratio:  1.51 Lorine Bears MD Electronically signed by Lorine Bears MD Signature Date/Time: 10/20/2019/1:25:26 PM    Final    CT HEAD CODE STROKE WO CONTRAST`  Result Date: 10/19/2019 CLINICAL DATA:  Code stroke. 19 year old female with right facial droop, arm drift. Symptom onset 1330 hours. Images on this study became available at 3:57 pm on 10/19/2019. EXAM: CT HEAD WITHOUT CONTRAST TECHNIQUE: Contiguous axial images were obtained from the base of the skull through the vertex without intravenous contrast. COMPARISON:  Head CT 05/15/2018. FINDINGS: Brain: Cerebral volume remains normal. No midline shift, ventriculomegaly, mass effect, evidence of mass lesion, intracranial hemorrhage or evidence of cortically based acute infarction. Gray-white matter differentiation is within normal limits throughout the brain. Vascular: No suspicious intracranial vascular hyperdensity. Skull: Stable, negative. Sinuses/Orbits: Visualized paranasal sinuses and mastoids are clear. Other: Visualized orbits and scalp soft tissues are within normal limits. ASPECTS [facility] Stroke Program Early CT Score) Total score (0-10 with 10 being normal): 10 IMPRESSION: 1. Stable and Normal noncontrast CT appearance of the brain. ASPECTS 10. 2. Study discussed by telephone with Dr. Colon Branch on 10/19/2019 at 16:02 . Electronically Signed   By: Odessa Fleming M.D.   On: 10/19/2019 16:03    Medications:   .  stroke: mapping our early stages of recovery book   Does not apply Once  . Chlorhexidine Gluconate Cloth  6 each Topical Daily   Continuous Infusions:   LOS: 0 days   Manna Gose  [facility]     10/20/2019, 4:14 PM

## 2019-10-20 NOTE — H&P (View-Only) (Signed)
    [facility] has been requested to perform a transesophageal echocardiogram on Candice Villarreal for positive bubble study TTE and concern for stroke 2/2 PFO.  After careful review of history and examination, the risks and benefits of transesophageal echocardiogram have been explained including risks of esophageal damage, perforation (1:10,000 risk), bleeding, pharyngeal hematoma as well as other potential complications associated with conscious sedation including aspiration, arrhythmia, respiratory failure and death. Alternatives to treatment were discussed, questions were answered. Patient is willing to proceed.   Fain Francis D Riel Hirschman, PA-C 10/20/2019 3:26 PM  

## 2019-10-20 NOTE — Progress Notes (Signed)
    [facility] has been requested to perform a transesophageal echocardiogram on Candice Villarreal for positive bubble study TTE and concern for stroke 2/2 PFO.  After careful review of history and examination, the risks and benefits of transesophageal echocardiogram have been explained including risks of esophageal damage, perforation (1:10,000 risk), bleeding, pharyngeal hematoma as well as other potential complications associated with conscious sedation including aspiration, arrhythmia, respiratory failure and death. Alternatives to treatment were discussed, questions were answered. Patient is willing to proceed.   Lennon Alstrom, PA-C 10/20/2019 3:26 PM

## 2019-10-20 NOTE — Consult Note (Addendum)
Requesting Physician: Dr. Myriam ForehandAyiku     Chief Complaint: Aphasia, right side numbness  History obtained from: Patient and Chart   HPI:                                                                                                                                       Candice Villarreal is a 19 y.o. female with past medical history of migraines presents to the emergency department on 2/21 for intermittent right facial and right arm numbness followed by difficulty getting words out.  Symptoms associated with headache and nausea/vomiting.  According to the patient, around 1:30 PM she was at her friend's house and noticed that her right hand and arm felt numb and tingling along with the right side of her face lasted for approximately 5 minutes.  This was unusual for her, she did start having a headache shortly before the episode occurred.  The symptoms returned again after approximately 10 minutes, and lasted again for about 5 minutes.  It occurred third time on her way home and she took a Benadryl.  She then had 2 episodes of vomiting and her headache continued.  Continued to have intermittent numbness in her arm and face, and then progressed to mild weakness as well as difficulty getting words out which is when she was brought to [facility] ED.  She was evaluated by tele-neurology who documented NIH stroke scale of 4 (1 for level of consciousness, 2 for partial paralysis of the face, and 1 for mild to moderate aphasia).  CT head was unremarkable , CT angiogram negative for large vessel occlusion CT perfusion was also negative. Patient was administered IV TPA at 4:48 pm.  Her symptoms have subsequently resolved, she states somewhere around the nighttime.  MRI brain has been performed:  negative for acute stroke or any other acute abnormality.   On assessment, patient appears pleasant and no longer has any symptoms or neurological deficits. She states she has a history of migraines since an accident, often gets  headaches.  They are associated with nausea, photophobia, visual aura such as floaters but her presentation has never happened.   No known family history of clotting disorders, miscarriages. 20 yr old half brother had stroke and is a  long time diabetic.   Date last known well: 10/19/19 Time last known well: 1.30 PM tPA Given: yes NIHSS: 4 on arrival  Baseline MRS 0   Past Medical History:  Diagnosis Date  . Acute appendicitis 01/28/2019    Past Surgical History:  Procedure Laterality Date  . LAPAROSCOPIC APPENDECTOMY N/A 01/28/2019   Procedure: APPENDECTOMY LAPAROSCOPIC;  Surgeon: Ancil Linseyavis, Jason Evan, MD;  Location: [facility] ORS;  Service: General;  Laterality: N/A;    History reviewed. No pertinent family history. Social History:  reports that she has been smoking. She has never used smokeless tobacco. She reports previous alcohol use. She reports current drug use. Drug: Marijuana.  Allergies: No Known Allergies  Medications:                                                                                                                        I reviewed home medications   ROS:                                                                                                                                     14 systems reviewed and negative except above    Examination:                                                                                                      General: Appears well-developed Psych: Affect appropriate to situation Eyes: No scleral injection HENT: No OP obstrucion Head: Normocephalic.  Cardiovascular: Normal rate and regular rhythm.  Respiratory: Effort normal and breath sounds normal to anterior ascultation GI: Soft.  No distension. There is no tenderness.  Skin: WDI    Neurological Examination Mental Status: Alert, oriented, thought content appropriate.  Speech fluent without evidence of aphasia. Able to follow 3 step commands without  difficulty. Cranial Nerves: II: Visual fields grossly normal,  III,IV, VI: ptosis not present, extra-ocular motions intact bilaterally, pupils equal, round, reactive to light and accommodation V,VII: smile symmetric, facial light touch sensation normal bilaterally VIII: hearing normal bilaterally IX,X: uvula rises symmetrically XI: bilateral shoulder shrug XII: midline tongue extension Motor: Right : Upper extremity   5/5    Left:     Upper extremity   5/5  Lower extremity   5/5     Lower extremity   5/5 Tone and bulk:normal tone throughout; no atrophy noted Sensory: Pinprick and light touch intact throughout, bilaterally Deep Tendon Reflexes: 2+ and symmetric throughout Plantars: Right: downgoing   Left: downgoing Cerebellar: normal finger-to-nose, normal rapid alternating movements and normal heel-to-shin test Gait: normal gait and station     Lab Results: Basic Metabolic Panel: Recent Labs  Lab 10/19/19 1601  NA 138  K 3.6  CL 104  CO2 26  GLUCOSE 103*  BUN 13  CREATININE 0.82  CALCIUM 9.0    CBC: Recent Labs  Lab 10/19/19 1601 10/20/19 0837  WBC 14.1* 13.2*  NEUTROABS 10.7* 9.3*  HGB 15.6* 13.1  HCT 45.4 38.3  MCV 84.9 84.9  PLT 294 268    Coagulation Studies: Recent Labs    10/19/19 1601  LABPROT 11.7  INR 0.9    Imaging: CT ANGIO HEAD W OR WO CONTRAST  Result Date: 10/19/2019 CLINICAL DATA:  19 year old female code stroke presentation with right facial droop and arm drift. Symptom onset 1330 hours. EXAM: CT ANGIOGRAPHY HEAD AND NECK CT PERFUSION BRAIN TECHNIQUE: Multidetector CT imaging of the head and neck was performed using the standard protocol during bolus administration of intravenous contrast. Multiplanar CT image reconstructions and MIPs were obtained to evaluate the vascular anatomy. Carotid stenosis measurements (when applicable) are obtained utilizing NASCET criteria, using the distal internal carotid diameter as the denominator.  Multiphase CT imaging of the brain was performed following IV bolus contrast injection. Subsequent parametric perfusion maps were calculated using RAPID software. CONTRAST:  OMNIPAQUE IOHEXOL 350 MG/ML SOLN COMPARISON:  Plain head CT today 1345 hours. FINDINGS: CT Brain Perfusion Findings: ASPECTS: 10 CBF (<30%) Volume: None Perfusion (Tmax>6.0s) volume: None. Although there is a small volume of left posterior MCA region T-max > 4s volume detected. Mismatch Volume: Not applicable Infarction Location:Not applicable CTA NECK Skeleton: Negative. Upper chest: Negative; small volume residual thymus in the anterior superior mediastinum. Other neck: Within normal limits. Other findings: A repeat noncontrast head CT is also provided. Gray-white matter differentiation appears stable and within normal limits. No acute intracranial abnormality. Aortic arch: Aberrant origin of the right subclavian artery, the thus 4 vessel arch configuration. No arch atherosclerosis. Right carotid system: Negative. Left carotid system: Negative. 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 Normal right vertebral artery origin and negative cervical right vertebral artery. Normal proximal left subclavian artery. Dominant left vertebral artery is normal from the origin to the skull base. CTA HEAD Posterior circulation: Dominant left V4 segment. No distal vertebral plaque or stenosis. Patent PICA origins and vertebrobasilar junction. Patent basilar artery. Normal SCA and right PCA origins. The left P1 segment is highly tortuous but appears otherwise within normal limits. Bilateral PCA branches are within normal limits. Anterior circulation: Both ICA siphons are patent. And no siphon plaque or stenosis is identified. There is a mildly tortuous left posterior communicating artery on the left. Patent carotid termini. Normal MCA and ACA origins. Anterior communicating artery and bilateral ACA branches  are within normal limits. Right MCA M1 segment and bifurcation are patent without stenosis. Right MCA branches are within normal limits. Left MCA M1 segment and bifurcation are patent without stenosis. Left MCA branches are within normal limits. Venous sinuses: Patent. Dominant right transverse and sigmoid sinus. Anatomic variants: Aberrant origin right subclavian artery. Dominant left vertebral artery. Fetal type left PCA origin. Review of the MIP images confirms the above findings IMPRESSION: 1. Normal CTA head and neck, negative for ELVO. 2. CT perfusion is negative according to standard parameters, although there is a small volume of T-max > 4s parenchyma detected in the left MCA territory. 3. Repeat plain CT head appears stable and negative. This was discussed by telephone with Dr. Colon Branch on 10/19/2019 at 17:50, who advised that the patient did receive IV tPA shortly before this exam. And we discussed the possibility  that the patient has a distal branch ischemia accounting for #2. Noncontrast MRI would evaluate further. Electronically Signed   By: Genevie Ann M.D.   On: 10/19/2019 17:55   CT ANGIO NECK W OR WO CONTRAST  Result Date: 10/19/2019 CLINICAL DATA:  19 year old female code stroke presentation with right facial droop and arm drift. Symptom onset 1330 hours. EXAM: CT ANGIOGRAPHY HEAD AND NECK CT PERFUSION BRAIN TECHNIQUE: Multidetector CT imaging of the head and neck was performed using the standard protocol during bolus administration of intravenous contrast. Multiplanar CT image reconstructions and MIPs were obtained to evaluate the vascular anatomy. Carotid stenosis measurements (when applicable) are obtained utilizing NASCET criteria, using the distal internal carotid diameter as the denominator. Multiphase CT imaging of the brain was performed following IV bolus contrast injection. Subsequent parametric perfusion maps were calculated using RAPID software. CONTRAST:  147mL OMNIPAQUE IOHEXOL 350 MG/ML  SOLN COMPARISON:  Plain head CT today 1345 hours. FINDINGS: CT Brain Perfusion Findings: ASPECTS: 10 CBF (<30%) Volume: None Perfusion (Tmax>6.0s) volume: None. Although there is a small volume of left posterior MCA region T-max > 4s volume detected. Mismatch Volume: Not applicable Infarction Location:Not applicable CTA NECK Skeleton: Negative. Upper chest: Negative; small volume residual thymus in the anterior superior mediastinum. Other neck: Within normal limits. Other findings: A repeat noncontrast head CT is also provided. Gray-white matter differentiation appears stable and within normal limits. No acute intracranial abnormality. Aortic arch: Aberrant origin of the right subclavian artery, the thus 4 vessel arch configuration. No arch atherosclerosis. Right carotid system: Negative. Left carotid system: Negative. 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 Normal right vertebral artery origin and negative cervical right vertebral artery. Normal proximal left subclavian artery. Dominant left vertebral artery is normal from the origin to the skull base. CTA HEAD Posterior circulation: Dominant left V4 segment. No distal vertebral plaque or stenosis. Patent PICA origins and vertebrobasilar junction. Patent basilar artery. Normal SCA and right PCA origins. The left P1 segment is highly tortuous but appears otherwise within normal limits. Bilateral PCA branches are within normal limits. Anterior circulation: Both ICA siphons are patent. And no siphon plaque or stenosis is identified. There is a mildly tortuous left posterior communicating artery on the left. Patent carotid termini. Normal MCA and ACA origins. Anterior communicating artery and bilateral ACA branches are within normal limits. Right MCA M1 segment and bifurcation are patent without stenosis. Right MCA branches are within normal limits. Left MCA M1 segment and bifurcation are patent without stenosis. Left MCA  branches are within normal limits. Venous sinuses: Patent. Dominant right transverse and sigmoid sinus. Anatomic variants: Aberrant origin right subclavian artery. Dominant left vertebral artery. Fetal type left PCA origin. Review of the MIP images confirms the above findings IMPRESSION: 1. Normal CTA head and neck, negative for ELVO. 2. CT perfusion is negative according to standard parameters, although there is a small volume of T-max > 4s parenchyma detected in the left MCA territory. 3. Repeat plain CT head appears stable and negative. This was discussed by telephone with Dr. Joan Mayans on 10/19/2019 at 17:50, who advised that the patient did receive IV tPA shortly before this exam. And we discussed the possibility that the patient has a distal branch ischemia accounting for #2. Noncontrast MRI would evaluate further. Electronically Signed   By: Genevie Ann M.D.   On: 10/19/2019 17:55   MR BRAIN WO CONTRAST  Result Date: 10/19/2019 CLINICAL DATA:  Right facial droop and arm drift  EXAM: MRI HEAD WITHOUT CONTRAST TECHNIQUE: Multiplanar, multiecho pulse sequences of the brain and surrounding structures were obtained without intravenous contrast. COMPARISON:  None. FINDINGS: BRAIN: No acute infarct, acute hemorrhage or extra-axial collection. Normal white matter signal for age. Normal volume of brain parenchyma and CSF spaces. Midline structures are normal. VASCULAR: Major flow voids are preserved. Susceptibility-sensitive sequences show no chronic microhemorrhage or superficial siderosis. SKULL AND UPPER CERVICAL SPINE: Normal calvarium and skull base. Visualized upper cervical spine and soft tissues are normal. SINUSES/ORBITS: No fluid levels or advanced mucosal thickening. No mastoid or middle ear effusion. Normal orbits. IMPRESSION: Normal brain MRI. Electronically Signed   By: Deatra Robinson M.D.   On: 10/19/2019 23:38   CT CEREBRAL PERFUSION W CONTRAST  Result Date: 10/19/2019 CLINICAL DATA:  19 year old female  code stroke presentation with right facial droop and arm drift. Symptom onset 1330 hours. EXAM: CT ANGIOGRAPHY HEAD AND NECK CT PERFUSION BRAIN TECHNIQUE: Multidetector CT imaging of the head and neck was performed using the standard protocol during bolus administration of intravenous contrast. Multiplanar CT image reconstructions and MIPs were obtained to evaluate the vascular anatomy. Carotid stenosis measurements (when applicable) are obtained utilizing NASCET criteria, using the distal internal carotid diameter as the denominator. Multiphase CT imaging of the brain was performed following IV bolus contrast injection. Subsequent parametric perfusion maps were calculated using RAPID software. CONTRAST:  OMNIPAQUE IOHEXOL 350 MG/ML SOLN COMPARISON:  Plain head CT today 1345 hours. FINDINGS: CT Brain Perfusion Findings: ASPECTS: 10 CBF (<30%) Volume: None Perfusion (Tmax>6.0s) volume: None. Although there is a small volume of left posterior MCA region T-max > 4s volume detected. Mismatch Volume: Not applicable Infarction Location:Not applicable CTA NECK Skeleton: Negative. Upper chest: Negative; small volume residual thymus in the anterior superior mediastinum. Other neck: Within normal limits. Other findings: A repeat noncontrast head CT is also provided. Gray-white matter differentiation appears stable and within normal limits. No acute intracranial abnormality. Aortic arch: Aberrant origin of the right subclavian artery, the thus 4 vessel arch configuration. No arch atherosclerosis. Right carotid system: Negative. Left carotid system: Negative. 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 Normal right vertebral artery origin and negative cervical right vertebral artery. Normal proximal left subclavian artery. Dominant left vertebral artery is normal from the origin to the skull base. CTA HEAD Posterior circulation: Dominant left V4 segment. No distal vertebral  plaque or stenosis. Patent PICA origins and vertebrobasilar junction. Patent basilar artery. Normal SCA and right PCA origins. The left P1 segment is highly tortuous but appears otherwise within normal limits. Bilateral PCA branches are within normal limits. Anterior circulation: Both ICA siphons are patent. And no siphon plaque or stenosis is identified. There is a mildly tortuous left posterior communicating artery on the left. Patent carotid termini. Normal MCA and ACA origins. Anterior communicating artery and bilateral ACA branches are within normal limits. Right MCA M1 segment and bifurcation are patent without stenosis. Right MCA branches are within normal limits. Left MCA M1 segment and bifurcation are patent without stenosis. Left MCA branches are within normal limits. Venous sinuses: Patent. Dominant right transverse and sigmoid sinus. Anatomic variants: Aberrant origin right subclavian artery. Dominant left vertebral artery. Fetal type left PCA origin. Review of the MIP images confirms the above findings IMPRESSION: 1. Normal CTA head and neck, negative for ELVO. 2. CT perfusion is negative according to standard parameters, although there is a small volume of T-max > 4s parenchyma detected in the left MCA territory.  3. Repeat plain CT head appears stable and negative. This was discussed by telephone with Dr. Colon Branch on 10/19/2019 at 17:50, who advised that the patient did receive IV tPA shortly before this exam. And we discussed the possibility that the patient has a distal branch ischemia accounting for #2. Noncontrast MRI would evaluate further. Electronically Signed   By: Odessa Fleming M.D.   On: 10/19/2019 17:55   CT HEAD CODE STROKE WO CONTRAST`  Result Date: 10/19/2019 CLINICAL DATA:  Code stroke. 19 year old female with right facial droop, arm drift. Symptom onset 1330 hours. Images on this study became available at 3:57 pm on 10/19/2019. EXAM: CT HEAD WITHOUT CONTRAST TECHNIQUE: Contiguous axial images  were obtained from the base of the skull through the vertex without intravenous contrast. COMPARISON:  Head CT 05/15/2018. FINDINGS: Brain: Cerebral volume remains normal. No midline shift, ventriculomegaly, mass effect, evidence of mass lesion, intracranial hemorrhage or evidence of cortically based acute infarction. Gray-white matter differentiation is within normal limits throughout the brain. Vascular: No suspicious intracranial vascular hyperdensity. Skull: Stable, negative. Sinuses/Orbits: Visualized paranasal sinuses and mastoids are clear. Other: Visualized orbits and scalp soft tissues are within normal limits. ASPECTS [facility] Stroke Program Early CT Score) Total score (0-10 with 10 being normal): 10 IMPRESSION: 1. Stable and Normal noncontrast CT appearance of the brain. ASPECTS 10. 2. Study discussed by telephone with Dr. Colon Branch on 10/19/2019 at 16:02 . Electronically Signed   By: Odessa Fleming M.D.   On: 10/19/2019 16:03     ASSESSMENT AND PLAN  19 y.o. female with past medical history of migraines presents to the emergency department on 2/21 for intermittent right facial and right arm numbness followed by difficulty getting words out associated with headache and nausea.  I suspect her presentation is probably due to complex migraine given negative MRI brain and her young age without risk factors, as well as known history of migraines. However upon presentation- teleneurologist was concerned for a stroke and gave tPA.  She did have a right facial droop on his assessment.  CTP also shows possible hypoperfusion in the left parietal lobe although this could be artifactual. It could be possible patient had a distal thrombus-that recanalized after alteplase, thereby aborting the stroke.   Currently back to her baseline.  Will obtain Echo to rule out PFO.  HIV is negative.  A1c is normal. LDL 86. Urine pregnancy negative. Neg for Coronavirus.  Reviewed vascular imaging-negative for FMD, aneurysm or  vasoconstriction.   Acute onset intermittent right-sided numbness and aphasia s/p IV TPA D/D includes complex migraine vs aborted stroke  Recommendations -Echocardiogram with bubble study to look for PFO, if present would obtain bilateral lower extremity Dopplers -Telemetry -AIC and Lipid profile -Repeat CT head in 24 hours from time of tPA to rule out hemorrhage -We will hold off starting aspirin, statin for now   Addendum  Echo with bubble study positive for PFO While her presentation still could be migraine, I do think aborted stroke with paradoxical embolus is fairly high suspicion.  ROPE ( risk of paradoxical embolus) score is  8/9  Requested Cards for TEE to assess size, presence of Atrial septal aneurysm and hypermobility which would indicate high risk PFO requiring closure Will check hypercoagulable workup and [place] to r/o LE DVT.    Updated plan to patient and husband.     Demica Zook [facility] Pager Number [phone]

## 2019-10-20 NOTE — Progress Notes (Signed)
Patient has done well tonight.  Scoring a 0 on NIH (q 2hour) assessments.  Complained of a 7/10 headache, for which she received Tylenol 650mg  at 2209.  Patient was taken down for MRI and was able to move fluidly across bed to the MRI bed.  She talked with spouse, 2210, before going down to MRI and upon return to the room after MRI.    Patient has a keen sense of safety, requesting assistance when need to use restroom.  She was able to get up to [facility] with a steady and secure movements, as I assisted with a guiding hand for comfort.   At 0200 assessment, patient complained, again of a 2/10 headache and pain in her belly.  She stated the pain she felt in her belly was due to hunger.  She did not require medication for the headache.  Also, she states numbness and tingling is gone.  At 0400 and 0600 NIH assessments, patient denies headache.

## 2019-10-20 NOTE — Progress Notes (Signed)
OT Cancellation Note  Patient Details Name: Candice Villarreal MRN: [id number] DOB: Mar 20, 2001   Cancelled Treatment:    Reason Eval/Treat Not Completed: Patient not medically ready   Patient not medically ready:  TPA administered 10/19/19 at 4:48 pm.  Therapy to be held 24 hrs and after follow up head CT/MRI performed.  Will attempt at later time, as able and as medically appropriate.  Wynn Maudlin 10/20/2019, 1:10 PM

## 2019-10-20 NOTE — Progress Notes (Signed)
PT Cancellation Note  Patient Details Name: Candice Villarreal MRN: [id number] DOB: 04-May-2001   Cancelled Treatment:    Reason Eval/Treat Not Completed: Patient not medically ready:  TPA administered 10/19/19 at 4:48 pm.  Per guidelines PT to be held 24 hrs and after follow up head CT/MRI performed.  Will attempt to see pt at a future date/time as medically appropriate.    Ovidio Hanger PT, DPT 10/20/19, 9:44 AM

## 2019-10-20 NOTE — Progress Notes (Signed)
*  PRELIMINARY RESULTS* Echocardiogram 2D Echocardiogram has been performed.  Candice Villarreal [name] 10/20/2019, 12:34 PM

## 2019-10-21 ENCOUNTER — Inpatient Hospital Stay (HOSPITAL_COMMUNITY)
Admit: 2019-10-21 | Discharge: 2019-10-21 | Disposition: A | Discharge status: Home / Self Care | Payer: Medicaid Other | Attending: Physician Assistant | Admitting: Physician Assistant

## 2019-10-21 ENCOUNTER — Encounter
Admission: EM | Disposition: A | Discharge status: Home / Self Care | Payer: Self-pay | Source: Home / Self Care | Attending: Internal Medicine

## 2019-10-21 DIAGNOSIS — Q211 Atrial septal defect: Secondary | ICD-10-CM

## 2019-10-21 DIAGNOSIS — Z720 Tobacco use: Secondary | ICD-10-CM

## 2019-10-21 DIAGNOSIS — G459 Transient cerebral ischemic attack, unspecified: Secondary | ICD-10-CM

## 2019-10-21 HISTORY — PX: TEE WITHOUT CARDIOVERSION: SHX5443

## 2019-10-21 LAB — HIV ANTIBODY (ROUTINE TESTING W REFLEX): HIV Screen 4th Generation wRfx: NONREACTIVE — AB

## 2019-10-21 LAB — ANTITHROMBIN III: AntiThromb III Func: 116 % (ref 75–120)

## 2019-10-21 SURGERY — ECHOCARDIOGRAM, TRANSESOPHAGEAL
Anesthesia: Moderate Sedation

## 2019-10-21 MED ORDER — MIDAZOLAM HCL 5 MG/5ML IJ SOLN
INTRAMUSCULAR | Status: AC | PRN
  Administered 2019-10-21: 1 mg via INTRAVENOUS
  Administered 2019-10-21: 2 mg via INTRAVENOUS
  Administered 2019-10-21 (×2): 1 mg via INTRAVENOUS

## 2019-10-21 MED ORDER — LIDOCAINE VISCOUS HCL 2 % MT SOLN
OROMUCOSAL | Status: AC
  Filled 2019-10-21: qty 15

## 2019-10-21 MED ORDER — MIDAZOLAM HCL 2 MG/2ML IJ SOLN
INTRAMUSCULAR | Status: AC | PRN
  Administered 2019-10-21: 1 mg via INTRAVENOUS

## 2019-10-21 MED ORDER — STROKE: EARLY STAGES OF RECOVERY BOOK: 1.0000 | Freq: Once | 0 refills | Status: AC

## 2019-10-21 MED ORDER — SODIUM CHLORIDE 0.9 % IV SOLN: INTRAVENOUS | Status: DC

## 2019-10-21 MED ORDER — MIDAZOLAM HCL 5 MG/5ML IJ SOLN
INTRAMUSCULAR | Status: AC
  Filled 2019-10-21: qty 5

## 2019-10-21 MED ORDER — FENTANYL CITRATE (PF) 100 MCG/2ML IJ SOLN
INTRAMUSCULAR | Status: AC | PRN
  Administered 2019-10-21 (×4): 25 ug via INTRAVENOUS

## 2019-10-21 MED ORDER — MIDAZOLAM HCL 2 MG/2ML IJ SOLN
INTRAMUSCULAR | Status: AC
  Filled 2019-10-21: qty 2

## 2019-10-21 MED ORDER — ATORVASTATIN CALCIUM 40 MG PO TABS: 40.0000 mg | ORAL_TABLET | Freq: Every day | ORAL | 1 refills | Status: DC

## 2019-10-21 MED ORDER — FENTANYL CITRATE (PF) 100 MCG/2ML IJ SOLN
INTRAMUSCULAR | Status: AC
  Filled 2019-10-21: qty 2

## 2019-10-21 MED ORDER — SODIUM CHLORIDE FLUSH 0.9 % IV SOLN
INTRAVENOUS | Status: AC
  Filled 2019-10-21: qty 10

## 2019-10-21 NOTE — Consult Note (Signed)
Cardiology Consultation:   Patient ID: Candice Villarreal MRN: [id number]; DOB: 2000/12/28  Admit date: 10/19/2019 Date of Consult: 10/21/2019  Primary Care Provider: Patient, No Pcp Per Primary Cardiologist: Yvonne Kendallhristopher Glover Capano, MD Primary Electrophysiologist:  None    Patient Profile:   Candice Villarreal is a 19 y.o. female with a hx of migraines and appenditicitis who is being seen today for the evaluation of stroke and intracardiac shunt at the request of DR. Denton LankGriffith.  History of Present Illness:   [name] reports having acute onset of right-sided numbness and weakness as well as speech difficulty two days ago.  The symptoms came and went but ultimately led her to come to [facility] ED with the assistance of a friend.  Upon arrival, she was noted to have word finding difficulty and received TPA.  CT head and MRI brain did not show evidence of infarct.  It was thought that her presentation could represent complex migraine versus aborted stroke.  Echocardiogram with bubble study yesterday was notable for right to left shunt.  Currently, [name] reports feeling back to baseline without residual neurologic deficit.  She denies a history of prior cardiac disease and has not experienced chest pain, shortness of breath, palpitations, and edema.  Heart Pathway Score:     Past Medical History:  Diagnosis Date  . Acute appendicitis 01/28/2019    Past Surgical History:  Procedure Laterality Date  . LAPAROSCOPIC APPENDECTOMY N/A 01/28/2019   Procedure: APPENDECTOMY LAPAROSCOPIC;  Surgeon: Ancil Linseyavis, Jason Evan, MD;  Location: [facility] ORS;  Service: General;  Laterality: N/A;     Home Medications:  Prior to Admission medications   Not on File    Inpatient Medications: Scheduled Meds: . [MAR Hold]  stroke: mapping our early stages of recovery book   Does not apply Once  . [MAR Hold] Chlorhexidine Gluconate Cloth  6 each Topical Daily  . fentaNYL      . lidocaine      . midazolam      .  midazolam      . sodium chloride flush       Continuous Infusions:  PRN Meds: [MAR Hold] acetaminophen, [MAR Hold] ondansetron (ZOFRAN) IV, [MAR Hold] senna-docusate  Allergies:   No Known Allergies  Social History:   Social History   Socioeconomic History  . Marital status: Single    Spouse name: Not on file  . Number of children: Not on file  . Years of education: Not on file  . Highest education level: Not on file  Occupational History  . Not on file  Tobacco Use  . Smoking status: Current Every Day Smoker  . Smokeless tobacco: Never Used  Substance and Sexual Activity  . Alcohol use: Not Currently  . Drug use: Yes    Types: Marijuana  . Sexual activity: Not on file  Other Topics Concern  . Not on file  Social History Narrative  . Not on file   Social Determinants of Health   Financial Resource Strain: Low Risk   . Difficulty of Paying Living Expenses: Not hard at all  Food Insecurity: No Food Insecurity  . Worried About Programme researcher, broadcasting/film/videounning Out of Food in the Last Year: Never true  . Ran Out of Food in the Last Year: Never true  Transportation Needs: No Transportation Needs  . Lack of Transportation (Medical): No  . Lack of Transportation (Non-Medical): No  Physical Activity: Sufficiently Active  . Days of Exercise per Week: 5 days  . Minutes of Exercise per  Session: 60 min  Stress: Stress Concern Present  . Feeling of Stress : To some extent  Social Connections: Unknown  . Frequency of Communication with Friends and Family: More than three times a week  . Frequency of Social Gatherings with Friends and Family: More than three times a week  . Attends Religious Services: Patient refused  . Active Member of Clubs or Organizations: Patient refused  . Attends Banker Meetings: Patient refused  . Marital Status: Living with partner  Intimate Partner Violence: Not At Risk  . Fear of Current or Ex-Partner: No  . Emotionally Abused: No  . Physically Abused: No    . Sexually Abused: No    Family History:   History reviewed. No pertinent family history.   ROS:  Please see the history of present illness. All other ROS reviewed and negative.     Physical Exam/Data:   Vitals:   10/21/19 1040 10/21/19 1045 10/21/19 1100 10/21/19 1115  BP: (!) 103/56 (!) 105/59 (!) 98/57 (!) 91/53  Pulse: 78 77 68 64  Resp: 17 16 11 10   Temp:      TempSrc:      SpO2: 100% 98% 100% 99%  Weight:      Height:        Intake/Output Summary (Last 24 hours) at 10/21/2019 1128 Last data filed at 10/21/2019 0600 Gross per 24 hour  Intake 400 ml  Output 300 ml  Net 100 ml   Last 3 Weights 10/21/2019 10/19/2019 10/19/2019  Weight (lbs) 114 lb 3.2 oz 114 lb 3.2 oz 111 lb  Weight (kg) 51.8 kg 51.8 kg 50.349 kg     Body mass index is 19.6 kg/m.  General:  Well nourished, well developed, in no acute distress HEENT: normal Lymph: no adenopathy Neck: no JVD Endocrine:  No thryomegaly Vascular: No carotid bruits; 2+ radial pulses bilaterally Cardiac:  normal S1, S2; RRR; no murmurs, rubs, or gallops Lungs:  clear to auscultation bilaterally, no wheezing, rhonchi or rales  Abd: soft, nontender, no hepatomegaly  Ext: no edema Musculoskeletal:  No deformities, BUE and BLE strength normal and equal Skin: warm and dry  Neuro:  CNs 2-12 intact, no focal abnormalities noted Psych:  Normal affect   EKG:  The EKG was personally reviewed and demonstrates: Normal sinus rhythm without significant abnormality. Telemetry:  Telemetry was personally reviewed and demonstrates: Normal sinus rhythm with isolated PACs versus sinus arrhythmia.  Relevant CV Studies: TTE (10/20/2019): 1. Left ventricular ejection fraction, by estimation, is 55 to 60%. The  left ventricle has normal function. The left ventricle has no regional  wall motion abnormalities. Left ventricular diastolic parameters were  normal.  2. Right ventricular systolic function is normal. The right ventricular  size  is normal. Tricuspid regurgitation signal is inadequate for assessing  PA pressure.  3. The mitral valve is normal in structure and function. No evidence of  mitral valve regurgitation. No evidence of mitral stenosis.  4. The aortic valve is normal in structure and function. Aortic valve  regurgitation is not visualized. No aortic stenosis is present.  5. The inferior vena cava is normal in size with greater than 50%  respiratory variability, suggesting right atrial pressure of 3 mmHg.  6. Agitated saline contrast bubble study was positive with shunting  observed within 3-6 cardiac cycles suggestive of interatrial shunt.   Laboratory Data:  High Sensitivity Troponin:  No results for input(s): TROPONINIHS in the last 720 hours.   Chemistry Recent Labs  Lab 10/19/19 1601  NA 138  K 3.6  CL 104  CO2 26  GLUCOSE 103*  BUN 13  CREATININE 0.82  CALCIUM 9.0  GFRNONAA >60  GFRAA >60  ANIONGAP 8    Recent Labs  Lab 10/19/19 1601  PROT 7.2  ALBUMIN 4.3  AST 17  ALT 12  ALKPHOS 48  BILITOT 1.0   Hematology Recent Labs  Lab 10/19/19 1601 10/20/19 0837  WBC 14.1* 13.2*  RBC 5.35* 4.51  HGB 15.6* 13.1  HCT 45.4 38.3  MCV 84.9 84.9  MCH 29.2 29.0  MCHC 34.4 34.2  RDW 11.9 12.1  PLT 294 268   BNPNo results for input(s): BNP, PROBNP in the last 168 hours.  DDimer No results for input(s): DDIMER in the last 168 hours.   Radiology/Studies:  CT ANGIO HEAD W OR WO CONTRAST  Result Date: 10/19/2019 CLINICAL DATA:  19 year old female code stroke presentation with right facial droop and arm drift. Symptom onset 1330 hours. EXAM: CT ANGIOGRAPHY HEAD AND NECK CT PERFUSION BRAIN TECHNIQUE: Multidetector CT imaging of the head and neck was performed using the standard protocol during bolus administration of intravenous contrast. Multiplanar CT image reconstructions and MIPs were obtained to evaluate the vascular anatomy. Carotid stenosis measurements (when applicable) are  obtained utilizing NASCET criteria, using the distal internal carotid diameter as the denominator. Multiphase CT imaging of the brain was performed following IV bolus contrast injection. Subsequent parametric perfusion maps were calculated using RAPID software. CONTRAST:  OMNIPAQUE IOHEXOL 350 MG/ML SOLN COMPARISON:  Plain head CT today 1345 hours. FINDINGS: CT Brain Perfusion Findings: ASPECTS: 10 CBF (<30%) Volume: None Perfusion (Tmax>6.0s) volume: None. Although there is a small volume of left posterior MCA region T-max > 4s volume detected. Mismatch Volume: Not applicable Infarction Location:Not applicable CTA NECK Skeleton: Negative. Upper chest: Negative; small volume residual thymus in the anterior superior mediastinum. Other neck: Within normal limits. Other findings: A repeat noncontrast head CT is also provided. Gray-white matter differentiation appears stable and within normal limits. No acute intracranial abnormality. Aortic arch: Aberrant origin of the right subclavian artery, the thus 4 vessel arch configuration. No arch atherosclerosis. Right carotid system: Negative. Left carotid system: Negative. 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 Normal right vertebral artery origin and negative cervical right vertebral artery. Normal proximal left subclavian artery. Dominant left vertebral artery is normal from the origin to the skull base. CTA HEAD Posterior circulation: Dominant left V4 segment. No distal vertebral plaque or stenosis. Patent PICA origins and vertebrobasilar junction. Patent basilar artery. Normal SCA and right PCA origins. The left P1 segment is highly tortuous but appears otherwise within normal limits. Bilateral PCA branches are within normal limits. Anterior circulation: Both ICA siphons are patent. And no siphon plaque or stenosis is identified. There is a mildly tortuous left posterior communicating artery on the left. Patent  carotid termini. Normal MCA and ACA origins. Anterior communicating artery and bilateral ACA branches are within normal limits. Right MCA M1 segment and bifurcation are patent without stenosis. Right MCA branches are within normal limits. Left MCA M1 segment and bifurcation are patent without stenosis. Left MCA branches are within normal limits. Venous sinuses: Patent. Dominant right transverse and sigmoid sinus. Anatomic variants: Aberrant origin right subclavian artery. Dominant left vertebral artery. Fetal type left PCA origin. Review of the MIP images confirms the above findings IMPRESSION: 1. Normal CTA head and neck, negative for ELVO. 2. CT perfusion is negative according to  standard parameters, although there is a small volume of T-max > 4s parenchyma detected in the left MCA territory. 3. Repeat plain CT head appears stable and negative. This was discussed by telephone with Dr. Colon Branch on 10/19/2019 at 17:50, who advised that the patient did receive IV tPA shortly before this exam. And we discussed the possibility that the patient has a distal branch ischemia accounting for #2. Noncontrast MRI would evaluate further. Electronically Signed   By: Odessa Fleming M.D.   On: 10/19/2019 17:55   CT HEAD WO CONTRAST  Result Date: 10/20/2019 CLINICAL DATA:  Status post tPA; headache, intracranial hemorrhage suspected. Additional history provided: Intermittent right facial and right arm numbness followed by difficulty getting words out, symptoms associated with headache as well as nausea/vomiting. EXAM: CT HEAD WITHOUT CONTRAST TECHNIQUE: Contiguous axial images were obtained from the base of the skull through the vertex without intravenous contrast. COMPARISON:  Brain MRI 10/19/2019, noncontrast CT head, CT angiogram head/neck and CT perfusion 10/19/2019. FINDINGS: Brain: No evidence of acute intracranial hemorrhage. No demarcated cortical infarction. No evidence of intracranial mass. No midline shift or extra-axial fluid  collection. Cerebral volume is normal for age. Vascular: No hyperdense vessel. Skull: Normal. Negative for fracture or focal lesion. Sinuses/Orbits: Visualized orbits demonstrate no acute abnormality. No significant paranasal sinus disease or mastoid effusion at the imaged levels. IMPRESSION: No evidence of acute intracranial abnormality. Electronically Signed   By: Jackey Loge DO   On: 10/20/2019 18:52   CT ANGIO NECK W OR WO CONTRAST  Result Date: 10/19/2019 CLINICAL DATA:  19 year old female code stroke presentation with right facial droop and arm drift. Symptom onset 1330 hours. EXAM: CT ANGIOGRAPHY HEAD AND NECK CT PERFUSION BRAIN TECHNIQUE: Multidetector CT imaging of the head and neck was performed using the standard protocol during bolus administration of intravenous contrast. Multiplanar CT image reconstructions and MIPs were obtained to evaluate the vascular anatomy. Carotid stenosis measurements (when applicable) are obtained utilizing NASCET criteria, using the distal internal carotid diameter as the denominator. Multiphase CT imaging of the brain was performed following IV bolus contrast injection. Subsequent parametric perfusion maps were calculated using RAPID software. CONTRAST:  OMNIPAQUE IOHEXOL 350 MG/ML SOLN COMPARISON:  Plain head CT today 1345 hours. FINDINGS: CT Brain Perfusion Findings: ASPECTS: 10 CBF (<30%) Volume: None Perfusion (Tmax>6.0s) volume: None. Although there is a small volume of left posterior MCA region T-max > 4s volume detected. Mismatch Volume: Not applicable Infarction Location:Not applicable CTA NECK Skeleton: Negative. Upper chest: Negative; small volume residual thymus in the anterior superior mediastinum. Other neck: Within normal limits. Other findings: A repeat noncontrast head CT is also provided. Gray-white matter differentiation appears stable and within normal limits. No acute intracranial abnormality. Aortic arch: Aberrant origin of the right  subclavian artery, the thus 4 vessel arch configuration. No arch atherosclerosis. Right carotid system: Negative. Left carotid system: Negative. 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 Normal right vertebral artery origin and negative cervical right vertebral artery. Normal proximal left subclavian artery. Dominant left vertebral artery is normal from the origin to the skull base. CTA HEAD Posterior circulation: Dominant left V4 segment. No distal vertebral plaque or stenosis. Patent PICA origins and vertebrobasilar junction. Patent basilar artery. Normal SCA and right PCA origins. The left P1 segment is highly tortuous but appears otherwise within normal limits. Bilateral PCA branches are within normal limits. Anterior circulation: Both ICA siphons are patent. And no siphon plaque or stenosis is identified. There  is a mildly tortuous left posterior communicating artery on the left. Patent carotid termini. Normal MCA and ACA origins. Anterior communicating artery and bilateral ACA branches are within normal limits. Right MCA M1 segment and bifurcation are patent without stenosis. Right MCA branches are within normal limits. Left MCA M1 segment and bifurcation are patent without stenosis. Left MCA branches are within normal limits. Venous sinuses: Patent. Dominant right transverse and sigmoid sinus. Anatomic variants: Aberrant origin right subclavian artery. Dominant left vertebral artery. Fetal type left PCA origin. Review of the MIP images confirms the above findings IMPRESSION: 1. Normal CTA head and neck, negative for ELVO. 2. CT perfusion is negative according to standard parameters, although there is a small volume of T-max > 4s parenchyma detected in the left MCA territory. 3. Repeat plain CT head appears stable and negative. This was discussed by telephone with Dr. Colon Branch on 10/19/2019 at 17:50, who advised that the patient did receive IV tPA shortly before this  exam. And we discussed the possibility that the patient has a distal branch ischemia accounting for #2. Noncontrast MRI would evaluate further. Electronically Signed   By: Odessa Fleming M.D.   On: 10/19/2019 17:55   MR BRAIN WO CONTRAST  Result Date: 10/19/2019 CLINICAL DATA:  Right facial droop and arm drift EXAM: MRI HEAD WITHOUT CONTRAST TECHNIQUE: Multiplanar, multiecho pulse sequences of the brain and surrounding structures were obtained without intravenous contrast. COMPARISON:  None. FINDINGS: BRAIN: No acute infarct, acute hemorrhage or extra-axial collection. Normal white matter signal for age. Normal volume of brain parenchyma and CSF spaces. Midline structures are normal. VASCULAR: Major flow voids are preserved. Susceptibility-sensitive sequences show no chronic microhemorrhage or superficial siderosis. SKULL AND UPPER CERVICAL SPINE: Normal calvarium and skull base. Visualized upper cervical spine and soft tissues are normal. SINUSES/ORBITS: No fluid levels or advanced mucosal thickening. No mastoid or middle ear effusion. Normal orbits. IMPRESSION: Normal brain MRI. Electronically Signed   By: Deatra Robinson M.D.   On: 10/19/2019 23:38   US Venous Img Lower Bilateral (DVT)  Result Date: 10/21/2019 CLINICAL DATA:  19 year old female with stroke-like symptoms and possible patent foramen ovale. Assess for deep venous thrombosis. EXAM: BILATERAL LOWER EXTREMITY VENOUS DOPPLER ULTRASOUND TECHNIQUE: Gray-scale sonography with graded compression, as well as color Doppler and duplex ultrasound were performed to evaluate the lower extremity deep venous systems from the level of the common femoral vein and including the common femoral, femoral, profunda femoral, popliteal and calf veins including the posterior tibial, peroneal and gastrocnemius veins when visible. The superficial great saphenous vein was also interrogated. Spectral Doppler was utilized to evaluate flow at rest and with distal augmentation  maneuvers in the common femoral, femoral and popliteal veins. COMPARISON:  None. FINDINGS: RIGHT LOWER EXTREMITY Common Femoral Vein: No evidence of thrombus. Normal compressibility, respiratory phasicity and response to augmentation. Saphenofemoral Junction: No evidence of thrombus. Normal compressibility and flow on color Doppler imaging. Profunda Femoral Vein: No evidence of thrombus. Normal compressibility and flow on color Doppler imaging. Femoral Vein: No evidence of thrombus. Normal compressibility, respiratory phasicity and response to augmentation. Popliteal Vein: No evidence of thrombus. Normal compressibility, respiratory phasicity and response to augmentation. Calf Veins: No evidence of thrombus. Normal compressibility and flow on color Doppler imaging. Superficial Great Saphenous Vein: No evidence of thrombus. Normal compressibility. Venous Reflux:  None. Other Findings:  None. LEFT LOWER EXTREMITY Common Femoral Vein: No evidence of thrombus. Normal compressibility, respiratory phasicity and response to augmentation. Saphenofemoral Junction: No evidence of thrombus.  Normal compressibility and flow on color Doppler imaging. Profunda Femoral Vein: No evidence of thrombus. Normal compressibility and flow on color Doppler imaging. Femoral Vein: No evidence of thrombus. Normal compressibility, respiratory phasicity and response to augmentation. Popliteal Vein: No evidence of thrombus. Normal compressibility, respiratory phasicity and response to augmentation. Calf Veins: No evidence of thrombus. Normal compressibility and flow on color Doppler imaging. Superficial Great Saphenous Vein: No evidence of thrombus. Normal compressibility. Venous Reflux:  None. Other Findings:  None. IMPRESSION: No evidence of deep venous thrombosis in either lower extremity. Electronically Signed   By: Jacqulynn Cadet M.D.   On: 10/21/2019 07:47   CT CEREBRAL PERFUSION W CONTRAST  Result Date: 10/19/2019 CLINICAL DATA:   19 year old female code stroke presentation with right facial droop and arm drift. Symptom onset 1330 hours. EXAM: CT ANGIOGRAPHY HEAD AND NECK CT PERFUSION BRAIN TECHNIQUE: Multidetector CT imaging of the head and neck was performed using the standard protocol during bolus administration of intravenous contrast. Multiplanar CT image reconstructions and MIPs were obtained to evaluate the vascular anatomy. Carotid stenosis measurements (when applicable) are obtained utilizing NASCET criteria, using the distal internal carotid diameter as the denominator. Multiphase CT imaging of the brain was performed following IV bolus contrast injection. Subsequent parametric perfusion maps were calculated using RAPID software. CONTRAST:  118mL OMNIPAQUE IOHEXOL 350 MG/ML SOLN COMPARISON:  Plain head CT today 1345 hours. FINDINGS: CT Brain Perfusion Findings: ASPECTS: 10 CBF (<30%) Volume: None Perfusion (Tmax>6.0s) volume: None. Although there is a small volume of left posterior MCA region T-max > 4s volume detected. Mismatch Volume: Not applicable Infarction Location:Not applicable CTA NECK Skeleton: Negative. Upper chest: Negative; small volume residual thymus in the anterior superior mediastinum. Other neck: Within normal limits. Other findings: A repeat noncontrast head CT is also provided. Gray-white matter differentiation appears stable and within normal limits. No acute intracranial abnormality. Aortic arch: Aberrant origin of the right subclavian artery, the thus 4 vessel arch configuration. No arch atherosclerosis. Right carotid system: Negative. Left carotid system: Negative. 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 Normal right vertebral artery origin and negative cervical right vertebral artery. Normal proximal left subclavian artery. Dominant left vertebral artery is normal from the origin to the skull base. CTA HEAD Posterior circulation: Dominant left V4 segment. No  distal vertebral plaque or stenosis. Patent PICA origins and vertebrobasilar junction. Patent basilar artery. Normal SCA and right PCA origins. The left P1 segment is highly tortuous but appears otherwise within normal limits. Bilateral PCA branches are within normal limits. Anterior circulation: Both ICA siphons are patent. And no siphon plaque or stenosis is identified. There is a mildly tortuous left posterior communicating artery on the left. Patent carotid termini. Normal MCA and ACA origins. Anterior communicating artery and bilateral ACA branches are within normal limits. Right MCA M1 segment and bifurcation are patent without stenosis. Right MCA branches are within normal limits. Left MCA M1 segment and bifurcation are patent without stenosis. Left MCA branches are within normal limits. Venous sinuses: Patent. Dominant right transverse and sigmoid sinus. Anatomic variants: Aberrant origin right subclavian artery. Dominant left vertebral artery. Fetal type left PCA origin. Review of the MIP images confirms the above findings IMPRESSION: 1. Normal CTA head and neck, negative for ELVO. 2. CT perfusion is negative according to standard parameters, although there is a small volume of T-max > 4s parenchyma detected in the left MCA territory. 3. Repeat plain CT head appears stable and negative. This was discussed  by telephone with Dr. Colon Branch on 10/19/2019 at 17:50, who advised that the patient did receive IV tPA shortly before this exam. And we discussed the possibility that the patient has a distal branch ischemia accounting for #2. Noncontrast MRI would evaluate further. Electronically Signed   By: Odessa Fleming M.D.   On: 10/19/2019 17:55   ECHOCARDIOGRAM COMPLETE BUBBLE STUDY  Result Date: 10/20/2019    ECHOCARDIOGRAM REPORT   Patient Name:   YARELLY BURI Date of Exam: 10/20/2019 Medical Rec #:  [id number]        Height:       64.0 in Accession #:    [id number]       Weight:       114.2 lb Date of Birth:   19-Dec-2000        BSA:          1.542 m Patient Age:    18 years         BP:           88/52 mmHg Patient Gender: F                HR:           82 bpm. Exam Location:  [facility] Procedure: 2D Echo, Color Doppler, Cardiac Doppler and Saline Contrast Bubble            Study Indications:     I63.9 Stroke  History:         Patient has no prior history of Echocardiogram examinations. No                  medical history.  Sonographer:     Humphrey Rolls RDCS (AE) Referring Phys:  [id number] Lurene Shadow Diagnosing Phys: Lorine Bears MD IMPRESSIONS  1. Left ventricular ejection fraction, by estimation, is 55 to 60%. The left ventricle has normal function. The left ventricle has no regional wall motion abnormalities. Left ventricular diastolic parameters were normal.  2. Right ventricular systolic function is normal. The right ventricular size is normal. Tricuspid regurgitation signal is inadequate for assessing PA pressure.  3. The mitral valve is normal in structure and function. No evidence of mitral valve regurgitation. No evidence of mitral stenosis.  4. The aortic valve is normal in structure and function. Aortic valve regurgitation is not visualized. No aortic stenosis is present.  5. The inferior vena cava is normal in size with greater than 50% respiratory variability, suggesting right atrial pressure of 3 mmHg.  6. Agitated saline contrast bubble study was positive with shunting observed within 3-6 cardiac cycles suggestive of interatrial shunt. 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 FINDINGS  Left Ventricle: Left ventricular ejection fraction, by estimation, is 55 to 60%. The left ventricle has normal function. The left ventricle has no regional wall motion abnormalities. The left ventricular internal cavity size was normal in size. There is  no left ventricular hypertrophy. Left ventricular diastolic  parameters were normal. Right Ventricle: The right ventricular size is normal. No increase in right ventricular wall thickness. Right ventricular systolic function is normal. Tricuspid regurgitation signal is inadequate for assessing PA pressure. Left Atrium: Left atrial size was normal in size. Right Atrium: Right atrial size was normal in size. Pericardium: There is no evidence of pericardial effusion. Mitral Valve: The mitral valve is normal in structure and function. Normal mobility of the mitral valve leaflets. No evidence of mitral valve regurgitation. No evidence of mitral valve  stenosis. MV peak gradient, 5.3 mmHg. The mean mitral valve gradient is 2.0 mmHg. Tricuspid Valve: The tricuspid valve is normal in structure. Tricuspid valve regurgitation is not demonstrated. No evidence of tricuspid stenosis. Aortic Valve: The aortic valve is normal in structure and function. Aortic valve regurgitation is not visualized. No aortic stenosis is present. Aortic valve mean gradient measures 4.0 mmHg. Aortic valve peak gradient measures 7.4 mmHg. Aortic valve area, by VTI measures 1.50 cm. Pulmonic Valve: The pulmonic valve was normal in structure. Pulmonic valve regurgitation is trivial. No evidence of pulmonic stenosis. Aorta: The aortic root is normal in size and structure. Venous: The inferior vena cava is normal in size with greater than 50% respiratory variability, suggesting right atrial pressure of 3 mmHg. IAS/Shunts: The interatrial septum is aneurysmal. The interatrial septum was not well visualized. Agitated saline contrast was given intravenously to evaluate for intracardiac shunting. Agitated saline contrast bubble study was positive with shunting observed within 3-6 cardiac cycles suggestive of interatrial shunt.  LEFT VENTRICLE PLAX 2D LVIDd:         4.66 cm  Diastology LVIDs:         3.32 cm  LV e' lateral:   18.50 cm/s LV PW:         0.73 cm  LV E/e' lateral: 5.4 LV IVS:        0.48 cm  LV e' medial:     11.60 cm/s LVOT diam:     1.60 cm  LV E/e' medial:  8.6 LV SV:         44.64 ml LV SV Index:   28.95 LVOT Area:     2.01 cm  RIGHT VENTRICLE RV Basal diam:  2.31 cm LEFT ATRIUM             Index       RIGHT ATRIUM          Index LA diam:        2.60 cm 1.69 cm/m  RA Area:     7.11 cm LA Vol (A2C):   21.2 ml 13.75 ml/m RA Volume:   11.10 ml 7.20 ml/m LA Vol (A4C):   20.6 ml 13.36 ml/m LA Biplane Vol: 22.7 ml 14.72 ml/m  AORTIC VALVE                   PULMONIC VALVE AV Area (Vmax):    1.77 cm    PV Vmax:       1.09 m/s AV Area (Vmean):   1.57 cm    PV Vmean:      81.500 cm/s AV Area (VTI):     1.50 cm    PV VTI:        0.276 m AV Vmax:           136.00 cm/s PV Peak grad:  4.8 mmHg AV Vmean:          98.600 cm/s PV Mean grad:  3.0 mmHg AV VTI:            0.298 m AV Peak Grad:      7.4 mmHg AV Mean Grad:      4.0 mmHg LVOT Vmax:         120.00 cm/s LVOT Vmean:        77.100 cm/s LVOT VTI:          0.222 m LVOT/AV VTI ratio: 0.74  AORTA Ao Root diam: 2.30 cm MITRAL VALVE MV Area (PHT): 3.60 cm  SHUNTS MV Peak grad:  5.3 mmHg    Systemic VTI:  0.22 m MV Mean grad:  2.0 mmHg    Systemic Diam: 1.60 cm MV Vmax:       1.15 m/s MV Vmean:      55.0 cm/s MV Decel Time: 211 msec MV E velocity: 99.40 cm/s MV A velocity: 66.00 cm/s MV E/A ratio:  1.51 Lorine BearsMuhammad Arida MD Electronically signed by Lorine BearsMuhammad Arida MD Signature Date/Time: 10/20/2019/1:25:26 PM    Final    CT HEAD CODE STROKE WO CONTRAST`  Result Date: 10/19/2019 CLINICAL DATA:  Code stroke. [age] year old female with right facial droop, arm drift. Symptom onset 1330 hours. Images on this study became available at 3:57 pm on 10/19/2019. EXAM: CT HEAD WITHOUT CONTRAST TECHNIQUE: Contiguous axial images were obtained from the base of the skull through the vertex without intravenous contrast. COMPARISON:  Head CT 05/15/2018. FINDINGS: Brain: Cerebral volume remains normal. No midline shift, ventriculomegaly, mass effect, evidence of mass lesion, intracranial  hemorrhage or evidence of cortically based acute infarction. Gray-white matter differentiation is within normal limits throughout the brain. Vascular: No suspicious intracranial vascular hyperdensity. Skull: Stable, negative. Sinuses/Orbits: Visualized paranasal sinuses and mastoids are clear. Other: Visualized orbits and scalp soft tissues are within normal limits. ASPECTS [facility]([place] Stroke Program Early CT Score) Total score (0-10 with 10 being normal): 10 IMPRESSION: 1. Stable and Normal noncontrast CT appearance of the brain. ASPECTS 10. 2. Study discussed by telephone with Dr. Colon BranchMonks on 10/19/2019 at 16:02 . Electronically Signed   By: Odessa FlemingH  Hall M.D.   On: 10/19/2019 16:03    Assessment and Plan:   TIA and PFO: Patient's presentation initially thought to be due to to complex migraine versus aborted stroke.  Brain MRI showed no evidence of acute CVA.  Echo yesterday was notable for positive bubble study with right to left intracardiac shunt.  TEE was just performed and did not replicate right to left shunting.  The interatrial septum was noted to be somewhat mobile though no obvious PFO or ASD was seen.  Nonetheless, positive bubble study yesterday is suspicious for atrial level shunt.  I have reached out to our structural heart team to discuss further evaluation options.  It may be worthwhile to consider outpatient cardiac MRI for further evaluation of congenital anomalies including ASD.  In the meantime, I suggest antiplatelet therapy with aspirin 81 mg daily, if neurology and internal medicine are in agreement.  No further inpatient cardiac work-up is needed at this time.  For questions or updates, please contact [facility] Please consult [email or web address] for contact info under [facility].  Signed, Yvonne Kendallhristopher Coben Godshall, MD  10/21/2019 11:28 AM

## 2019-10-21 NOTE — Progress Notes (Signed)
*  PRELIMINARY RESULTS* Echocardiogram Echocardiogram Transesophageal has been performed.  Candice Villarreal 10/21/2019, 11:01 AM

## 2019-10-21 NOTE — Evaluation (Signed)
Physical Therapy Evaluation Patient Details Name: Candice Villarreal MRN: [id number] DOB: [birth year] Today's Date: 10/21/2019   History of Present Illness  Pt admitted for complaints of numbness and tingling of R face and arm. History includes migraines. Upon admission, pt given tPA. Repeat CT scan negative and brain MRI negative at this time. Current bedrest orders, however RN reported he would discontinue. Currently pending TEE this AM secondary to PFO with septal aneurysm.   Clinical Impression  Pt is a pleasant 19 year old female who was admitted for possible CVA secondary to numbness/tingling complaints. Pt demonstrates all bed mobility/transfers/ambulation at baseline level. Sensation testing and coordination testing WNL in B UE/LE. Reports minimal HA (RN aware) upon arrival and requests to keep light off. No blurred vision and able to track finger in all eye fields. Appears to be at baseline level without PT deficits at this time. Educated on exercise recommendations.  Pt does not require any further PT needs at this time. Pt will be dc in house and does not require follow up. RN aware. Will dc current orders.     Follow Up Recommendations No PT follow up    Equipment Recommendations  None recommended by PT    Recommendations for Other Services       Precautions / Restrictions Precautions Precautions: None Restrictions Weight Bearing Restrictions: No      Mobility  Bed Mobility Overal bed mobility: Independent             General bed mobility comments: safe technique with upright posture and ease of movement  Transfers Overall transfer level: Independent               General transfer comment: no safety concerns  Ambulation/Gait Ambulation/Gait assistance: Independent Gait Distance (Feet): 250 Feet Assistive device: None Gait Pattern/deviations: WFL(Within Functional Limits)     General Gait Details: safe technique with reciprocal gait pattern. Able to  turn head in B directions and look up/down without issue. Good speed.  Stairs            Wheelchair Mobility    Modified Rankin (Stroke Patients Only)       Balance Overall balance assessment: Independent                                           Pertinent Vitals/Pain Pain Assessment: 0-10 Pain Score: 2  Pain Location: headache Pain Descriptors / Indicators: Aching Pain Intervention(s): Limited activity within patient's tolerance;Premedicated before session    Home Living Family/patient expects to be discharged to:: Private residence Living Arrangements: Spouse/significant other;Parent Available Help at Discharge: Family;Available 24 hours/day Type of Home: House Home Access: Stairs to enter Entrance Stairs-Rails: Right Entrance Stairs-Number of Steps: >5 steps to enter home Home Layout: Able to live on main level with bedroom/bathroom;Two level Home Equipment: None      Prior Function Level of Independence: Independent         Comments: indep prior to admission. No falls except for roller skating. 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, newspaper        Extremity/Trunk Assessment   Upper Extremity Assessment Upper Extremity Assessment: Overall WFL for tasks assessed    Lower Extremity Assessment Lower Extremity Assessment: Overall WFL for tasks assessed       Communication   Communication: No difficulties  Cognition Arousal/Alertness: Awake/alert Behavior During Therapy: WFL for tasks assessed/performed Overall  Cognitive Status: Within Functional Limits for tasks assessed                                        General Comments      Exercises Other Exercises Other Exercises: head turns during ambulation and able to carry conversation during mobility.   Assessment/Plan    PT Assessment Patent does not need any further PT services  PT Problem List         PT Treatment Interventions      PT Goals (Current  goals can be found in the Care Plan section)  Acute Rehab PT Goals Patient Stated Goal: to go home PT Goal Formulation: All assessment and education complete, DC therapy Time For Goal Achievement: 10/21/19 Potential to Achieve Goals: Good    Frequency     Barriers to discharge        Co-evaluation               AM-PAC PT "6 Clicks" Mobility  Outcome Measure Help needed turning from your back to your side while in a flat bed without using bedrails?: None Help needed moving from lying on your back to sitting on the side of a flat bed without using bedrails?: None Help needed moving to and from a bed to a chair (including a wheelchair)?: None Help needed standing up from a chair using your arms (e.g., wheelchair or bedside chair)?: None Help needed to walk in hospital room?: None Help needed climbing 3-5 steps with a railing? : None 6 Click Score: 24    End of Session Equipment Utilized During Treatment: Gait belt Activity Tolerance: Patient tolerated treatment well Patient left: in chair Nurse Communication: Mobility status PT Visit Diagnosis: Dizziness and giddiness (R42)    Time: 2122-4825 PT Time Calculation (min) (ACUTE ONLY): 16 min   Charges:   PT Evaluation $PT Eval Low Complexity: 1 Low PT Treatments $Gait Training: 8-22 mins        Elizabeth Palau, PT, DPT [phone]   Candice Villarreal 10/21/2019, 11:07 AM

## 2019-10-21 NOTE — Progress Notes (Addendum)
Reason for consult: Transient aphasia right-sided numbness  Subjective: Patient sitting in bed, relaxed.  Awaiting for TEE.   ROS: negative except above Examination  Vital signs in last 24 hours: Temp:  [98.1 F (36.7 C)-98.5 F (36.9 C)] 98.4 F (36.9 C) (02/23 1137) Pulse Rate:  [63-110] 68 (02/23 1137) Resp:  [10-25] 10 (02/23 1115) BP: (91-149)/(46-91) 95/62 (02/23 1137) SpO2:  [95 %-100 %] 100 % (02/23 1137) Weight:  [51.8 kg] 51.8 kg (02/23 0948)  General: lying in bed CVS: pulse-normal rate and rhythm RS: breathing comfortably Extremities: normal   Neuro: MS: Alert, oriented, follows commands CN: pupils equal and reactive,  EOMI, face symmetric, tongue midline, normal sensation over face, Motor: 5/5 strength in all 4 extremities Reflexes: 2+ bilaterally over patella, biceps, plantars: flexor Coordination: normal Gait: not tested  Basic Metabolic Panel: Recent Labs  Lab 10/19/19 1601  NA 138  K 3.6  CL 104  CO2 26  GLUCOSE 103*  BUN 13  CREATININE 0.82  CALCIUM 9.0    CBC: Recent Labs  Lab 10/19/19 1601 10/20/19 0837  WBC 14.1* 13.2*  NEUTROABS 10.7* 9.3*  HGB 15.6* 13.1  HCT 45.4 38.3  MCV 84.9 84.9  PLT 294 268     Coagulation Studies: Recent Labs    10/19/19 1601  LABPROT 11.7  INR 0.9    Imaging Reviewed: CT, CT angiogram, perfusion study and MRI    ASSESSMENT AND PLAN  19 y.o. female with past medical history of migraines presents to the emergency department on 2/21 for intermittent right facial and right arm numbness followed by difficulty getting words out associated with headache and nausea.  I suspect her presentation is probably due to complex migraine given negative MRI brain and her young age without risk factors, as well as known history of migraines. However upon presentation- teleneurologist was concerned for a stroke and gave tPA.  She did have a right facial droop on his assessment.  CTP also shows possible  hypoperfusion in the left parietal lobe although this could be artifactual. It could be possible patient had a distal thrombus-that recanalized after alteplase, thereby aborting the stroke.   Echo showed PFO.  HIV is negative.  A1c is normal. LDL 86. Urine pregnancy negative. Neg for Coronavirus.  Reviewed vascular imaging-negative for FMD, aneurysm or vasoconstriction.   24-hour CT head shows no hemorrhage.  Acute onset intermittent right-sided numbness and aphasia s/p IV TPA D/D includes complex migraine vs aborted stroke  Recommendations -Follow-up TEE to assess size of PFOpresence of Atrial septal aneurysm and hypermobility which would indicate high risk PFO requiring closure -Telemetry -AIC 5.2 - LDL 98, start  Atorvastatin 40mg  daily with goal LDL less than 70 - Start ASA 81mg  daily and - LE doppler negative for DVT -Hypercoagulable work-up ordered, pending  Once TEE obtained, patient can be discharged.  Will need to have detailed discussion about risk versus benefit of PFO closure depending on findings on TEE.  We will need outpatient neurology follow-up, preferably in the stroke clinic of [facility] with Dr. or for any other vascular neurologist.   Addendum -TTE did not show any obvious atrial shunting, however TTE with bubble study was positive.  No thrombus was seen. Patient will need to continue aspirin, follow-up with cardiology as well as neurology.    Charnel Giles [facility] Pager Number Pearlean Brownie For questions after 7pm please refer to AMION to reach the Neurologist on call

## 2019-10-21 NOTE — Progress Notes (Signed)
OT Cancellation Note  Patient Details Name: Kerie Badger MRN: [id number] DOB: 2001-06-25   Cancelled Treatment:    Reason Eval/Treat Not Completed: OT screened, no needs identified, will sign off   Patient screened this date and found to be MOD I with ADLs/at baseline.  No follow up OT recommended.  Signing off at this time.  Wynn Maudlin 10/21/2019, 12:03 PM

## 2019-10-21 NOTE — Progress Notes (Signed)
SLP Cancellation Note  Patient Details Name: Candice Villarreal MRN: [id number] DOB: [birth year]   Cancelled treatment:       Reason Eval/Treat Not Completed: SLP screened, no needs identified, will sign off(chart reviewed; consulted NSG then met w/ pt). Pt denied any difficulty swallowing and is currently on a regular diet; tolerates swallowing pills w/ water per NSG. Pt conversed in conversation w/ SLP w/out deficits noted; No dysarthria or difficulty expressing herself noted. Pt and NSG denied any current speech-language deficits. Pt stated "things improved" after admission.  No further skilled ST services indicated as pt appears at her baseline. Pt agreed. NSG to reconsult if any change in status while admitted.     Candice Kenner, MS, CCC-SLP Candice Villarreal 10/21/2019, 9:36 AM

## 2019-10-21 NOTE — CV Procedure (Signed)
    Transesophageal Echocardiogram Note  Candice Villarreal [id number] [birth year]  Procedure: Transesophageal Echocardiogram Indications: Strok  Procedure Details Consent: Obtained Time Out: Verified patient identification, verified procedure, site/side was marked, verified correct patient position, special equipment/implants available, Radiology Safety Procedures followed,  medications/allergies/relevent history reviewed, required imaging and test results available.  Performed  Medications:  During this procedure the patient is administered a total of Versed 6 mg and Fentanyl 100 mcg  to achieve and maintain moderate conscious sedation.  The patient's heart rate, blood pressure, and oxygen saturation are monitored continuously during the procedure. The period of conscious sedation is 18 minutes, of which I was present face-to-face 100% of this time.  Left Ventrical:  Normal  Mitral Valve: Normal  Aortic Valve: Normal  Tricuspid Valve: Normal  Pulmonic Valve: Normal  Left Atrium/ Left atrial appendage: Normal without thrombus  Atrial septum: Redundant, no clear right-to-left shunt though opacification with agitated saline was suboptimal despite multiple attempts.  Aorta: Normal  Complications: No apparent complications Patient did tolerate procedure well.  Candice Kendall, MD 10/21/2019, 10:56 AM

## 2019-10-21 NOTE — Discharge Summary (Addendum)
Physician Discharge Summary  Darrelle Barrell EXN:[id number] DOB: 08/02/2001 DOA: 10/19/2019  PCP: Patient, No Pcp Per  Admit date: 10/19/2019 Discharge date: 10/21/2019  Admitted From: Home Disposition:  Home  Recommendations for Outpatient Follow-up:  Follow up with PCP in 1-2 weeks Please obtain BMP/CBC in one week Please follow up with neurology in 1-2 weeks:  [facility], Dr. Pearlean Brownie, vascular neurologist Please follow up results of hypercoagulable labs, pending on discharge Follow up report of TEE regarding PFO and possible closure, if indicated    Home Health: No  Equipment/Devices: None   Discharge Condition: Stable  CODE STATUS: Full  Diet recommendation: Regular  Brief/Interim Summary:  Candice Villarreal is an 19 y.o. female with medical history significant for migraine, acute appendicitis status post laparoscopic appendectomy, presented to the hospital because of right facial numbness and right arm numbness, and a little slurred, associated with a headache.  According to ED physician, Dr. Colon Branch, patient had significant expressive aphasia when she first presented to the ED.  She does not report any weakness in her extremities although chart review shows that patient had initially reported some weakness in the right arm.  No other complaints.   Teleneurology evaluated patient in ED, concerned for stroke and gave tPA.  MRI brain was negative.  Transthoracic echo showed a PFO, so TEE was obtained prior to discharge and will be discussed with patient in follow up.  Patients symptoms resolved and did not recur during admission.  It is suspected that patient's neurologic symptoms were due to complex migraine, however, given PFO, unable to rule out stroke.  Patient placed on aspirin and statin.  Neurology's Assessment & Recommendations: complex migraine vs aborted stroke "-Follow-up TEE to assess size of PFOpresence of Atrial septal aneurysm and hypermobility which would  indicate high risk PFO requiring closure -Telemetry -AIC 5.2 - LDL 98, start  Atorvastatin 40mg  daily with goal LDL less than 70 - Start ASA 81mg  daily and  - LE doppler negative for DVT -Hypercoagulable work-up ordered, pending Once TEE obtained, patient can be discharged.  Will need to have detailed discussion about risk versus benefit of PFO closure depending on findings on TEE.    Discharge Diagnoses: Principal Problem:   Numbness and tingling of right face Active Problems:   Right arm weakness   Expressive aphasia   Tobacco use disorder   PFO (patent foramen ovale)   Numbness and tingling of right side of face   TIA (transient ischemic attack)    Discharge Instructions   Discharge Instructions     Call MD for:   Complete by: As directed    New or recurrent neurologic symptoms - weakness in one part or one side of the body, numbness/tingling, trouble speaking or understanding speech, trouble swallowing, loss of balance or vision changes.   Call MD for:  persistant dizziness or light-headedness   Complete by: As directed    Call MD for:  temperature >100.4   Complete by: As directed    Diet - low sodium heart healthy   Complete by: As directed    Discharge instructions   Complete by: As directed    Start taking atorvastatin (aka Lipitor), for cholesterol.  Goal is LDL cholesterol < 70.  Follow up with [facility].   Increase activity slowly   Complete by: As directed    Schedule appointment   Complete by: As directed    Follow up with a PCP for routine health maintenance and to assist with management  of migraine      Allergies as of 10/21/2019   No Known Allergies      Medication List     TAKE these medications     stroke: mapping our early stages of recovery book Misc 1 each by Does not apply route once for 1 dose.   atorvastatin 40 MG tablet Commonly known as: Lipitor Take 1 tablet (40 mg total) by mouth daily.         No  Known Allergies  Consultations: Neurology Cardiology for TEE    Procedures/Studies: CT ANGIO HEAD W OR WO CONTRAST  Result Date: 10/19/2019 CLINICAL DATA:  19 year old female code stroke presentation with right facial droop and arm drift. Symptom onset 1330 hours. EXAM: CT ANGIOGRAPHY HEAD AND NECK CT PERFUSION BRAIN TECHNIQUE: Multidetector CT imaging of the head and neck was performed using the standard protocol during bolus administration of intravenous contrast. Multiplanar CT image reconstructions and MIPs were obtained to evaluate the vascular anatomy. Carotid stenosis measurements (when applicable) are obtained utilizing NASCET criteria, using the distal internal carotid diameter as the denominator. Multiphase CT imaging of the brain was performed following IV bolus contrast injection. Subsequent parametric perfusion maps were calculated using RAPID software. CONTRAST:  100mL OMNIPAQUE IOHEXOL 350 MG/ML SOLN COMPARISON:  Plain head CT today 1345 hours. FINDINGS: CT Brain Perfusion Findings: ASPECTS: 10 CBF (<30%) Volume: None Perfusion (Tmax>6.0s) volume: None. Although there is a small volume of left posterior MCA region T-max > 4s volume detected. Mismatch Volume: Not applicable Infarction Location:Not applicable CTA NECK Skeleton: Negative. Upper chest: Negative; small volume residual thymus in the anterior superior mediastinum. Other neck: Within normal limits. Other findings: A repeat noncontrast head CT is also provided. Gray-white matter differentiation appears stable and within normal limits. No acute intracranial abnormality. Aortic arch: Aberrant origin of the right subclavian artery, the thus 4 vessel arch configuration. No arch atherosclerosis. Right carotid system: Negative. Left carotid system: Negative. 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 Normal right vertebral artery origin and negative cervical right vertebral artery. Normal  proximal left subclavian artery. Dominant left vertebral artery is normal from the origin to the skull base. CTA HEAD Posterior circulation: Dominant left V4 segment. No distal vertebral plaque or stenosis. Patent PICA origins and vertebrobasilar junction. Patent basilar artery. Normal SCA and right PCA origins. The left P1 segment is highly tortuous but appears otherwise within normal limits. Bilateral PCA branches are within normal limits. Anterior circulation: Both ICA siphons are patent. And no siphon plaque or stenosis is identified. There is a mildly tortuous left posterior communicating artery on the left. Patent carotid termini. Normal MCA and ACA origins. Anterior communicating artery and bilateral ACA branches are within normal limits. Right MCA M1 segment and bifurcation are patent without stenosis. Right MCA branches are within normal limits. Left MCA M1 segment and bifurcation are patent without stenosis. Left MCA branches are within normal limits. Venous sinuses: Patent. Dominant right transverse and sigmoid sinus. Anatomic variants: Aberrant origin right subclavian artery. Dominant left vertebral artery. Fetal type left PCA origin. Review of the MIP images confirms the above findings IMPRESSION: 1. Normal CTA head and neck, negative for ELVO. 2. CT perfusion is negative according to standard parameters, although there is a small volume of T-max > 4s parenchyma detected in the left MCA territory. 3. Repeat plain CT head appears stable and negative. This was discussed by telephone with Dr. Colon BranchMonks on 10/19/2019 at 17:50, who advised that the patient  did receive IV tPA shortly before this exam. And we discussed the possibility that the patient has a distal branch ischemia accounting for #2. Noncontrast MRI would evaluate further. Electronically Signed   By: Odessa Fleming M.D.   On: 10/19/2019 17:55   CT HEAD WO CONTRAST  Result Date: 10/20/2019 CLINICAL DATA:  Status post tPA; headache, intracranial  hemorrhage suspected. Additional history provided: Intermittent right facial and right arm numbness followed by difficulty getting words out, symptoms associated with headache as well as nausea/vomiting. EXAM: CT HEAD WITHOUT CONTRAST TECHNIQUE: Contiguous axial images were obtained from the base of the skull through the vertex without intravenous contrast. COMPARISON:  Brain MRI 10/19/2019, noncontrast CT head, CT angiogram head/neck and CT perfusion 10/19/2019. FINDINGS: Brain: No evidence of acute intracranial hemorrhage. No demarcated cortical infarction. No evidence of intracranial mass. No midline shift or extra-axial fluid collection. Cerebral volume is normal for age. Vascular: No hyperdense vessel. Skull: Normal. Negative for fracture or focal lesion. Sinuses/Orbits: Visualized orbits demonstrate no acute abnormality. No significant paranasal sinus disease or mastoid effusion at the imaged levels. IMPRESSION: No evidence of acute intracranial abnormality. Electronically Signed   By: Jackey Loge DO   On: 10/20/2019 18:52   CT ANGIO NECK W OR WO CONTRAST  Result Date: 10/19/2019 CLINICAL DATA:  19 year old female code stroke presentation with right facial droop and arm drift. Symptom onset 1330 hours. EXAM: CT ANGIOGRAPHY HEAD AND NECK CT PERFUSION BRAIN TECHNIQUE: Multidetector CT imaging of the head and neck was performed using the standard protocol during bolus administration of intravenous contrast. Multiplanar CT image reconstructions and MIPs were obtained to evaluate the vascular anatomy. Carotid stenosis measurements (when applicable) are obtained utilizing NASCET criteria, using the distal internal carotid diameter as the denominator. Multiphase CT imaging of the brain was performed following IV bolus contrast injection. Subsequent parametric perfusion maps were calculated using RAPID software. CONTRAST:  OMNIPAQUE IOHEXOL 350 MG/ML SOLN COMPARISON:  Plain head CT today 1345 hours.  FINDINGS: CT Brain Perfusion Findings: ASPECTS: 10 CBF (<30%) Volume: None Perfusion (Tmax>6.0s) volume: None. Although there is a small volume of left posterior MCA region T-max > 4s volume detected. Mismatch Volume: Not applicable Infarction Location:Not applicable CTA NECK Skeleton: Negative. Upper chest: Negative; small volume residual thymus in the anterior superior mediastinum. Other neck: Within normal limits. Other findings: A repeat noncontrast head CT is also provided. Gray-white matter differentiation appears stable and within normal limits. No acute intracranial abnormality. Aortic arch: Aberrant origin of the right subclavian artery, the thus 4 vessel arch configuration. No arch atherosclerosis. Right carotid system: Negative. Left carotid system: Negative. 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 Normal right vertebral artery origin and negative cervical right vertebral artery. Normal proximal left subclavian artery. Dominant left vertebral artery is normal from the origin to the skull base. CTA HEAD Posterior circulation: Dominant left V4 segment. No distal vertebral plaque or stenosis. Patent PICA origins and vertebrobasilar junction. Patent basilar artery. Normal SCA and right PCA origins. The left P1 segment is highly tortuous but appears otherwise within normal limits. Bilateral PCA branches are within normal limits. Anterior circulation: Both ICA siphons are patent. And no siphon plaque or stenosis is identified. There is a mildly tortuous left posterior communicating artery on the left. Patent carotid termini. Normal MCA and ACA origins. Anterior communicating artery and bilateral ACA branches are within normal limits. Right MCA M1 segment and bifurcation are patent without stenosis. Right MCA branches are within  normal limits. Left MCA M1 segment and bifurcation are patent without stenosis. Left MCA branches are within normal limits. Venous  sinuses: Patent. Dominant right transverse and sigmoid sinus. Anatomic variants: Aberrant origin right subclavian artery. Dominant left vertebral artery. Fetal type left PCA origin. Review of the MIP images confirms the above findings IMPRESSION: 1. Normal CTA head and neck, negative for ELVO. 2. CT perfusion is negative according to standard parameters, although there is a small volume of T-max > 4s parenchyma detected in the left MCA territory. 3. Repeat plain CT head appears stable and negative. This was discussed by telephone with Dr. Colon Branch on 10/19/2019 at 17:50, who advised that the patient did receive IV tPA shortly before this exam. And we discussed the possibility that the patient has a distal branch ischemia accounting for #2. Noncontrast MRI would evaluate further. Electronically Signed   By: Odessa Fleming M.D.   On: 10/19/2019 17:55   MR BRAIN WO CONTRAST  Result Date: 10/19/2019 CLINICAL DATA:  Right facial droop and arm drift EXAM: MRI HEAD WITHOUT CONTRAST TECHNIQUE: Multiplanar, multiecho pulse sequences of the brain and surrounding structures were obtained without intravenous contrast. COMPARISON:  None. FINDINGS: BRAIN: No acute infarct, acute hemorrhage or extra-axial collection. Normal white matter signal for age. Normal volume of brain parenchyma and CSF spaces. Midline structures are normal. VASCULAR: Major flow voids are preserved. Susceptibility-sensitive sequences show no chronic microhemorrhage or superficial siderosis. SKULL AND UPPER CERVICAL SPINE: Normal calvarium and skull base. Visualized upper cervical spine and soft tissues are normal. SINUSES/ORBITS: No fluid levels or advanced mucosal thickening. No mastoid or middle ear effusion. Normal orbits. IMPRESSION: Normal brain MRI. Electronically Signed   By: Deatra Robinson M.D.   On: 10/19/2019 23:38   US Venous Img Lower Bilateral (DVT)  Result Date: 10/21/2019 CLINICAL DATA:  19 year old female with stroke-like symptoms and possible  patent foramen ovale. Assess for deep venous thrombosis. EXAM: BILATERAL LOWER EXTREMITY VENOUS DOPPLER ULTRASOUND TECHNIQUE: Gray-scale sonography with graded compression, as well as color Doppler and duplex ultrasound were performed to evaluate the lower extremity deep venous systems from the level of the common femoral vein and including the common femoral, femoral, profunda femoral, popliteal and calf veins including the posterior tibial, peroneal and gastrocnemius veins when visible. The superficial great saphenous vein was also interrogated. Spectral Doppler was utilized to evaluate flow at rest and with distal augmentation maneuvers in the common femoral, femoral and popliteal veins. COMPARISON:  None. FINDINGS: RIGHT LOWER EXTREMITY Common Femoral Vein: No evidence of thrombus. Normal compressibility, respiratory phasicity and response to augmentation. Saphenofemoral Junction: No evidence of thrombus. Normal compressibility and flow on color Doppler imaging. Profunda Femoral Vein: No evidence of thrombus. Normal compressibility and flow on color Doppler imaging. Femoral Vein: No evidence of thrombus. Normal compressibility, respiratory phasicity and response to augmentation. Popliteal Vein: No evidence of thrombus. Normal compressibility, respiratory phasicity and response to augmentation. Calf Veins: No evidence of thrombus. Normal compressibility and flow on color Doppler imaging. Superficial Great Saphenous Vein: No evidence of thrombus. Normal compressibility. Venous Reflux:  None. Other Findings:  None. LEFT LOWER EXTREMITY Common Femoral Vein: No evidence of thrombus. Normal compressibility, respiratory phasicity and response to augmentation. Saphenofemoral Junction: No evidence of thrombus. Normal compressibility and flow on color Doppler imaging. Profunda Femoral Vein: No evidence of thrombus. Normal compressibility and flow on color Doppler imaging. Femoral Vein: No evidence of thrombus. Normal  compressibility, respiratory phasicity and response to augmentation. Popliteal Vein: No evidence of thrombus. Normal  compressibility, respiratory phasicity and response to augmentation. Calf Veins: No evidence of thrombus. Normal compressibility and flow on color Doppler imaging. Superficial Great Saphenous Vein: No evidence of thrombus. Normal compressibility. Venous Reflux:  None. Other Findings:  None. IMPRESSION: No evidence of deep venous thrombosis in either lower extremity. Electronically Signed   By: Malachy Moan M.D.   On: 10/21/2019 07:47   CT CEREBRAL PERFUSION W CONTRAST  Result Date: 10/19/2019 CLINICAL DATA:  19 year old female code stroke presentation with right facial droop and arm drift. Symptom onset 1330 hours. EXAM: CT ANGIOGRAPHY HEAD AND NECK CT PERFUSION BRAIN TECHNIQUE: Multidetector CT imaging of the head and neck was performed using the standard protocol during bolus administration of intravenous contrast. Multiplanar CT image reconstructions and MIPs were obtained to evaluate the vascular anatomy. Carotid stenosis measurements (when applicable) are obtained utilizing NASCET criteria, using the distal internal carotid diameter as the denominator. Multiphase CT imaging of the brain was performed following IV bolus contrast injection. Subsequent parametric perfusion maps were calculated using RAPID software. CONTRAST:  OMNIPAQUE IOHEXOL 350 MG/ML SOLN COMPARISON:  Plain head CT today 1345 hours. FINDINGS: CT Brain Perfusion Findings: ASPECTS: 10 CBF (<30%) Volume: None Perfusion (Tmax>6.0s) volume: None. Although there is a small volume of left posterior MCA region T-max > 4s volume detected. Mismatch Volume: Not applicable Infarction Location:Not applicable CTA NECK Skeleton: Negative. Upper chest: Negative; small volume residual thymus in the anterior superior mediastinum. Other neck: Within normal limits. Other findings: A repeat noncontrast head CT is also provided.  Gray-white matter differentiation appears stable and within normal limits. No acute intracranial abnormality. Aortic arch: Aberrant origin of the right subclavian artery, the thus 4 vessel arch configuration. No arch atherosclerosis. Right carotid system: Negative. Left carotid system: Negative. 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 Normal right vertebral artery origin and negative cervical right vertebral artery. Normal proximal left subclavian artery. Dominant left vertebral artery is normal from the origin to the skull base. CTA HEAD Posterior circulation: Dominant left V4 segment. No distal vertebral plaque or stenosis. Patent PICA origins and vertebrobasilar junction. Patent basilar artery. Normal SCA and right PCA origins. The left P1 segment is highly tortuous but appears otherwise within normal limits. Bilateral PCA branches are within normal limits. Anterior circulation: Both ICA siphons are patent. And no siphon plaque or stenosis is identified. There is a mildly tortuous left posterior communicating artery on the left. Patent carotid termini. Normal MCA and ACA origins. Anterior communicating artery and bilateral ACA branches are within normal limits. Right MCA M1 segment and bifurcation are patent without stenosis. Right MCA branches are within normal limits. Left MCA M1 segment and bifurcation are patent without stenosis. Left MCA branches are within normal limits. Venous sinuses: Patent. Dominant right transverse and sigmoid sinus. Anatomic variants: Aberrant origin right subclavian artery. Dominant left vertebral artery. Fetal type left PCA origin. Review of the MIP images confirms the above findings IMPRESSION: 1. Normal CTA head and neck, negative for ELVO. 2. CT perfusion is negative according to standard parameters, although there is a small volume of T-max > 4s parenchyma detected in the left MCA territory. 3. Repeat plain CT head appears stable  and negative. This was discussed by telephone with Dr. Colon Branch on 10/19/2019 at 17:50, who advised that the patient did receive IV tPA shortly before this exam. And we discussed the possibility that the patient has a distal branch ischemia accounting for #2. Noncontrast MRI would evaluate further. Electronically  Signed   By: Odessa Fleming M.D.   On: 10/19/2019 17:55   ECHO TEE  Result Date: 10/21/2019    TRANSESOPHOGEAL ECHO REPORT   Patient Name:   Candice Villarreal Date of Exam: 10/21/2019 Medical Rec #:  [id number]        Height:       64.0 in Accession #:    [id number]       Weight:       114.2 lb Date of Birth:  2000/12/17        BSA:          1.542 m Patient Age:    18 years         BP:           98/57 mmHg Patient Gender: F                HR:           68 bpm. Exam Location:  [facility] Procedure: Cardiac Doppler, Color Doppler and Saline Contrast Bubble Study Indications:     Stroke 434.91  History:         Patient has prior history of Echocardiogram examinations, most                  recent 10/20/2019. No cardiac history in chart.  Sonographer:     Cristela Blue RDCS (AE) Referring Phys:  [id number] Lennon Alstrom Diagnosing Phys: [name] MD PROCEDURE: After discussion of the risks and benefits of a TEE, an informed consent was obtained from the patient. The transesophogeal probe was passed without difficulty through the esophogus of the patient. Local oropharyngeal anesthetic was provided with viscous lidocaine. Sedation performed by performing physician. The patient's vital signs; including heart rate, blood pressure, and oxygen saturation; remained stable throughout the procedure. The patient developed no complications during the procedure. The patient was agitated throughout the procedure despite sedation (fentanyl 100 mcg and midazolam 6 mg), which limited the number of views that could be obtained. IMPRESSIONS  1. Left ventricular ejection fraction, by estimation, is 60 to 65%. The left ventricle has normal  function. The left ventricle has no regional wall motion abnormalities. Left ventricular diastolic function could not be evaluated.  2. Right ventricular systolic function is normal. The right ventricular size is normal.  3. No left atrial/left atrial appendage thrombus was detected.  4. The mitral valve is normal in structure and function. No evidence of mitral valve regurgitation.  5. The aortic valve is tricuspid. Aortic valve regurgitation is not visualized.  6. Bubble study was negative, though Duplex images suggest possible PFO.  7. The patient was agitated throughout the procedure despite sedation (fentanyl 100 mcg and midazolam 6 mg), which limited the number of views that could be obtained. FINDINGS  Left Ventricle: Left ventricular ejection fraction, by estimation, is 60 to 65%. The left ventricle has normal function. The left ventricle has no regional wall motion abnormalities. The left ventricular internal cavity size was normal in size. There is  no left ventricular hypertrophy. Right Ventricle: The right ventricular size is normal. Right vetricular wall thickness was not assessed. Right ventricular systolic function is normal. Left Atrium: Left atrial size was not well visualized. No left atrial/left atrial appendage thrombus was detected. Right Atrium: Right atrial size was not well visualized. Pericardium: There is no evidence of pericardial effusion. Mitral Valve: The mitral valve is normal in structure and function. No evidence of mitral valve regurgitation. Tricuspid Valve: The tricuspid valve is  normal in structure. Tricuspid valve regurgitation is trivial. Aortic Valve: The aortic valve is tricuspid. Aortic valve regurgitation is not visualized. Pulmonic Valve: The pulmonic valve was grossly normal. Pulmonic valve regurgitation is not visualized. Aorta: The aortic root and ascending aorta are structurally normal, with no evidence of dilitation. IAS/Shunts: There is redundancy of the interatrial  septum. No atrial level shunt detected by color flow Doppler. Agitated saline contrast was given intravenously to evaluate for intracardiac shunting. Bubble study was negative, though Duplex images suggest possible PFO. Nelva Bush MD Electronically signed by Nelva Bush MD Signature Date/Time: 10/21/2019/12:42:45 PM    Final    ECHOCARDIOGRAM COMPLETE BUBBLE STUDY  Result Date: 10/20/2019    ECHOCARDIOGRAM REPORT   Patient Name:   Candice Villarreal Date of Exam: 10/20/2019 Medical Rec #:  [id number]        Height:       64.0 in Accession #:    [id number]       Weight:       114.2 lb Date of Birth:  [birth year]        BSA:          1.542 m Patient Age:    18 years         BP:           88/52 mmHg Patient Gender: F                HR:           82 bpm. Exam Location:  [facility] Procedure: 2D Echo, Color Doppler, Cardiac Doppler and Saline Contrast Bubble            Study Indications:     I63.9 Stroke  History:         Patient has no prior history of Echocardiogram examinations. No                  medical history.  Sonographer:     Charmayne Sheer RDCS (AE) Referring Phys:  [place] Diagnosing Phys: Kathlyn Sacramento MD IMPRESSIONS  1. Left ventricular ejection fraction, by estimation, is 55 to 60%. The left ventricle has normal function. The left ventricle has no regional wall motion abnormalities. Left ventricular diastolic parameters were normal.  2. Right ventricular systolic function is normal. The right ventricular size is normal. Tricuspid regurgitation signal is inadequate for assessing PA pressure.  3. The mitral valve is normal in structure and function. No evidence of mitral valve regurgitation. No evidence of mitral stenosis.  4. The aortic valve is normal in structure and function. Aortic valve regurgitation is not visualized. No aortic stenosis is present.  5. The inferior vena cava is normal in size with greater than 50% respiratory variability, suggesting right atrial pressure of 3 mmHg.  6.  Agitated saline contrast bubble study was positive with shunting observed within 3-6 cardiac cycles suggestive of interatrial shunt. 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 FINDINGS  Left Ventricle: Left ventricular ejection fraction, by estimation, is 55 to 60%. The left ventricle has normal function. The left ventricle has no regional wall motion abnormalities. The left ventricular internal cavity size was normal in size. There is  no left ventricular hypertrophy. Left ventricular diastolic parameters were normal. Right Ventricle: The right ventricular size is normal. No increase in right ventricular wall thickness. Right ventricular systolic function is normal. Tricuspid regurgitation signal is inadequate for assessing PA pressure. Left Atrium: Left atrial size was  normal in size. Right Atrium: Right atrial size was normal in size. Pericardium: There is no evidence of pericardial effusion. Mitral Valve: The mitral valve is normal in structure and function. Normal mobility of the mitral valve leaflets. No evidence of mitral valve regurgitation. No evidence of mitral valve stenosis. MV peak gradient, 5.3 mmHg. The mean mitral valve gradient is 2.0 mmHg. Tricuspid Valve: The tricuspid valve is normal in structure. Tricuspid valve regurgitation is not demonstrated. No evidence of tricuspid stenosis. Aortic Valve: The aortic valve is normal in structure and function. Aortic valve regurgitation is not visualized. No aortic stenosis is present. Aortic valve mean gradient measures 4.0 mmHg. Aortic valve peak gradient measures 7.4 mmHg. Aortic valve area, by VTI measures 1.50 cm. Pulmonic Valve: The pulmonic valve was normal in structure. Pulmonic valve regurgitation is trivial. No evidence of pulmonic stenosis. Aorta: The aortic root is normal in size and structure. Venous: The inferior vena cava is normal  in size with greater than 50% respiratory variability, suggesting right atrial pressure of 3 mmHg. IAS/Shunts: The interatrial septum is aneurysmal. The interatrial septum was not well visualized. Agitated saline contrast was given intravenously to evaluate for intracardiac shunting. Agitated saline contrast bubble study was positive with shunting observed within 3-6 cardiac cycles suggestive of interatrial shunt.  LEFT VENTRICLE PLAX 2D LVIDd:         4.66 cm  Diastology LVIDs:         3.32 cm  LV e' lateral:   18.50 cm/s LV PW:         0.73 cm  LV E/e' lateral: 5.4 LV IVS:        0.48 cm  LV e' medial:    11.60 cm/s LVOT diam:     1.60 cm  LV E/e' medial:  8.6 LV SV:         44.64 ml LV SV Index:   28.95 LVOT Area:     2.01 cm  RIGHT VENTRICLE RV Basal diam:  2.31 cm LEFT ATRIUM             Index       RIGHT ATRIUM          Index LA diam:        2.60 cm 1.69 cm/m  RA Area:     7.11 cm LA Vol (A2C):   21.2 ml 13.75 ml/m RA Volume:   11.10 ml 7.20 ml/m LA Vol (A4C):   20.6 ml 13.36 ml/m LA Biplane Vol: 22.7 ml 14.72 ml/m  AORTIC VALVE                   PULMONIC VALVE AV Area (Vmax):    1.77 cm    PV Vmax:       1.09 m/s AV Area (Vmean):   1.57 cm    PV Vmean:      81.500 cm/s AV Area (VTI):     1.50 cm    PV VTI:        0.276 m AV Vmax:           136.00 cm/s PV Peak grad:  4.8 mmHg AV Vmean:          98.600 cm/s PV Mean grad:  3.0 mmHg AV VTI:            0.298 m AV Peak Grad:      7.4 mmHg AV Mean Grad:      4.0 mmHg LVOT Vmax:  120.00 cm/s LVOT Vmean:        77.100 cm/s LVOT VTI:          0.222 m LVOT/AV VTI ratio: 0.74  AORTA Ao Root diam: 2.30 cm MITRAL VALVE MV Area (PHT): 3.60 cm    SHUNTS MV Peak grad:  5.3 mmHg    Systemic VTI:  0.22 m MV Mean grad:  2.0 mmHg    Systemic Diam: 1.60 cm MV Vmax:       1.15 m/s MV Vmean:      55.0 cm/s MV Decel Time: 211 msec MV E velocity: 99.40 cm/s MV A velocity: 66.00 cm/s MV E/A ratio:  1.51 Lorine Bears MD Electronically signed by Lorine Bears MD  Signature Date/Time: 10/20/2019/1:25:26 PM    Final    CT HEAD CODE STROKE WO CONTRAST`  Result Date: 10/19/2019 CLINICAL DATA:  Code stroke. 19 year old female with right facial droop, arm drift. Symptom onset 1330 hours. Images on this study became available at 3:57 pm on 10/19/2019. EXAM: CT HEAD WITHOUT CONTRAST TECHNIQUE: Contiguous axial images were obtained from the base of the skull through the vertex without intravenous contrast. COMPARISON:  Head CT 05/15/2018. FINDINGS: Brain: Cerebral volume remains normal. No midline shift, ventriculomegaly, mass effect, evidence of mass lesion, intracranial hemorrhage or evidence of cortically based acute infarction. Gray-white matter differentiation is within normal limits throughout the brain. Vascular: No suspicious intracranial vascular hyperdensity. Skull: Stable, negative. Sinuses/Orbits: Visualized paranasal sinuses and mastoids are clear. Other: Visualized orbits and scalp soft tissues are within normal limits. ASPECTS [facility] Stroke Program Early CT Score) Total score (0-10 with 10 being normal): 10 IMPRESSION: 1. Stable and Normal noncontrast CT appearance of the brain. ASPECTS 10. 2. Study discussed by telephone with Dr. Colon Branch on 10/19/2019 at 16:02 . Electronically Signed   By: Odessa Fleming M.D.   On: 10/19/2019 16:03    Transthoracic Echo with bubble study 2/22 -  EF 55-60%, positive bubble study  Transesophageal Echo 2/23 - report pending  Subjective: Patient seen this AM, states ready to go home.  Denies any complaints of weakness, trobule speaking, headache or numbness.  No acute events reported.   Discharge Exam: Vitals:   10/21/19 1115 10/21/19 1137  BP: (!) 91/53 95/62  Pulse: 64 68  Resp: 10   Temp:  98.4 F (36.9 C)  SpO2: 99% 100%   Vitals:   10/21/19 1045 10/21/19 1100 10/21/19 1115 10/21/19 1137  BP: (!) 105/59 (!) 98/57 (!) 91/53 95/62  Pulse: 77 68 64 68  Resp: 16 11 10    Temp:    98.4 F (36.9 C)  TempSrc:    Oral   SpO2: 98% 100% 99% 100%  Weight:      Height:        General: Pt is alert, awake, not in acute distress Cardiovascular: RRR, S1/S2 +, no rubs, no gallops Respiratory: CTA bilaterally, no wheezing, no rhonchi Abdominal: Soft, NT, ND, bowel sounds + Extremities: no edema, no cyanosis    The results of significant diagnostics from this hospitalization (including imaging, microbiology, ancillary and laboratory) are listed below for reference.     Microbiology: Recent Results (from the past 240 hour(s))  Respiratory Panel by RT PCR (Flu A&B, Covid) - Nasopharyngeal Swab     Status: None   Collection Time: 10/19/19  5:46 PM   Specimen: Nasopharyngeal Swab  Result Value Ref Range Status   SARS Coronavirus 2 by RT PCR NEGATIVE NEGATIVE Final    Comment: (NOTE) SARS-CoV-2 target  nucleic acids are NOT DETECTED. The SARS-CoV-2 RNA is generally detectable in upper respiratoy specimens during the acute phase of infection. The lowest concentration of SARS-CoV-2 viral copies this assay can detect is 131 copies/mL. A negative result does not preclude SARS-Cov-2 infection and should not be used as the sole basis for treatment or other patient management decisions. A negative result may occur with  improper specimen collection/handling, submission of specimen other than nasopharyngeal swab, presence of viral mutation(s) within the areas targeted by this assay, and inadequate number of viral copies (<131 copies/mL). A negative result must be combined with clinical observations, patient history, and epidemiological information. The expected result is Negative. Fact Sheet for Patients:  https://www.moore.com/ Fact Sheet for Healthcare Providers:  https://www.young.biz/ This test is not yet ap proved or cleared by the [place] FDA and  has been authorized for detection and/or diagnosis of SARS-CoV-2 by FDA under an Emergency Use Authorization (EUA).  This EUA will remain  in effect (meaning this test can be used) for the duration of the COVID-19 declaration under Section 564(b)(1) of the Act, 21 U.S.C. section 360bbb-3(b)(1), unless the authorization is terminated or revoked sooner.    Influenza A by PCR NEGATIVE NEGATIVE Final   Influenza B by PCR NEGATIVE NEGATIVE Final    Comment: (NOTE) The Xpert Xpress SARS-CoV-2/FLU/RSV assay is intended as an aid in  the diagnosis of influenza from Nasopharyngeal swab specimens and  should not be used as a sole basis for treatment. Nasal washings and  aspirates are unacceptable for Xpert Xpress SARS-CoV-2/FLU/RSV  testing. Fact Sheet for Patients: https://www.moore.com/ Fact Sheet for Healthcare Providers: https://www.young.biz/ This test is not yet approved or cleared by the [place] FDA and  has been authorized for detection and/or diagnosis of SARS-CoV-2 by  FDA under an Emergency Use Authorization (EUA). This EUA will remain  in effect (meaning this test can be used) for the duration of the  Covid-19 declaration under Section 564(b)(1) of the Act, 21  U.S.C. section 360bbb-3(b)(1), unless the authorization is  terminated or revoked. Performed at [facility], [address] [place], [place] [address]      Labs: BNP (last 3 results) No results for input(s): BNP in the last 8760 hours. Basic Metabolic Panel: Recent Labs  Lab 10/19/19 1601  NA 138  K 3.6  CL 104  CO2 26  GLUCOSE 103*  BUN 13  CREATININE 0.82  CALCIUM 9.0   Liver Function Tests: Recent Labs  Lab 10/19/19 1601  AST 17  ALT 12  ALKPHOS 48  BILITOT 1.0  PROT 7.2  ALBUMIN 4.3   No results for input(s): LIPASE, AMYLASE in the last 168 hours. No results for input(s): AMMONIA in the last 168 hours. CBC: Recent Labs  Lab 10/19/19 1601 10/20/19 0837  WBC 14.1* 13.2*  NEUTROABS 10.7* 9.3*  HGB 15.6* 13.1  HCT 45.4 38.3  MCV 84.9 84.9  PLT 294  268   Cardiac Enzymes: No results for input(s): CKTOTAL, CKMB, CKMBINDEX, TROPONINI in the last 168 hours. BNP: Invalid input(s): POCBNP CBG: Recent Labs  Lab 10/19/19 1631 10/19/19 1906  GLUCAP 77 94   D-Dimer No results for input(s): DDIMER in the last 72 hours. Hgb A1c Recent Labs    10/20/19 0429  HGBA1C 5.2   Lipid Profile Recent Labs    10/20/19 0429  CHOL 140  HDL 41  LDLCALC 86  TRIG 64  CHOLHDL 3.4   Thyroid function studies No results for input(s): TSH, T4TOTAL, T3FREE,  THYROIDAB in the last 72 hours.  Invalid input(s): FREET3 Anemia work up No results for input(s): VITAMINB12, FOLATE, FERRITIN, TIBC, IRON, RETICCTPCT in the last 72 hours. Urinalysis    Component Value Date/Time   COLORURINE YELLOW (A) 10/19/2019 1601   APPEARANCEUR CLEAR (A) 10/19/2019 1601   LABSPEC 1.012 10/19/2019 1601   PHURINE 8.0 10/19/2019 1601   GLUCOSEU NEGATIVE 10/19/2019 1601   HGBUR NEGATIVE 10/19/2019 1601   BILIRUBINUR NEGATIVE 10/19/2019 1601   KETONESUR NEGATIVE 10/19/2019 1601   PROTEINUR NEGATIVE 10/19/2019 1601   NITRITE NEGATIVE 10/19/2019 1601   LEUKOCYTESUR SMALL (A) 10/19/2019 1601   Sepsis Labs Invalid input(s): PROCALCITONIN,  WBC,  LACTICIDVEN Microbiology Recent Results (from the past 240 hour(s))  Respiratory Panel by RT PCR (Flu A&B, Covid) - Nasopharyngeal Swab     Status: None   Collection Time: 10/19/19  5:46 PM   Specimen: Nasopharyngeal Swab  Result Value Ref Range Status   SARS Coronavirus 2 by RT PCR NEGATIVE NEGATIVE Final    Comment: (NOTE) SARS-CoV-2 target nucleic acids are NOT DETECTED. The SARS-CoV-2 RNA is generally detectable in upper respiratoy specimens during the acute phase of infection. The lowest concentration of SARS-CoV-2 viral copies this assay can detect is 131 copies/mL. A negative result does not preclude SARS-Cov-2 infection and should not be used as the sole basis for treatment or other patient management  decisions. A negative result may occur with  improper specimen collection/handling, submission of specimen other than nasopharyngeal swab, presence of viral mutation(s) within the areas targeted by this assay, and inadequate number of viral copies (<131 copies/mL). A negative result must be combined with clinical observations, patient history, and epidemiological information. The expected result is Negative. Fact Sheet for Patients:  https://www.moore.com/ Fact Sheet for Healthcare Providers:  https://www.young.biz/ This test is not yet ap proved or cleared by the [place] FDA and  has been authorized for detection and/or diagnosis of SARS-CoV-2 by FDA under an Emergency Use Authorization (EUA). This EUA will remain  in effect (meaning this test can be used) for the duration of the COVID-19 declaration under Section 564(b)(1) of the Act, 21 U.S.C. section 360bbb-3(b)(1), unless the authorization is terminated or revoked sooner.    Influenza A by PCR NEGATIVE NEGATIVE Final   Influenza B by PCR NEGATIVE NEGATIVE Final    Comment: (NOTE) The Xpert Xpress SARS-CoV-2/FLU/RSV assay is intended as an aid in  the diagnosis of influenza from Nasopharyngeal swab specimens and  should not be used as a sole basis for treatment. Nasal washings and  aspirates are unacceptable for Xpert Xpress SARS-CoV-2/FLU/RSV  testing. Fact Sheet for Patients: https://www.moore.com/ Fact Sheet for Healthcare Providers: https://www.young.biz/ This test is not yet approved or cleared by the [place] FDA and  has been authorized for detection and/or diagnosis of SARS-CoV-2 by  FDA under an Emergency Use Authorization (EUA). This EUA will remain  in effect (meaning this test can be used) for the duration of the  Covid-19 declaration under Section 564(b)(1) of the Act, 21  U.S.C. section 360bbb-3(b)(1), unless the authorization  is  terminated or revoked. Performed at [facility], [address] [place], [place] [address]      Time coordinating discharge: Over 30 minutes  SIGNED:   Pennie Banter, DO [facility] 10/21/2019, 12:51 PM   If 7PM-7AM, please contact night-coverage [email or web address]

## 2019-10-21 NOTE — Clinical Social Work Note (Signed)
Patient has orders to discharge home today. Only new med is Atorvastatin 40 mg PO daily which was sent to [facility] on [address]. Cost will be $15 per [facility]'s low-cost medication list. Patient said she should be able to afford it. Her husband will pick her up today. No further concerns. CSW signing off.  Charlynn Court, CSW [phone]

## 2019-10-21 NOTE — Interval H&P Note (Signed)
History and Physical Interval Note:  10/21/2019 10:17 AM  Candice Villarreal  has presented today for surgery, with the diagnosis of stroke and intracardiac shunt.  The various methods of treatment have been discussed with the patient and family. After consideration of risks, benefits and other options for treatment, the patient has consented to  Procedure(s): TRANSESOPHAGEAL ECHOCARDIOGRAM (TEE) (N/A) as a surgical intervention.  The patient's history has been reviewed, patient examined, no change in status, stable for surgery.  I have reviewed the patient's chart and labs.  Questions were answered to the patient's satisfaction.     [name]

## 2019-10-22 LAB — PROTEIN C, TOTAL: Protein C, Total: 105 % (ref 60–150)

## 2019-10-22 LAB — BETA-2-GLYCOPROTEIN I ABS, IGG/M/A
Beta-2 Glyco I IgG: <9 GPI IgG units (ref 0–20)
Beta-2-Glycoprotein I IgA: <9 GPI IgA units (ref 0–25)
Beta-2-Glycoprotein I IgM: 9 GPI IgM units (ref 0–32)

## 2019-10-22 LAB — CARDIOLIPIN ANTIBODIES, IGG, IGM, IGA
Anticardiolipin IgA: <9 APL U/mL (ref 0–11)
Anticardiolipin IgG: <9 GPL U/mL (ref 0–14)
Anticardiolipin IgM: <9 MPL U/mL (ref 0–12)

## 2019-10-22 LAB — LUPUS ANTICOAGULANT PANEL
DRVVT: 26 s (ref 0.0–47.0)
PTT Lupus Anticoagulant: 33.6 s (ref 0.0–51.9)

## 2019-10-22 LAB — PROTEIN C ACTIVITY: Protein C Activity: 129 % (ref 73–180)

## 2019-10-22 LAB — HOMOCYSTEINE: Homocysteine: 14.5 umol/L (ref 0.0–14.5)

## 2019-10-22 LAB — PROTEIN S, TOTAL: Protein S Ag, Total: 73 % (ref 60–150)

## 2019-10-22 LAB — PROTEIN S ACTIVITY: Protein S Activity: 116 % (ref 63–140)

## 2019-10-24 LAB — FACTOR 5 LEIDEN

## 2019-10-24 LAB — PROTHROMBIN GENE MUTATION

## 2019-11-02 NOTE — Progress Notes (Deleted)
Office Visit    Patient Name: Candice Villarreal Date of Encounter: 11/02/2019  Primary Care Provider:  Patient, No Pcp Per Primary Cardiologist:  [name], MD  Chief Complaint    No chief complaint on file. 19 yo female with history of migraines and appendicitis, and who is being seen today after her most recent admission 09/2019 for stroke.   Past Medical History    Past Medical History:  Diagnosis Date  . Acute appendicitis 01/28/2019   Past Surgical History:  Procedure Laterality Date  . LAPAROSCOPIC APPENDECTOMY N/A 01/28/2019   Procedure: APPENDECTOMY LAPAROSCOPIC;  Surgeon: Ancil Linsey, MD;  Location: [facility] ORS;  Service: General;  Laterality: N/A;  . TEE WITHOUT CARDIOVERSION N/A 10/21/2019   Procedure: TRANSESOPHAGEAL ECHOCARDIOGRAM (TEE);  Surgeon: [name], MD;  Location: [facility] ORS;  Service: Cardiovascular;  Laterality: N/A;    Allergies  No Known Allergies  History of Present Illness    Candice Villarreal is a 19 y.o. female with PMH as above. She presented to [facility] ED with acute onset of R sided numbness and weakness, as well as speech difficult. While the sx eventually dissapated, she ultimately presented to the ED with the assistance of a friend. Upon arrival, she was noted to have word finding difficulty and received TPA. CT head and MRI brain did not show evidence of infarct. It was thought her presentation could represent complex migraine over aborted stroke. Echo bubble study was performed 10/20/19 and notable for R-L shunt. TEE was performed 10/21/19 and did not replicate L-R shunting. The interatrial septum was noted to be somewhat mobile though no obvious PFO or ASD was seen. However, it was noted that the positive bubble study warranted concern for atrial level shunt. As a result, the hospital cardiologist reached out to the [facility] structural heart team for further evaluation options. It was noted that outpatient cardiac MRI might be worthwhile to  evaluate for congenital anomalies, including ASD. In the meantime, DAPT was recommended. She was only discharged on a statin, however.   Home Medications    Prior to Admission medications   Medication Sig Start Date End Date Taking? Authorizing Provider  atorvastatin (LIPITOR) 40 MG tablet Take 1 tablet (40 mg total) by mouth daily. 10/21/19 12/20/19  Pennie Banter, DO    Review of Systems    ***.   All other systems reviewed and are otherwise negative except as noted above.  Physical Exam    VS:  There were no vitals taken for this visit. , BMI There is no height or weight on file to calculate BMI. GEN: Well nourished, well developed, in no acute distress. HEENT: normal. Neck: Supple, no JVD, carotid bruits, or masses. Cardiac: RRR, no murmurs, rubs, or gallops. No clubbing, cyanosis, edema.  Radials/DP/PT 2+ and equal bilaterally.  Respiratory:  Respirations regular and unlabored, clear to auscultation bilaterally. GI: Soft, nontender, nondistended, BS + x 4. MS: no deformity or atrophy. Skin: warm and dry, no rash. Neuro:  Strength and sensation are intact. Psych: Normal affect.  Accessory Clinical Findings    ECG personally reviewed by me today - *** - no acute changes.  VITALS Reviewed today   Temp Readings from Last 3 Encounters:  10/21/19 98.4 F (36.9 C) (Oral)  04/13/19 99.3 F (37.4 C) (Oral)  02/13/19 98.1 F (36.7 C) (Temporal)   BP Readings from Last 3 Encounters:  10/21/19 98/62  04/13/19 (!) 95/53  02/13/19 106/72   Pulse Readings from Last 3 Encounters:  10/21/19 68  04/13/19 73  02/13/19 72    Wt Readings from Last 3 Encounters:  10/21/19 114 lb 3.2 oz (51.8 kg) (23 %, Z= -0.74)*  04/13/19 115 lb (52.2 kg) (26 %, Z= -0.64)*  02/13/19 117 lb (53.1 kg) (31 %, Z= -0.49)*   * Growth percentiles are based on CDC (Girls, 2-20 Years) data.     LABS  reviewed today   CareEverwhere Labs present? Yes/No: No  Lab Results  Component Value Date    WBC 13.2 (H) 10/20/2019   HGB 13.1 10/20/2019   HCT 38.3 10/20/2019   MCV 84.9 10/20/2019   PLT 268 10/20/2019   Lab Results  Component Value Date   CREATININE 0.82 10/19/2019   BUN 13 10/19/2019   NA 138 10/19/2019   K 3.6 10/19/2019   CL 104 10/19/2019   CO2 26 10/19/2019   Lab Results  Component Value Date   ALT 12 10/19/2019   AST 17 10/19/2019   ALKPHOS 48 10/19/2019   BILITOT 1.0 10/19/2019   Lab Results  Component Value Date   CHOL 140 10/20/2019   HDL 41 10/20/2019   LDLCALC 86 10/20/2019   TRIG 64 10/20/2019   CHOLHDL 3.4 10/20/2019    Lab Results  Component Value Date   HGBA1C 5.2 10/20/2019   No results found for: TSH   STUDIES/PROCEDURES reviewed today    TEE 10/21/19 1. Left ventricular ejection fraction, by estimation, is 60 to 65%. The  left ventricle has normal function. The left ventricle has no regional  wall motion abnormalities. Left ventricular diastolic function could not  be evaluated.  2. Right ventricular systolic function is normal. The right ventricular  size is normal.  3. No left atrial/left atrial appendage thrombus was detected.  4. The mitral valve is normal in structure and function. No evidence of  mitral valve regurgitation.  5. The aortic valve is tricuspid. Aortic valve regurgitation is not  visualized.  6. Bubble study was negative, though Duplex images suggest possible PFO.  7. The patient was agitated throughout the procedure despite sedation  (fentanyl 100 mcg and midazolam 6 mg), which limited the number of views  that could be obtained.  FINDINGS  Left Ventricle: Left ventricular ejection fraction, by estimation, is 60  to 65%. The left ventricle has normal function. The left ventricle has no  regional wall motion abnormalities. The left ventricular internal cavity  size was normal in size. There is  no left ventricular hypertrophy.  Right Ventricle: The right ventricular size is normal. Right vetricular    wall thickness was not assessed. Right ventricular systolic function is  normal.  Left Atrium: Left atrial size was not well visualized. No left atrial/left  atrial appendage thrombus was detected.  Right Atrium: Right atrial size was not well visualized.  Pericardium: There is no evidence of pericardial effusion.  Mitral Valve: The mitral valve is normal in structure and function. No  evidence of mitral valve regurgitation.  Tricuspid Valve: The tricuspid valve is normal in structure. Tricuspid  valve regurgitation is trivial.  Aortic Valve: The aortic valve is tricuspid. Aortic valve regurgitation is  not visualized.  Pulmonic Valve: The pulmonic valve was grossly normal. Pulmonic valve  regurgitation is not visualized.  Aorta: The aortic root and ascending aorta are structurally normal, with  no evidence of dilitation.  IAS/Shunts: There is redundancy of the interatrial septum. No atrial level  shunt detected by color flow Doppler. Agitated saline contrast was given  intravenously  to evaluate for intracardiac shunting. Bubble study was  negative, though Duplex images  suggest possible PFO.   Echo 10/20/19 1. Left ventricular ejection fraction, by estimation, is 55 to 60%. The  left ventricle has normal function. The left ventricle has no regional  wall motion abnormalities. Left ventricular diastolic parameters were  normal.  2. Right ventricular systolic function is normal. The right ventricular  size is normal. Tricuspid regurgitation signal is inadequate for assessing  PA pressure.  3. The mitral valve is normal in structure and function. No evidence of  mitral valve regurgitation. No evidence of mitral stenosis.  4. The aortic valve is normal in structure and function. Aortic valve  regurgitation is not visualized. No aortic stenosis is present.  5. The inferior vena cava is normal in size with greater than 50%  respiratory variability, suggesting right atrial  pressure of 3 mmHg.  6. Agitated saline contrast bubble study was positive with shunting  observed within 3-6 cardiac cycles suggestive of interatrial shunt.  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   Assessment & Plan    ***  Medication changes: *** Labs ordered: *** Studies / Imaging ordered: *** Future considerations: *** Disposition: ***  Total time spent with patient today *** minutes. This includes reviewing records, evaluating the patient, and coordinating care. Face-to-face time >50%.    Arvil Chaco, PA-C 11/02/2019

## 2019-11-04 ENCOUNTER — Ambulatory Visit: Payer: Self-pay | Admitting: Physician Assistant

## 2019-11-05 ENCOUNTER — Encounter: Payer: Self-pay | Admitting: Physician Assistant

## 2019-11-06 ENCOUNTER — Telehealth: Payer: Self-pay | Admitting: Internal Medicine

## 2019-11-06 NOTE — Telephone Encounter (Signed)
Attempted to schedule.  LMOV to call office.  ° °

## 2019-11-06 NOTE — Telephone Encounter (Signed)
-----   Message from Lennon Alstrom, PA-C sent at 11/06/2019  2:39 PM EST ----- Good afternoon!  I reached out to Dr. Okey Dupre to let him know this patient was a no show, and he asked that we call her and make sure to get her in, as she really needs to get in about her PFO. Are we able to call and reschedule her?  Thanks!

## 2019-12-01 NOTE — Telephone Encounter (Signed)
Attempted to schedule.  LMOV to call office.  ° °

## 2019-12-24 NOTE — Telephone Encounter (Signed)
3 attempts to schedule fu   Closing encounter.

## 2019-12-24 NOTE — Telephone Encounter (Signed)
Attempted to schedule.  LMOV to call office.  ° °

## 2019-12-25 ENCOUNTER — Other Ambulatory Visit: Payer: Self-pay

## 2019-12-25 ENCOUNTER — Encounter: Payer: Self-pay | Admitting: Family

## 2019-12-25 ENCOUNTER — Ambulatory Visit (INDEPENDENT_AMBULATORY_CARE_PROVIDER_SITE_OTHER): Payer: Medicaid Other | Admitting: Family

## 2019-12-25 VITALS — BP 100/60 | HR 63 | Ht 60.0 in | Wt 118.4 lb

## 2019-12-25 DIAGNOSIS — R42 Dizziness and giddiness: Secondary | ICD-10-CM | POA: Diagnosis not present

## 2019-12-25 DIAGNOSIS — Q211 Atrial septal defect: Secondary | ICD-10-CM | POA: Diagnosis not present

## 2019-12-25 DIAGNOSIS — Q2112 Patent foramen ovale: Secondary | ICD-10-CM

## 2019-12-25 NOTE — Patient Instructions (Addendum)
Medication Instructions:  No medication changes.   *If you need a refill on your cardiac medications before your next appointment, please call your pharmacy*   Lab Work: No lab work today.   Testing/Procedures: Your EKG today shows normal sinus rhythm which is a good result!   Follow-Up: At [facility], you and your health needs are our priority.  As part of our continuing mission to provide you with exceptional heart care, we have created designated Provider Care Teams.  These Care Teams include your primary Cardiologist (physician) and Advanced Practice Providers (APPs -  Physician Assistants and Nurse Practitioners) who all work together to provide you with the care you need, when you need it.  We recommend signing up for the patient portal called "MyChart".  Sign up information is provided on this After Visit Summary.  MyChart is used to connect with patients for Virtual Visits (Telemedicine).  Patients are able to view lab/test results, encounter notes, upcoming appointments, etc.  Non-urgent messages can be sent to your provider as well.   To learn more about what you can do with MyChart, go to [email or web address].    Your next appointment:   A referral has been placed for you to see Dr. Excell Seltzer  Other Instructions  Recommend following up with Dr. Pearlean Brownie of Neurology. His phone number is [phone].   Dr. Excell Seltzer will discuss with you whether the PFO (patent foramen ovale) will need to be closed or simply monitored.

## 2019-12-25 NOTE — Progress Notes (Signed)
Office Visit    Patient Name: Candice Villarreal Date of Encounter: 12/25/2019  Primary Care Provider:  [facility] Primary Cardiologist:  [name], MD Electrophysiologist:  None   Chief Complaint    Candice Villarreal is a 19 y.o. female with a hx of migraine, PFO, and ?CVA presents today for follow-up after TEE  Past Medical History    Past Medical History:  Diagnosis Date  . Acute appendicitis 01/28/2019   Past Surgical History:  Procedure Laterality Date  . LAPAROSCOPIC APPENDECTOMY N/A 01/28/2019   Procedure: APPENDECTOMY LAPAROSCOPIC;  Surgeon: Ancil Linsey, MD;  Location: [facility] ORS;  Service: General;  Laterality: N/A;  . TEE WITHOUT CARDIOVERSION N/A 10/21/2019   Procedure: TRANSESOPHAGEAL ECHOCARDIOGRAM (TEE);  Surgeon: [name], MD;  Location: [facility] ORS;  Service: Cardiovascular;  Laterality: N/A;    Allergies  No Known Allergies  History of Present Illness    Candice Villarreal is a 19 y.o. female with a hx of migraine, PFO last seen while hospitalized.  She presented to [facility] with right-sided facial numbness and right arm numbness associated headache.  Per ED physician she had significant expressive aphasia when presented to the ED. MRI of brain was unrevealing.  Her presentation was concerning for CVA as well as complex migraine.  Upon presentation telemetry neurologist was concerned for stroke and she did receive TPA.  Per neurology's documentation is possible she had a distal thrombus that recanalized after ateplase, aborting the stroke. Echocardiogram bubble study 10/20/19 with LVEF 55 to 60%, no RWMA, RV normal size and function, no significant valvular abnormalities, positive bubble study with shunting. Subsequent TEE with LVEF 60-65%, no thrombus noted, bubble study negative, though duplex images suggest possible PFO. Noted that she was agitated during the study leading to limited views.   She has not yet followed up with neurology  post discharge. Reports no recurrent numbness, weakness, or speech changes. No residual weakness or speech changes noted.  Tells me when she lays down her heart gets "cold", but no chest pain, pressure, tightness. Reports no shortness of breath or dyspnea on exertion.  No palpitations, near-syncope, syncope.  She will intermittently get lightheaded. Notices it particularly when she changes positions too quickly. Works long shifts at [employer] on her feet with lots of movement. Does try to stay well hydrated. Tells me she drinks sweet tea and water throughout the day, about 3-4 bottles of water.  We discussed making position changes slowly.  We discussed orthostatic hypotension.  We reviewed her bubble study and TEE done while in the hospital.  Discussed concern for PFO which could be contributory to her presenting symptoms.  Discussed possibility of PFO repair and need to see Dr. Excell Seltzer within [facility].   She asks whether she needs to remain on atorvastatin and aspirin.  Told her that decision would have to come from neurology and encouraged to follow-up with our office.   EKGs/Labs/Other Studies Reviewed:   The following studies were reviewed today:  Echo Complete Bubble 10/20/19  1. Left ventricular ejection fraction, by estimation, is 55 to 60%. The  left ventricle has normal function. The left ventricle has no regional  wall motion abnormalities. Left ventricular diastolic parameters were  normal.   2. Right ventricular systolic function is normal. The right ventricular  size is normal. Tricuspid regurgitation signal is inadequate for assessing  PA pressure.   3. The mitral valve is normal in structure and function. No evidence of  mitral valve regurgitation. No  evidence of mitral stenosis.   4. The aortic valve is normal in structure and function. Aortic valve  regurgitation is not visualized. No aortic stenosis is present.   5. The inferior vena cava is normal in size with greater  than 50%  respiratory variability, suggesting right atrial pressure of 3 mmHg.   6. Agitated saline contrast bubble study was positive with shunting  observed within 3-6 cardiac cycles suggestive of interatrial shunt.   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    Echo TEE 10/21/19 1. Left ventricular ejection fraction, by estimation, is 60 to 65%. The  left ventricle has normal function. The left ventricle has no regional  wall motion abnormalities. Left ventricular diastolic function could not  be evaluated.   2. Right ventricular systolic function is normal. The right ventricular  size is normal.   3. No left atrial/left atrial appendage thrombus was detected.   4. The mitral valve is normal in structure and function. No evidence of  mitral valve regurgitation.   5. The aortic valve is tricuspid. Aortic valve regurgitation is not  visualized.   6. Bubble study was negative, though Duplex images suggest possible PFO.   7. The patient was agitated throughout the procedure despite sedation  (fentanyl 100 mcg and midazolam 6 mg), which limited the number of views  that could be obtained.   EKG:  EKG is ordered today.  The ekg ordered today demonstrates SR 63 bpm with sinus arrhythmia  Recent Labs: 10/19/2019: ALT 12; BUN 13; Creatinine, Ser 0.82; Potassium 3.6; Sodium 138 10/20/2019: Hemoglobin 13.1; Platelets 268  Recent Lipid Panel    Component Value Date/Time   CHOL 140 10/20/2019 0429   TRIG 64 10/20/2019 0429   HDL 41 10/20/2019 0429   CHOLHDL 3.4 10/20/2019 0429   VLDL 13 10/20/2019 0429   LDLCALC 86 10/20/2019 0429    Home Medications   Current Meds  Medication Sig  . acetaminophen (TYLENOL) 325 MG tablet Take 650 mg by mouth every 6 (six) hours as needed.  . ASPIRIN 81 PO Take by mouth daily.  Marland Kitchen atorvastatin (LIPITOR) 40 MG tablet Take 1 tablet (40  mg total) by mouth daily.  . Multiple Vitamins-Minerals (WOMENS MULTIVITAMIN) TABS Take by mouth daily.    Review of Systems    Review of Systems  Constitution: Negative for chills, fever and malaise/fatigue.  Cardiovascular: Negative for chest pain, dyspnea on exertion, leg swelling, near-syncope, orthopnea, palpitations and syncope.  Respiratory: Negative for cough, shortness of breath and wheezing.   Gastrointestinal: Negative for nausea and vomiting.  Neurological: Positive for light-headedness. Negative for dizziness and weakness.   All other systems reviewed and are otherwise negative except as noted above.  Physical Exam    VS:  BP 100/60 (BP Location: Left Arm, Patient Position: Sitting, Cuff Size: Normal)   Pulse 63   Ht 5' (1.524 m)   Wt 118 lb 6 oz (53.7 kg)   SpO2 98%   BMI 23.12 kg/m  , BMI Body mass index is 23.12 kg/m. GEN: Well nourished, well developed, in no acute distress. HEENT: normal. Neck: Supple, no JVD, carotid bruits, or masses. Cardiac: RRR, no murmurs, rubs, or gallops. No clubbing, cyanosis, edema.  Radials/DP/PT 2+ and equal bilaterally.  Respiratory:  Respirations regular and unlabored, clear to auscultation bilaterally. GI: Soft, nontender, nondistended, BS + x 4. MS: No deformity or atrophy. Skin: Warm and dry, no rash. Neuro:  Strength  and sensation are intact. Psych: Normal affect.  Assessment & Plan    1. ?PFO - Recent admission for weakness and speech changes with unclear diagnosis of stroke versus complex migraine. Echo bubble study while admitted with normal LVEF, no significant valvular abnormalities but positive bubble study. Subsequent TEE difficulty obtaining images due to movement with negative bubble study, but duplex images suggestive of PFO. We discussed possible need for closure and need to see Dr. Excell Seltzer within [facility], she was agreeable.   2. CVA vs complex migraine - Recent admission presenting with left sided weakness and  speech changes. Received TPA. MRI negative. Encouraged to follow up with neurology (Dr. Pearlean Brownie). She was provided their contact information. She is presently on aspirin and Atorvastatin at their direction, encouraged her to follow up with them to determine if these are chronic medications. Of note, if she is planning to become pregnant will need to discontinue.   3. Lightheadedness - Occurs with position changes. Likely orthostatic hypotension. No near-syncope nor syncope. Recommend careful position changes, staying well hydrated. As she was on telemetry monitoring while hospitalized with no acute finding would defer ambulatory telemetry monitoring as she has no palpitations and symptoms are very consistent with orthostatic hypotension. If symptoms change or worsen, ZIO monitor could be considered.   Disposition: Follow up with Dr. Excell Seltzer for consideration of PFO closure.    Alver Sorrow, NP 12/25/2019, 7:56 AM

## 2020-01-01 NOTE — Addendum Note (Signed)
Addended by: Sherri Rad C on: 01/01/2020 04:23 PM   Modules accepted: Orders

## 2020-01-02 NOTE — Progress Notes (Signed)
I reviewed her echo, neuro consult, and TEE. Both echo studies are suspicious but not confirmatory for PFO. 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 I'm happy to see her to discuss options. I think it would be helpful to do a TCD-bubble prior to seeing her. If this suggests moderate/large PFO I'd be inclined to recommend closure. If small shunt by TCD, along with the uncertainty on TEE, I'd favor medical therapy. I'm not sure if TCD studies are done in [place], but I could refer the patient to [facility] for this.   Dr Laurence Slate does that sound like a reasonable plan?

## 2020-01-09 ENCOUNTER — Telehealth: Payer: Self-pay

## 2020-01-09 DIAGNOSIS — Q2112 Patent foramen ovale: Secondary | ICD-10-CM

## 2020-01-09 NOTE — Telephone Encounter (Signed)
Patient returning call.

## 2020-01-09 NOTE — Telephone Encounter (Signed)
-----   Message from Tonny Bollman, MD sent at 01/02/2020  8:37 AM EDT -----   ----- Message ----- From: Alver Sorrow, NP Sent: 12/25/2019  10:11 PM EDT To: Tonny Bollman, MD  I saw Candice Villarreal (19 yo F) for follow up after admission with ?CVA vs complex migraine. Echo bubble study was positive. TEE (images difficult due to patient agitation) with negative bubble study, but images suggestive of PFO. Dr. Excell Seltzer - will you please look at her images? I anticipate you are the next person she needs to see and she was agreeable. [facility], let me know if I need to order anything or place a formal consult!). Unfortunately she hasn't seen neurology since discharge, but I've provided her their contact info and asked her to follow up. Dr. Okey Dupre, including you for FYI since I know she missed her initial follow up with you.

## 2020-01-09 NOTE — Telephone Encounter (Signed)
Scheduled patient for TCD bubble study 5/19 and PFO consult with Dr. Excell Seltzer 5/26.  Neurology notified.

## 2020-01-09 NOTE — Telephone Encounter (Addendum)
Per Dr. Excell Seltzer, will arrange TCD bubble study for patient prior to scheduling for PFO consult.   Left message for patient to call back to discuss.

## 2020-01-14 ENCOUNTER — Ambulatory Visit (HOSPITAL_COMMUNITY)
Admission: RE | Admit: 2020-01-14 | Discharge: 2020-01-14 | Disposition: A | Discharge status: Home / Self Care | Payer: Medicaid Other | Source: Ambulatory Visit | Attending: Cardiovascular Disease | Admitting: Cardiovascular Disease

## 2020-01-14 ENCOUNTER — Other Ambulatory Visit: Payer: Self-pay

## 2020-01-14 DIAGNOSIS — R931 Abnormal findings on diagnostic imaging of heart and coronary circulation: Secondary | ICD-10-CM | POA: Diagnosis not present

## 2020-01-14 DIAGNOSIS — Q211 Atrial septal defect: Secondary | ICD-10-CM | POA: Diagnosis not present

## 2020-01-14 DIAGNOSIS — Q2112 Patent foramen ovale: Secondary | ICD-10-CM

## 2020-01-14 NOTE — Progress Notes (Signed)
TCD with bubble injection has been completed. Preliminary results can be found in CV Proc through chart review.   01/14/20 2:48 PM Olen Cordial RVT

## 2020-01-21 ENCOUNTER — Encounter: Payer: Self-pay | Admitting: Cardiovascular Disease

## 2020-01-21 ENCOUNTER — Ambulatory Visit (INDEPENDENT_AMBULATORY_CARE_PROVIDER_SITE_OTHER): Payer: Medicaid Other | Admitting: Cardiovascular Disease

## 2020-01-21 ENCOUNTER — Other Ambulatory Visit: Payer: Self-pay

## 2020-01-21 VITALS — BP 90/60 | HR 98 | Ht 65.0 in | Wt 114.0 lb

## 2020-01-21 DIAGNOSIS — Q211 Atrial septal defect: Secondary | ICD-10-CM | POA: Diagnosis not present

## 2020-01-21 DIAGNOSIS — Q2112 Patent foramen ovale: Secondary | ICD-10-CM

## 2020-01-21 NOTE — Progress Notes (Signed)
Cardiology Office Note:    Date:  01/23/2020   ID:  Candice Villarreal, DOB 2001-05-03, MRN [id number]  PCP:  [facility]  Cardiologist:  [name], MD  Electrophysiologist:  None   Referring MD: [facility]*   Chief Complaint  Patient presents with  . PFO    History of Present Illness:    Candice Villarreal is a 19 y.o. female with a hx of cryptogenic stroke, presenting for evaluation of PFO.  The patient presented to [facility] in February 2021 with symptoms of expressive aphasia, facial numbness, and right arm numbness and clumsiness.  She was treated with TPA with resolution of her symptoms.  MRI of the brain was negative.  An echo bubble study was positive for right to left shunt.  Otherwise there is no cardiac abnormality identified on her echocardiogram.  TEE demonstrated normal LV function with some images suggestive of a PFO but no clear findings of a positive bubble study.  The patient is here alone today to discuss consideration of transcatheter PFO closure.  She is very stressed and emotional because she thinks she might be pregnant. She asks about taking a pregnancy test. She is 2 weeks late on her menstrual period and has been having nausea every morning for the last few weeks.   She has a hx of migraine headaches since age 37.  She is otherwise healthy.  Past Medical History:  Diagnosis Date  . Acute appendicitis 01/28/2019    Past Surgical History:  Procedure Laterality Date  . LAPAROSCOPIC APPENDECTOMY N/A 01/28/2019   Procedure: APPENDECTOMY LAPAROSCOPIC;  Surgeon: Ancil Linsey, MD;  Location: [facility] ORS;  Service: General;  Laterality: N/A;  . TEE WITHOUT CARDIOVERSION N/A 10/21/2019   Procedure: TRANSESOPHAGEAL ECHOCARDIOGRAM (TEE);  Surgeon: [name], MD;  Location: [facility] ORS;  Service: Cardiovascular;  Laterality: N/A;    Current Medications: Current Meds  Medication Sig  . acetaminophen (TYLENOL)  325 MG tablet Take 650 mg by mouth every 6 (six) hours as needed.  . ASPIRIN 81 PO Take by mouth daily.  . Multiple Vitamins-Minerals (WOMENS MULTIVITAMIN) TABS Take by mouth daily.     Allergies:   Patient has no known allergies.   Social History   Socioeconomic History  . Marital status: Single    Spouse name: Not on file  . Number of children: Not on file  . Years of education: Not on file  . Highest education level: Not on file  Occupational History  . Not on file  Tobacco Use  . Smoking status: Former Smoker    Quit date: 11/24/2019    Years since quitting: 0.1  . Smokeless tobacco: Never Used  Substance and Sexual Activity  . Alcohol use: Not Currently  . Drug use: Not Currently    Types: Marijuana  . Sexual activity: Not on file  Other Topics Concern  . Not on file  Social History Narrative  . Not on file   Social Determinants of Health   Financial Resource Strain: Low Risk   . Difficulty of Paying Living Expenses: Not hard at all  Food Insecurity: No Food Insecurity  . Worried About Programme researcher, broadcasting/film/video in the Last Year: Never true  . Ran Out of Food in the Last Year: Never true  Transportation Needs: No Transportation Needs  . Lack of Transportation (Medical): No  . Lack of Transportation (Non-Medical): No  Physical Activity: Sufficiently Active  . Days of Exercise per Week: 5 days  .  Minutes of Exercise per Session: 60 min  Stress: Stress Concern Present  . Feeling of Stress : To some extent  Social Connections: Unknown  . Frequency of Communication with Friends and Family: More than three times a week  . Frequency of Social Gatherings with Friends and Family: More than three times a week  . Attends Religious Services: Patient refused  . Active Member of Clubs or Organizations: Patient refused  . Attends Archivist Meetings: Patient refused  . Marital Status: Living with partner     Family History: The patient's family history includes  Hyperlipidemia in her mother; Hypertension in her mother.  ROS:   Please see the history of present illness.    All other systems reviewed and are negative.  EKGs/Labs/Other Studies Reviewed:    The following studies were reviewed today: Echo 10-20-2019: IMPRESSIONS    1. Left ventricular ejection fraction, by estimation, is 55 to 60%. The  left ventricle has normal function. The left ventricle has no regional  wall motion abnormalities. Left ventricular diastolic parameters were  normal.  2. Right ventricular systolic function is normal. The right ventricular  size is normal. Tricuspid regurgitation signal is inadequate for assessing  PA pressure.  3. The mitral valve is normal in structure and function. No evidence of  mitral valve regurgitation. No evidence of mitral stenosis.  4. The aortic valve is normal in structure and function. Aortic valve  regurgitation is not visualized. No aortic stenosis is present.  5. The inferior vena cava is normal in size with greater than 50%  respiratory variability, suggesting right atrial pressure of 3 mmHg.  6. Agitated saline contrast bubble study was positive with shunting  observed within 3-6 cardiac cycles suggestive of interatrial shunt.   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   Transesophageal echo: FINDINGS  Left Ventricle: Left ventricular ejection fraction, by estimation, is 60  to 65%. The left ventricle has normal function. The left ventricle has no  regional wall motion abnormalities. The left ventricular internal cavity  size was normal in size. There is  no left ventricular hypertrophy.   Right Ventricle: The right ventricular size is normal. Right vetricular  wall thickness was not assessed. Right ventricular systolic function is  normal.   Left Atrium: Left atrial size was not well  visualized. No left atrial/left  atrial appendage thrombus was detected.   Right Atrium: Right atrial size was not well visualized.   Pericardium: There is no evidence of pericardial effusion.   Mitral Valve: The mitral valve is normal in structure and function. No  evidence of mitral valve regurgitation.   Tricuspid Valve: The tricuspid valve is normal in structure. Tricuspid  valve regurgitation is trivial.   Aortic Valve: The aortic valve is tricuspid. Aortic valve regurgitation is  not visualized.   Pulmonic Valve: The pulmonic valve was grossly normal. Pulmonic valve  regurgitation is not visualized.   Aorta: The aortic root and ascending aorta are structurally normal, with  no evidence of dilitation.   IAS/Shunts: There is redundancy of the interatrial septum. No atrial level  shunt detected by color flow Doppler. Agitated saline contrast was given  intravenously to evaluate for intracardiac shunting. Bubble study was  negative, though Duplex images  suggest possible PFO.   Transcranial Doppler study 01/14/2020: Summary:     A vascular evaluation was performed. The right middle cerebral artery was  studied. An IV was inserted into  the patient's left AC. Verbal informed  consent was obtained.    10 - 30 HITS were noted. Degree 2 PFO.    POSITIVE TCD Bubble study indicative of medium size right to left shunt   EKG:  EKG is not ordered today.    Recent Labs: 10/19/2019: ALT 12; BUN 13; Creatinine, Ser 0.82; Potassium 3.6; Sodium 138 10/20/2019: Hemoglobin 13.1; Platelets 268  Recent Lipid Panel    Component Value Date/Time   CHOL 140 10/20/2019 0429   TRIG 64 10/20/2019 0429   HDL 41 10/20/2019 0429   CHOLHDL 3.4 10/20/2019 0429   VLDL 13 10/20/2019 0429   LDLCALC 86 10/20/2019 0429    Physical Exam:    VS:  BP 90/60   Pulse 98   Ht 5\' 5"  (1.651 m)   Wt 114 lb (51.7 kg)   SpO2 98%   BMI 18.97 kg/m     Wt Readings from Last 3 Encounters:  01/21/20  114 lb (51.7 kg) (22 %, Z= -0.77)*  12/25/19 118 lb 6 oz (53.7 kg) (31 %, Z= -0.50)*  10/21/19 114 lb 3.2 oz (51.8 kg) (23 %, Z= -0.74)*   * Growth percentiles are based on CDC (Girls, 2-20 Years) data.     GEN:  Well nourished, well developed in no acute distress HEENT: Normal NECK: No JVD; No carotid bruits LYMPHATICS: No lymphadenopathy CARDIAC: RRR, no murmurs, rubs, gallops RESPIRATORY:  Clear to auscultation without rales, wheezing or rhonchi  ABDOMEN: Soft, non-tender, non-distended MUSCULOSKELETAL:  No edema; No deformity  SKIN: Warm and dry NEUROLOGIC:  Alert and oriented x 3 PSYCHIATRIC:  Normal affect   ASSESSMENT:    1. PFO (patent foramen ovale)    PLAN:    In order of problems listed above:  I personally reviewed extensive hospital records and imaging studies that have been performed.  The patient is a young healthy woman who presents with either a cryptogenic stroke or complex migraine.  She was treated for stroke with IV TPA and her symptoms resolved, but MRI of the brain was essentially normal.  The patient's TEE is personally reviewed.  I also reviewed her surface echocardiogram which showed right to left shunting at the interatrial level.  The TEE is somewhat limited because of patient intolerance of the procedure.  There are some frames that are highly suggestive of PFO with color-flow Doppler demonstrating bidirectional shunting.  The bubble study is unremarkable.  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  The patient's Rope score is 9.  For this patients PFO, there is a high likelihood of contribution to stroke/TIA with a RoPE score of 7 to 10  Above score calculated as 1 point each if NOT present [HTN, DM2, h/o CVA/TIA, Smoker, Cortical imaging on infarct] Above score calculated as 5 points if [Age 34 to 29], 4 points if [Age 52 to 39], 3 points if [Age 21 to 49], 2 points  if [Age 71 to 59], 1 point if [Age 39 to 69], 0 points if [Age > 70]  RoPE score   PFO-attributable fraction, percent (95% CI) 0 to 3    0 (0 to 4%) 4    38 (25 to 48%) 5    34 (21 to 45%) 6    62 (54 to 68%) 7    72 (66 to 76%) 8    84 (79 to 87%) 9, 10    88 (83 to 91%)  We discussed transcatheter PFO closure and the data that supports its place and secondary risk reduction, especially in younger patients without typical stroke risk factors.  Procedural risks, indications, and alternatives were reviewed.  The biggest issue at present is that the patient thinks there is a high likelihood she may be pregnant.  She is going to see her primary care provider next week and have a pregnancy test at that time.  She understands that she would not be a candidate for PFO closure during pregnancy because of use of x-ray.  She will contact us and let us know when she would like to move forward depending on findings of her pregnancy testing next week.  She is no longer taking any medications because of her concern that she is pregnant.  Medication Adjustments/Labs and Tests Ordered: Current medicines are reviewed at length with the patient today.  Concerns regarding medicines are outlined above.  No orders of the defined types were placed in this encounter.  No orders of the defined types were placed in this encounter.   Patient Instructions     Signed, Tonny Bollman, MD  01/23/2020 4:19 PM    [facility]

## 2020-01-23 ENCOUNTER — Encounter: Payer: Self-pay | Admitting: Cardiovascular Disease

## 2020-01-28 ENCOUNTER — Telehealth: Payer: Self-pay

## 2020-01-28 NOTE — Telephone Encounter (Signed)
The patient was in the office for PFO consult last week with concern for potential pregnancy. She stated she was going to see her PCP for a pregnancy test and contact [facility] with an update for appropriate planning (as she cannot have closure if she is pregnant due to xray use).   Left message to call back.

## 2020-02-02 NOTE — Telephone Encounter (Signed)
Corresponded with the patient via MyChart today. She states she took a home pregnancy test, it was negative, and she would like to proceed with closure. She was told Dr. Excell Seltzer would be consulted with how best to proceed and she will be contacted.

## 2020-02-06 NOTE — Telephone Encounter (Signed)
Yes - sounds like a good plan - thx!

## 2020-02-10 NOTE — Telephone Encounter (Signed)
Called earlier this afternoon and the patient requested a call after 5PM.   Called patient to discuss options. Will schedule to update H&P, draw labs and start Plavix at [facility] and arrange PFO closure 6/30.  Left message for patient she will be called in the morning to discuss.

## 2020-02-11 NOTE — Telephone Encounter (Signed)
Left message to call back  

## 2020-02-17 NOTE — Progress Notes (Signed)
[facility]   MULTIDISCIPLINARY HEART VALVE CLINIC                                       Cardiology Office Note    Date:  02/18/2020   ID:  Candice Villarreal, DOB 08/18/2001, MRN [id number]  PCP:  [facility]  Cardiologist: Dr. End  CC: set up PFO closure.   History of Present Illness:  Candice Villarreal is a 18 y.o. female with a history of migraine headaches, cryptogenic stroke and PFO with plans to proceed with PFO closure who presents to clinic for follow up.   The patient presented to [facility] in 09/2019 with symptoms of expressive aphasia, facial numbness, and right arm numbness and clumsiness. She was treated with TPA with resolution of her symptoms.  MRI of the brain was negative.  An echo bubble study was positive for right to left shunt. Otherwise there is no cardiac abnormality identified on her echocardiogram. TEE demonstrated normal LV function with some images suggestive of a PFO but no clear findings of a positive bubble study.    She was seen by Dr. Cooper for consideration of PFO closure on 01/21/20; however, there was question of a possible pregnancy due to a late menstrual cycle and daily nausea and plans were put on hold.  She has now confirmed that she is not pregnant and would like to reschedule her PFO closure. No CP or SOB. No LE edema, orthopnea or PND. No dizziness or syncope. No blood in stool or urine. No palpitations. She has been having headaches and nausea every morning. Home pregnancy test negative Last menstrual cycle was 01/25/20. No neuro symptoms.     Past Medical History:  Diagnosis Date  . Acute appendicitis 01/28/2019    Past Surgical History:  Procedure Laterality Date  . LAPAROSCOPIC APPENDECTOMY N/A 01/28/2019   Procedure: APPENDECTOMY LAPAROSCOPIC;  Surgeon: Davis, Jason Evan, MD;  Location: [facility] ORS;  Service: General;  Laterality: N/A;  . TEE WITHOUT CARDIOVERSION N/A 10/21/2019   Procedure: TRANSESOPHAGEAL  ECHOCARDIOGRAM (TEE);  Surgeon: End, Christopher, MD;  Location: [facility] ORS;  Service: Cardiovascular;  Laterality: N/A;    Current Medications: Outpatient Medications Prior to Visit  Medication Sig Dispense Refill  . acetaminophen (TYLENOL) 325 MG tablet Take 650 mg by mouth every 6 (six) hours as needed for mild pain or headache.     . Multiple Vitamins-Minerals (WOMENS MULTIVITAMIN) TABS Take 1 tablet by mouth daily.      No facility-administered medications prior to visit.     Allergies:   Patient has no known allergies.   Social History   Socioeconomic History  . Marital status: Married    Spouse name: Not on file  . Number of children: Not on file  . Years of education: Not on file  . Highest education level: Not on file  Occupational History  . Not on file  Tobacco Use  . Smoking status: Former Smoker    Quit date: 11/24/2019    Years since quitting: 0.2  . Smokeless tobacco: Never Used  Vaping Use  . Vaping Use: Never used  Substance and Sexual Activity  . Alcohol use: Not Currently  . Drug use: Not Currently    Types: Marijuana  . Sexual activity: Not on file  Other Topics Concern  . Not on file  Social History Narrative  . Not on file     Social Determinants of Health   Financial Resource Strain:   . Difficulty of Paying Living Expenses:   Food Insecurity:   . Worried About Programme researcher, broadcasting/film/video in the Last Year:   . Barista in the Last Year:   Transportation Needs:   . Freight forwarder (Medical):   Marland Kitchen Lack of Transportation (Non-Medical):   Physical Activity:   . Days of Exercise per Week:   . Minutes of Exercise per Session:   Stress:   . Feeling of Stress :   Social Connections:   . Frequency of Communication with Friends and Family:   . Frequency of Social Gatherings with Friends and Family:   . Attends Religious Services:   . Active Member of Clubs or Organizations:   . Attends Banker Meetings:   Marland Kitchen Marital Status:       Family History:  The patient's family history includes Hyperlipidemia in her mother; Hypertension in her mother.     ROS:   Please see the history of present illness.    ROS All other systems reviewed and are negative.   PHYSICAL EXAM:   VS:  BP 102/60   Pulse 86   Ht 5\' 2"  (1.575 m)   Wt 112 lb (50.8 kg)   SpO2 99%   BMI 20.49 kg/m    GEN: Well nourished, well developed, in no acute distress HEENT: normal Neck: no JVD or masses Cardiac: RRR; no murmurs, rubs, or gallops,no edema  Respiratory:  clear to auscultation bilaterally, normal work of breathing GI: soft, nontender, nondistended, + BS MS: no deformity or atrophy Skin: warm and dry, no rash Neuro:  Alert and Oriented x 3, Strength and sensation are intact Psych: euthymic mood, full affect   Wt Readings from Last 3 Encounters:  02/18/20 112 lb (50.8 kg) (18 %, Z= -0.91)*  01/21/20 114 lb (51.7 kg) (22 %, Z= -0.77)*  12/25/19 118 lb 6 oz (53.7 kg) (31 %, Z= -0.50)*   * Growth percentiles are based on CDC (Girls, 2-20 Years) data.      Studies/Labs Reviewed:   EKG:  EKG is ordered today.  The ekg ordered today demonstrates sinus rhythm HR 86  Recent Labs: 10/19/2019: ALT 12; BUN 13; Creatinine, Ser 0.82; Potassium 3.6; Sodium 138 10/20/2019: Hemoglobin 13.1; Platelets 268   Lipid Panel    Component Value Date/Time   CHOL 140 10/20/2019 0429   TRIG 64 10/20/2019 0429   HDL 41 10/20/2019 0429   CHOLHDL 3.4 10/20/2019 0429   VLDL 13 10/20/2019 0429   LDLCALC 86 10/20/2019 0429    Additional studies/ records that were reviewed today include:  Echo 10-20-2019: IMPRESSIONS    1. Left ventricular ejection fraction, by estimation, is 55 to 60%. The  left ventricle has normal function. The left ventricle has no regional  wall motion abnormalities. Left ventricular diastolic parameters were  normal.  2. Right ventricular systolic function is normal. The right ventricular  size is normal. Tricuspid  regurgitation signal is inadequate for assessing  PA pressure.  3. The mitral valve is normal in structure and function. No evidence of  mitral valve regurgitation. No evidence of mitral stenosis.  4. The aortic valve is normal in structure and function. Aortic valve  regurgitation is not visualized. No aortic stenosis is present.  5. The inferior vena cava is normal in size with greater than 50%  respiratory variability, suggesting right atrial pressure of 3 mmHg.  6. Agitated saline contrast  bubble study was positive with shunting  observed within 3-6 cardiac cycles suggestive of interatrial shunt.   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   Transesophageal echo: FINDINGS  Left Ventricle: Left ventricular ejection fraction, by estimation, is 60  to 65%. The left ventricle has normal function. The left ventricle has no  regional wall motion abnormalities. The left ventricular internal cavity  size was normal in size. There is  no left ventricular hypertrophy.   Right Ventricle: The right ventricular size is normal. Right vetricular  wall thickness was not assessed. Right ventricular systolic function is  normal.   Left Atrium: Left atrial size was not well visualized. No left atrial/left  atrial appendage thrombus was detected.   Right Atrium: Right atrial size was not well visualized.   Pericardium: There is no evidence of pericardial effusion.   Mitral Valve: The mitral valve is normal in structure and function. No  evidence of mitral valve regurgitation.   Tricuspid Valve: The tricuspid valve is normal in structure. Tricuspid  valve regurgitation is trivial.   Aortic Valve: The aortic valve is tricuspid. Aortic valve regurgitation is  not visualized.   Pulmonic Valve: The pulmonic valve was grossly normal. Pulmonic valve  regurgitation is not  visualized.   Aorta: The aortic root and ascending aorta are structurally normal, with  no evidence of dilitation.   IAS/Shunts: There is redundancy of the interatrial septum. No atrial level  shunt detected by color flow Doppler. Agitated saline contrast was given  intravenously to evaluate for intracardiac shunting. Bubble study was  negative, though Duplex images  suggest possible PFO.   Transcranial Doppler study 01/14/2020: Summary:     A vascular evaluation was performed. The right middle cerebral artery was  studied. An IV was inserted into the patient's left AC. Verbal informed  consent was obtained.    10 - 30 HITS were noted. Degree 2 PFO.    POSITIVE TCD Bubble study indicative of medium size right to left shunt    ASSESSMENT & PLAN:   PFO: The patient is a young healthy woman who presents with either a cryptogenic stroke or complex migraine.  She was treated for stroke with IV TPA and her symptoms resolved, but MRI of the brain was essentially normal.  Dr. Burt Knack reviewed her surface echocardiogram which showed right to left shunting at the interatrial level.  The TEE is somewhat limited because of patient intolerance of the procedure.  There are some frames that are highly suggestive of PFO with color-flow Doppler demonstrating bidirectional shunting. The bubble study is unremarkable. 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 She is felt to be a cndaidte for PFO closure which has been set up for 02/26/20.  The patient is counseled about the association of PFO and cryptogenic stroke. Available clinical trial data is reviewed, specifically those trials comparing transcatheter PFO closure and medical therapy with antiplatelet drugs. The patient understands the potential benefit of PFO closure with respect to secondary stroke reduction compared with medical therapy alone. Specific risks of  transcatheter PFO closure are reviewed with the patient. These risks include bleeding, infection, device embolization, stroke, cardiac perforation, tamponade, arrhythmia, MI, and late device erosion. She understands these serious risks occur at low incidence of < 1%.  The patient understands the need to take dual antiplatelet therapy with aspirin and clopidogrel together for a minimum period  of 3 months following transcatheter PFO closure.  They understand the need to follow SBE prophylaxis per guidelines for a period of 6 months following PFO closure.  The patient provides full informed consent for the procedure.      Medication Adjustments/Labs and Tests Ordered: Current medicines are reviewed at length with the patient today.  Concerns regarding medicines are outlined above.  Medication changes, Labs and Tests ordered today are listed in the Patient Instructions below. Patient Instructions  Medication Instructions:  1) START ASPIRIN 81 mg daily 2) START PLAVIX 75 mg daily *If you need a refill on your cardiac medications before your next appointment, please call your pharmacy*   CLOSURE INSTRUCTIONS: You are scheduled for a PFO CLOSURE on Wednesday, June 30 with Dr. Tonny Bollman.  1. Please arrive at the [facility] (Main Entrance A) at [facility]: [address] [place], [place] [address] at 8:30 AM (This time is two hours before your procedure to ensure your preparation). Free valet parking service is available. You are allowed ONE visitor in the waiting room during your procedure. Both you and your guest must wear masks. Special note: Every effort is made to have your procedure done on time. Please understand that emergencies sometimes delay scheduled procedures.  2. Diet: Do not eat solid foods after midnight.  You may have clear liquids until 5am upon the day of the procedure. Stay well hydrated the day before your procedure.  3. Labs: TODAY! BMET, CBC, pregnancy  4.  Medication instructions in preparation for your procedure:  1) MAKE SURE TO TAKE ASPIRIN AND PLAVIX the morning of your procedure  5. Plan for one night stay--bring personal belongings. 6. Bring a current list of your medications and current insurance cards. 7. You MUST have a responsible person to drive you home. 8. Someone MUST be with you the first 24 hours after you arrive home or your discharge will be delayed. 9. Please wear clothes that are easy to get on and off and wear slip-on shoes.   FOLLOW-UP VISIT: You will need a follow-up visit 1 month after your procedure.      Signed, Cline Crock, PA-C  02/18/2020 1:44 PM    [facility] [address] [place], [place], [place]  [address] Phone: [phone]; Fax: [phone]

## 2020-02-17 NOTE — H&P (View-Only) (Signed)
[facility]   MULTIDISCIPLINARY HEART VALVE CLINIC                                       Cardiology Office Note    Date:  02/18/2020   ID:  Candice Villarreal, DOB Aug 24, 2001, MRN [id number]  PCP:  [facility]  Cardiologist: Dr. Okey Dupre  CC: set up PFO closure.   History of Present Illness:  Candice Villarreal is a 19 y.o. female with a history of migraine headaches, cryptogenic stroke and PFO with plans to proceed with PFO closure who presents to clinic for follow up.   The patient presented to [facility] in 09/2019 with symptoms of expressive aphasia, facial numbness, and right arm numbness and clumsiness. She was treated with TPA with resolution of her symptoms.  MRI of the brain was negative.  An echo bubble study was positive for right to left shunt. Otherwise there is no cardiac abnormality identified on her echocardiogram. TEE demonstrated normal LV function with some images suggestive of a PFO but no clear findings of a positive bubble study.    She was seen by Dr. Excell Seltzer for consideration of PFO closure on 01/21/20; however, there was question of a possible pregnancy due to a late menstrual cycle and daily nausea and plans were put on hold.  She has now confirmed that she is not pregnant and would like to reschedule her PFO closure. No CP or SOB. No LE edema, orthopnea or PND. No dizziness or syncope. No blood in stool or urine. No palpitations. She has been having headaches and nausea every morning. Home pregnancy test negative Last menstrual cycle was 01/25/20. No neuro symptoms.     Past Medical History:  Diagnosis Date  . Acute appendicitis 01/28/2019    Past Surgical History:  Procedure Laterality Date  . LAPAROSCOPIC APPENDECTOMY N/A 01/28/2019   Procedure: APPENDECTOMY LAPAROSCOPIC;  Surgeon: Ancil Linsey, MD;  Location: [facility] ORS;  Service: General;  Laterality: N/A;  . TEE WITHOUT CARDIOVERSION N/A 10/21/2019   Procedure: TRANSESOPHAGEAL  ECHOCARDIOGRAM (TEE);  Surgeon: [name], MD;  Location: [facility] ORS;  Service: Cardiovascular;  Laterality: N/A;    Current Medications: Outpatient Medications Prior to Visit  Medication Sig Dispense Refill  . acetaminophen (TYLENOL) 325 MG tablet Take 650 mg by mouth every 6 (six) hours as needed for mild pain or headache.     . Multiple Vitamins-Minerals (WOMENS MULTIVITAMIN) TABS Take 1 tablet by mouth daily.      No facility-administered medications prior to visit.     Allergies:   Patient has no known allergies.   Social History   Socioeconomic History  . Marital status: Married    Spouse name: Not on file  . Number of children: Not on file  . Years of education: Not on file  . Highest education level: Not on file  Occupational History  . Not on file  Tobacco Use  . Smoking status: Former Smoker    Quit date: 11/24/2019    Years since quitting: 0.2  . Smokeless tobacco: Never Used  Vaping Use  . Vaping Use: Never used  Substance and Sexual Activity  . Alcohol use: Not Currently  . Drug use: Not Currently    Types: Marijuana  . Sexual activity: Not on file  Other Topics Concern  . Not on file  Social History Narrative  . Not on file  Social Determinants of Health   Financial Resource Strain:   . Difficulty of Paying Living Expenses:   Food Insecurity:   . Worried About Programme researcher, broadcasting/film/video in the Last Year:   . Barista in the Last Year:   Transportation Needs:   . Freight forwarder (Medical):   Marland Kitchen Lack of Transportation (Non-Medical):   Physical Activity:   . Days of Exercise per Week:   . Minutes of Exercise per Session:   Stress:   . Feeling of Stress :   Social Connections:   . Frequency of Communication with Friends and Family:   . Frequency of Social Gatherings with Friends and Family:   . Attends Religious Services:   . Active Member of Clubs or Organizations:   . Attends Banker Meetings:   Marland Kitchen Marital Status:       Family History:  The patient's family history includes Hyperlipidemia in her mother; Hypertension in her mother.     ROS:   Please see the history of present illness.    ROS All other systems reviewed and are negative.   PHYSICAL EXAM:   VS:  BP 102/60   Pulse 86   Ht 5\' 2"  (1.575 m)   Wt 112 lb (50.8 kg)   SpO2 99%   BMI 20.49 kg/m    GEN: Well nourished, well developed, in no acute distress HEENT: normal Neck: no JVD or masses Cardiac: RRR; no murmurs, rubs, or gallops,no edema  Respiratory:  clear to auscultation bilaterally, normal work of breathing GI: soft, nontender, nondistended, + BS MS: no deformity or atrophy Skin: warm and dry, no rash Neuro:  Alert and Oriented x 3, Strength and sensation are intact Psych: euthymic mood, full affect   Wt Readings from Last 3 Encounters:  02/18/20 112 lb (50.8 kg) (18 %, Z= -0.91)*  01/21/20 114 lb (51.7 kg) (22 %, Z= -0.77)*  12/25/19 118 lb 6 oz (53.7 kg) (31 %, Z= -0.50)*   * Growth percentiles are based on CDC (Girls, 2-20 Years) data.      Studies/Labs Reviewed:   EKG:  EKG is ordered today.  The ekg ordered today demonstrates sinus rhythm HR 86  Recent Labs: 10/19/2019: ALT 12; BUN 13; Creatinine, Ser 0.82; Potassium 3.6; Sodium 138 10/20/2019: Hemoglobin 13.1; Platelets 268   Lipid Panel    Component Value Date/Time   CHOL 140 10/20/2019 0429   TRIG 64 10/20/2019 0429   HDL 41 10/20/2019 0429   CHOLHDL 3.4 10/20/2019 0429   VLDL 13 10/20/2019 0429   LDLCALC 86 10/20/2019 0429    Additional studies/ records that were reviewed today include:  Echo 10-20-2019: IMPRESSIONS    1. Left ventricular ejection fraction, by estimation, is 55 to 60%. The  left ventricle has normal function. The left ventricle has no regional  wall motion abnormalities. Left ventricular diastolic parameters were  normal.  2. Right ventricular systolic function is normal. The right ventricular  size is normal. Tricuspid  regurgitation signal is inadequate for assessing  PA pressure.  3. The mitral valve is normal in structure and function. No evidence of  mitral valve regurgitation. No evidence of mitral stenosis.  4. The aortic valve is normal in structure and function. Aortic valve  regurgitation is not visualized. No aortic stenosis is present.  5. The inferior vena cava is normal in size with greater than 50%  respiratory variability, suggesting right atrial pressure of 3 mmHg.  6. Agitated saline contrast  bubble study was positive with shunting  observed within 3-6 cardiac cycles suggestive of interatrial shunt.   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   Transesophageal echo: FINDINGS  Left Ventricle: Left ventricular ejection fraction, by estimation, is 60  to 65%. The left ventricle has normal function. The left ventricle has no  regional wall motion abnormalities. The left ventricular internal cavity  size was normal in size. There is  no left ventricular hypertrophy.   Right Ventricle: The right ventricular size is normal. Right vetricular  wall thickness was not assessed. Right ventricular systolic function is  normal.   Left Atrium: Left atrial size was not well visualized. No left atrial/left  atrial appendage thrombus was detected.   Right Atrium: Right atrial size was not well visualized.   Pericardium: There is no evidence of pericardial effusion.   Mitral Valve: The mitral valve is normal in structure and function. No  evidence of mitral valve regurgitation.   Tricuspid Valve: The tricuspid valve is normal in structure. Tricuspid  valve regurgitation is trivial.   Aortic Valve: The aortic valve is tricuspid. Aortic valve regurgitation is  not visualized.   Pulmonic Valve: The pulmonic valve was grossly normal. Pulmonic valve  regurgitation is not  visualized.   Aorta: The aortic root and ascending aorta are structurally normal, with  no evidence of dilitation.   IAS/Shunts: There is redundancy of the interatrial septum. No atrial level  shunt detected by color flow Doppler. Agitated saline contrast was given  intravenously to evaluate for intracardiac shunting. Bubble study was  negative, though Duplex images  suggest possible PFO.   Transcranial Doppler study 01/14/2020: Summary:     A vascular evaluation was performed. The right middle cerebral artery was  studied. An IV was inserted into the patient's left AC. Verbal informed  consent was obtained.    10 - 30 HITS were noted. Degree 2 PFO.    POSITIVE TCD Bubble study indicative of medium size right to left shunt    ASSESSMENT & PLAN:   PFO: The patient is a young healthy woman who presents with either a cryptogenic stroke or complex migraine.  She was treated for stroke with IV TPA and her symptoms resolved, but MRI of the brain was essentially normal.  Dr. Burt Knack reviewed her surface echocardiogram which showed right to left shunting at the interatrial level.  The TEE is somewhat limited because of patient intolerance of the procedure.  There are some frames that are highly suggestive of PFO with color-flow Doppler demonstrating bidirectional shunting. The bubble study is unremarkable. 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 She is felt to be a cndaidte for PFO closure which has been set up for 02/26/20.  The patient is counseled about the association of PFO and cryptogenic stroke. Available clinical trial data is reviewed, specifically those trials comparing transcatheter PFO closure and medical therapy with antiplatelet drugs. The patient understands the potential benefit of PFO closure with respect to secondary stroke reduction compared with medical therapy alone. Specific risks of  transcatheter PFO closure are reviewed with the patient. These risks include bleeding, infection, device embolization, stroke, cardiac perforation, tamponade, arrhythmia, MI, and late device erosion. She understands these serious risks occur at low incidence of < 1%.  The patient understands the need to take dual antiplatelet therapy with aspirin and clopidogrel together for a minimum period  of 3 months following transcatheter PFO closure.  They understand the need to follow SBE prophylaxis per guidelines for a period of 6 months following PFO closure.  The patient provides full informed consent for the procedure.      Medication Adjustments/Labs and Tests Ordered: Current medicines are reviewed at length with the patient today.  Concerns regarding medicines are outlined above.  Medication changes, Labs and Tests ordered today are listed in the Patient Instructions below. Patient Instructions  Medication Instructions:  1) START ASPIRIN 81 mg daily 2) START PLAVIX 75 mg daily *If you need a refill on your cardiac medications before your next appointment, please call your pharmacy*   CLOSURE INSTRUCTIONS: You are scheduled for a PFO CLOSURE on Wednesday, June 30 with Dr. Tonny Bollman.  1. Please arrive at the [facility] (Main Entrance A) at [facility]: [address] [place], [place] [address] at 8:30 AM (This time is two hours before your procedure to ensure your preparation). Free valet parking service is available. You are allowed ONE visitor in the waiting room during your procedure. Both you and your guest must wear masks. Special note: Every effort is made to have your procedure done on time. Please understand that emergencies sometimes delay scheduled procedures.  2. Diet: Do not eat solid foods after midnight.  You may have clear liquids until 5am upon the day of the procedure. Stay well hydrated the day before your procedure.  3. Labs: TODAY! BMET, CBC, pregnancy  4.  Medication instructions in preparation for your procedure:  1) MAKE SURE TO TAKE ASPIRIN AND PLAVIX the morning of your procedure  5. Plan for one night stay--bring personal belongings. 6. Bring a current list of your medications and current insurance cards. 7. You MUST have a responsible person to drive you home. 8. Someone MUST be with you the first 24 hours after you arrive home or your discharge will be delayed. 9. Please wear clothes that are easy to get on and off and wear slip-on shoes.   FOLLOW-UP VISIT: You will need a follow-up visit 1 month after your procedure.      Signed, Cline Crock, PA-C  02/18/2020 1:44 PM    [facility] [address] [place], [place], [place]  [address] Phone: [phone]; Fax: [phone]

## 2020-02-18 ENCOUNTER — Ambulatory Visit (INDEPENDENT_AMBULATORY_CARE_PROVIDER_SITE_OTHER): Payer: Medicaid Other | Admitting: Physician Assistant

## 2020-02-18 ENCOUNTER — Other Ambulatory Visit: Payer: Self-pay

## 2020-02-18 ENCOUNTER — Encounter: Payer: Self-pay | Admitting: Physician Assistant

## 2020-02-18 VITALS — BP 102/60 | HR 86 | Ht 62.0 in | Wt 112.0 lb

## 2020-02-18 DIAGNOSIS — Q211 Atrial septal defect: Secondary | ICD-10-CM | POA: Diagnosis not present

## 2020-02-18 DIAGNOSIS — Q2112 Patent foramen ovale: Secondary | ICD-10-CM

## 2020-02-18 MED ORDER — ASPIRIN EC 81 MG PO TBEC: 81.0000 mg | DELAYED_RELEASE_TABLET | Freq: Every day | ORAL | 3 refills | Status: AC

## 2020-02-18 MED ORDER — CLOPIDOGREL BISULFATE 75 MG PO TABS
75.0000 mg | ORAL_TABLET | Freq: Every day | ORAL | 0 refills | Status: AC
Start: 2020-02-18 — End: 2020-05-23

## 2020-02-18 NOTE — Patient Instructions (Signed)
Medication Instructions:  1) START ASPIRIN 81 mg daily 2) START PLAVIX 75 mg daily *If you need a refill on your cardiac medications before your next appointment, please call your pharmacy*   CLOSURE INSTRUCTIONS: You are scheduled for a PFO CLOSURE on Wednesday, June 30 with Dr. Tonny Bollman.  1. Please arrive at the [facility] (Main Entrance A) at [facility]: [address] [place], [place] [address] at 8:30 AM (This time is two hours before your procedure to ensure your preparation). Free valet parking service is available. You are allowed ONE visitor in the waiting room during your procedure. Both you and your guest must wear masks. Special note: Every effort is made to have your procedure done on time. Please understand that emergencies sometimes delay scheduled procedures.  2. Diet: Do not eat solid foods after midnight.  You may have clear liquids until 5am upon the day of the procedure. Stay well hydrated the day before your procedure.  3. Labs: TODAY! BMET, CBC, pregnancy  4. Medication instructions in preparation for your procedure:  1) MAKE SURE TO TAKE ASPIRIN AND PLAVIX the morning of your procedure  5. Plan for one night stay--bring personal belongings. 6. Bring a current list of your medications and current insurance cards. 7. You MUST have a responsible person to drive you home. 8. Someone MUST be with you the first 24 hours after you arrive home or your discharge will be delayed. 9. Please wear clothes that are easy to get on and off and wear slip-on shoes.   FOLLOW-UP VISIT: You will need a follow-up visit 1 month after your procedure.

## 2020-02-19 LAB — CBC WITH DIFFERENTIAL/PLATELET
Basophils Absolute: 0.1 10*3/uL (ref 0.0–0.2)
Basos: 1 %
EOS (ABSOLUTE): 0.2 10*3/uL (ref 0.0–0.4)
Eos: 2 %
Hematocrit: 44.4 % (ref 34.0–46.6)
Hemoglobin: 15.1 g/dL (ref 11.1–15.9)
Immature Grans (Abs): 0 10*3/uL (ref 0.0–0.1)
Immature Granulocytes: 0 %
Lymphocytes Absolute: 2.5 10*3/uL (ref 0.7–3.1)
Lymphs: 28 %
MCH: 29.7 pg (ref 26.6–33.0)
MCHC: 34 g/dL (ref 31.5–35.7)
MCV: 87 fL (ref 79–97)
Monocytes Absolute: 0.4 10*3/uL (ref 0.1–0.9)
Monocytes: 4 %
Neutrophils Absolute: 5.9 10*3/uL (ref 1.4–7.0)
Neutrophils: 65 %
Platelets: 296 10*3/uL (ref 150–450)
RBC: 5.08 x10E6/uL (ref 3.77–5.28)
RDW: 11.9 % (ref 11.7–15.4)
WBC: 9.1 10*3/uL (ref 3.4–10.8)

## 2020-02-19 LAB — BASIC METABOLIC PANEL
BUN/Creatinine Ratio: 16 (ref 9–23)
BUN: 11 mg/dL (ref 6–20)
CO2: 23 mmol/L (ref 20–29)
Calcium: 9.9 mg/dL (ref 8.7–10.2)
Chloride: 100 mmol/L (ref 96–106)
Creatinine, Ser: 0.7 mg/dL (ref 0.57–1.00)
GFR calc Af Amer: 145 mL/min/{1.73_m2} (ref >59)
GFR calc non Af Amer: 126 mL/min/{1.73_m2} (ref >59)
Glucose: 89 mg/dL (ref 65–99)
Potassium: 4.9 mmol/L (ref 3.5–5.2)
Sodium: 138 mmol/L (ref 134–144)

## 2020-02-19 LAB — HCG, SERUM, QUALITATIVE: hCG,Beta Subunit,Qual,Serum: NEGATIVE m[IU]/mL (ref <6)

## 2020-02-25 ENCOUNTER — Other Ambulatory Visit: Payer: Self-pay

## 2020-02-25 ENCOUNTER — Encounter (HOSPITAL_COMMUNITY)
Admission: RE | Disposition: A | Discharge status: Home / Self Care | Payer: Self-pay | Source: Home / Self Care | Attending: Cardiovascular Disease

## 2020-02-25 ENCOUNTER — Ambulatory Visit (HOSPITAL_COMMUNITY)
Admission: RE | Admit: 2020-02-25 | Discharge: 2020-02-25 | Disposition: A | Discharge status: Home / Self Care | Payer: Medicaid Other | Attending: Cardiovascular Disease | Admitting: Cardiovascular Disease

## 2020-02-25 ENCOUNTER — Ambulatory Visit (HOSPITAL_BASED_OUTPATIENT_CLINIC_OR_DEPARTMENT_OTHER): Payer: Medicaid Other

## 2020-02-25 DIAGNOSIS — G43109 Migraine with aura, not intractable, without status migrainosus: Secondary | ICD-10-CM | POA: Insufficient documentation

## 2020-02-25 DIAGNOSIS — Z87891 Personal history of nicotine dependence: Secondary | ICD-10-CM | POA: Insufficient documentation

## 2020-02-25 DIAGNOSIS — Z8673 Personal history of transient ischemic attack (TIA), and cerebral infarction without residual deficits: Secondary | ICD-10-CM | POA: Insufficient documentation

## 2020-02-25 DIAGNOSIS — Q211 Atrial septal defect: Secondary | ICD-10-CM | POA: Diagnosis present

## 2020-02-25 DIAGNOSIS — Z8249 Family history of ischemic heart disease and other diseases of the circulatory system: Secondary | ICD-10-CM | POA: Diagnosis not present

## 2020-02-25 DIAGNOSIS — Q2112 Patent foramen ovale: Secondary | ICD-10-CM

## 2020-02-25 HISTORY — PX: PATENT FORAMEN OVALE(PFO) CLOSURE: CATH118300

## 2020-02-25 LAB — POCT ACTIVATED CLOTTING TIME
Activated Clotting Time: 230 seconds
Activated Clotting Time: 158 seconds

## 2020-02-25 LAB — GLUCOSE, CAPILLARY: Glucose-Capillary: 95 mg/dL (ref 70–99)

## 2020-02-25 LAB — PREGNANCY, URINE: Preg Test, Ur: NEGATIVE

## 2020-02-25 LAB — ECHOCARDIOGRAM LIMITED
Height: 62 in
Weight: 1808 oz

## 2020-02-25 SURGERY — PATENT FORAMEN OVALE (PFO) CLOSURE
Anesthesia: LOCAL

## 2020-02-25 MED ORDER — ACETAMINOPHEN 325 MG PO TABS: 650.0000 mg | ORAL_TABLET | ORAL | Status: DC | PRN

## 2020-02-25 MED ORDER — HEPARIN (PORCINE) IN NACL 1000-0.9 UT/500ML-% IV SOLN
INTRAVENOUS | Status: DC | PRN
  Administered 2020-02-25 (×2): 500 mL

## 2020-02-25 MED ORDER — CEFAZOLIN SODIUM-DEXTROSE 2-3 GM-%(50ML) IV SOLR
INTRAVENOUS | Status: AC | PRN
  Administered 2020-02-25: 2 g via INTRAVENOUS

## 2020-02-25 MED ORDER — SODIUM CHLORIDE 0.9 % IV SOLN: 250.0000 mL | INTRAVENOUS | Status: DC | PRN

## 2020-02-25 MED ORDER — LIDOCAINE HCL (PF) 1 % IJ SOLN
INTRAMUSCULAR | Status: AC
  Filled 2020-02-25: qty 30

## 2020-02-25 MED ORDER — SODIUM CHLORIDE 0.9% FLUSH: 3.0000 mL | Freq: Two times a day (BID) | INTRAVENOUS | Status: DC

## 2020-02-25 MED ORDER — LABETALOL HCL 5 MG/ML IV SOLN: 10.0000 mg | INTRAVENOUS | Status: DC | PRN

## 2020-02-25 MED ORDER — FENTANYL CITRATE (PF) 100 MCG/2ML IJ SOLN
INTRAMUSCULAR | Status: AC
  Filled 2020-02-25: qty 2

## 2020-02-25 MED ORDER — ONDANSETRON HCL 4 MG/2ML IJ SOLN: 4.0000 mg | Freq: Four times a day (QID) | INTRAMUSCULAR | Status: DC | PRN

## 2020-02-25 MED ORDER — SODIUM CHLORIDE 0.9 % WEIGHT BASED INFUSION: 1.0000 mL/kg/h | INTRAVENOUS | Status: DC

## 2020-02-25 MED ORDER — HEPARIN SODIUM (PORCINE) 1000 UNIT/ML IJ SOLN
INTRAMUSCULAR | Status: AC
  Filled 2020-02-25: qty 1

## 2020-02-25 MED ORDER — CEFAZOLIN SODIUM-DEXTROSE 2-4 GM/100ML-% IV SOLN: 2.0000 g | INTRAVENOUS | Status: DC

## 2020-02-25 MED ORDER — HEPARIN (PORCINE) IN NACL 1000-0.9 UT/500ML-% IV SOLN
INTRAVENOUS | Status: AC
  Filled 2020-02-25: qty 500

## 2020-02-25 MED ORDER — LIDOCAINE HCL (PF) 1 % IJ SOLN
INTRAMUSCULAR | Status: DC | PRN
  Administered 2020-02-25: 20 mL via INTRADERMAL

## 2020-02-25 MED ORDER — CLOPIDOGREL BISULFATE 75 MG PO TABS: 75.0000 mg | ORAL_TABLET | ORAL | Status: DC

## 2020-02-25 MED ORDER — SODIUM CHLORIDE 0.9 % WEIGHT BASED INFUSION
3.0000 mL/kg/h | INTRAVENOUS | Status: AC
  Administered 2020-02-25: 3 mL/kg/h via INTRAVENOUS

## 2020-02-25 MED ORDER — MIDAZOLAM HCL 2 MG/2ML IJ SOLN
INTRAMUSCULAR | Status: AC
  Filled 2020-02-25: qty 2

## 2020-02-25 MED ORDER — CEFAZOLIN SODIUM-DEXTROSE 2-4 GM/100ML-% IV SOLN
INTRAVENOUS | Status: AC
  Filled 2020-02-25: qty 100

## 2020-02-25 MED ORDER — SODIUM CHLORIDE 0.9% FLUSH: 3.0000 mL | INTRAVENOUS | Status: DC | PRN

## 2020-02-25 MED ORDER — MIDAZOLAM HCL 2 MG/2ML IJ SOLN
INTRAMUSCULAR | Status: DC | PRN
  Administered 2020-02-25 (×2): 1 mg via INTRAVENOUS
  Administered 2020-02-25: 2 mg via INTRAVENOUS

## 2020-02-25 MED ORDER — HEPARIN SODIUM (PORCINE) 1000 UNIT/ML IJ SOLN
INTRAMUSCULAR | Status: DC | PRN
  Administered 2020-02-25: 4000 [IU] via INTRAVENOUS

## 2020-02-25 MED ORDER — HYDRALAZINE HCL 20 MG/ML IJ SOLN: 10.0000 mg | INTRAMUSCULAR | Status: DC | PRN

## 2020-02-25 MED ORDER — ASPIRIN 81 MG PO CHEW: 81.0000 mg | CHEWABLE_TABLET | ORAL | Status: DC

## 2020-02-25 MED ORDER — FENTANYL CITRATE (PF) 100 MCG/2ML IJ SOLN
INTRAMUSCULAR | Status: DC | PRN
  Administered 2020-02-25 (×3): 25 ug via INTRAVENOUS

## 2020-02-25 SURGICAL SUPPLY — 13 items
CATH ACUNAV REPROCESSED (CATHETERS) ×2 IMPLANT
CATH SUPER TORQUE PLUS 6F MPA1 (CATHETERS) ×4 IMPLANT
COVER SWIFTLINK CONNECTOR (BAG) ×2 IMPLANT
GUIDEWIRE AMPLATZER 1.5JX260 (WIRE) ×2 IMPLANT
KIT MICROPUNCTURE NIT STIFF (SHEATH) ×2 IMPLANT
OCCLUDER AMPLATZER PFO 18MM (Prosthesis & Implant Heart) ×2 IMPLANT
PACK CARDIAC CATHETERIZATION (CUSTOM PROCEDURE TRAY) ×2 IMPLANT
PROTECTION STATION PRESSURIZED (MISCELLANEOUS) ×2
SHEATH INTROD W/O MIN 9FR 25CM (SHEATH) ×2 IMPLANT
SHEATH PINNACLE 8F 10CM (SHEATH) ×2 IMPLANT
SHEATH PROBE COVER 6X72 (BAG) ×2 IMPLANT
STATION PROTECTION PRESSURIZED (MISCELLANEOUS) ×1 IMPLANT
WIRE EMERALD 3MM-J .035X150CM (WIRE) ×2 IMPLANT

## 2020-02-25 NOTE — Discharge Instructions (Signed)
Femoral Site Care This sheet gives you information about how to care for yourself after your procedure. Your health care provider may also give you more specific instructions. If you have problems or questions, contact your health care provider. What can I expect after the procedure? After the procedure, it is common to have:  Bruising that usually fades within 1-2 weeks.  Tenderness at the site. Follow these instructions at home: Wound care  Follow instructions from your health care provider about how to take care of your insertion site. Make sure you: ? Wash your hands with soap and water before you change your bandage (dressing). If soap and water are not available, use hand sanitizer. ? Change your dressing as told by your health care provider. ? Leave stitches (sutures), skin glue, or adhesive strips in place. These skin closures may need to stay in place for 2 weeks or longer. If adhesive strip edges start to loosen and curl up, you may trim the loose edges. Do not remove adhesive strips completely unless your health care provider tells you to do that.  Do not take baths, swim, or use a hot tub until your health care provider approves.  You may shower 24-48 hours after the procedure or as told by your health care provider. ? Gently wash the site with plain soap and water. ? Pat the area dry with a clean towel. ? Do not rub the site. This may cause bleeding.  Do not apply powder or lotion to the site. Keep the site clean and dry.  Check your femoral site every day for signs of infection. Check for: ? Redness, swelling, or pain. ? Fluid or blood. ? Warmth. ? Pus or a bad smell. Activity  For the first 2-3 days after your procedure, or as long as directed: ? Avoid climbing stairs as much as possible. ? Do not squat.  Do not lift anything that is heavier than 10 lb (4.5 kg), or the limit that you are told, until your health care provider says that it is safe.  Rest as  directed. ? Avoid sitting for a long time without moving. Get up to take short walks every 1-2 hours.  Do not drive for 24 hours if you were given a medicine to help you relax (sedative). General instructions  Take over-the-counter and prescription medicines only as told by your health care provider.  Keep all follow-up visits as told by your health care provider. This is important. Contact a health care provider if you have:  A fever or chills.  You have redness, swelling, or pain around your insertion site. Get help right away if:  The catheter insertion area swells very fast.  You pass out.  You suddenly start to sweat or your skin gets clammy.  The catheter insertion area is bleeding, and the bleeding does not stop when you hold steady pressure on the area.  The area near or just beyond the catheter insertion site becomes pale, cool, tingly, or numb. These symptoms may represent a serious problem that is an emergency. Do not wait to see if the symptoms will go away. Get medical help right away. Call your local emergency services (911 in the [place] Do not drive yourself to the hospital. Summary  After the procedure, it is common to have bruising that usually fades within 1-2 weeks.  Check your femoral site every day for signs of infection.  Do not lift anything that is heavier than 10 lb (4.5 kg), or the   limit that you are told, until your health care provider says that it is safe. This information is not intended to replace advice given to you by your health care provider. Make sure you discuss any questions you have with your health care provider. Document Revised: 08/27/2017 Document Reviewed: 08/27/2017 Elsevier Patient Education  2020 [employer]vier Inc.  

## 2020-02-25 NOTE — Progress Notes (Signed)
Site area- right  Site Prior to Removal- 0   Pressure Applied For-  30 MInutes   Bedrest Beginning at - 1235   Manual- Yes   Patient Status During Pull- Stable    Post Pull Groin Site- 0   Post Pull Instructions Given- Yes   Post Pull Pulses Present- Yes    Dressing Applied- Tegaderm and Gauze Dressing    Comments:

## 2020-02-25 NOTE — Progress Notes (Signed)
  Echocardiogram 2D Echocardiogram has been performed.  Candice Villarreal M 02/25/2020, 2:13 PM

## 2020-02-25 NOTE — Interval H&P Note (Signed)
History and Physical Interval Note:  02/25/2020 9:41 AM  Candice Villarreal  has presented today for surgery, with the diagnosis of pfo.  The various methods of treatment have been discussed with the patient and family. After consideration of risks, benefits and other options for treatment, the patient has consented to  Procedure(s): PATENT FORAMEN OVALE (PFO) CLOSURE (N/A) as a surgical intervention.  The patient's history has been reviewed, patient examined, no change in status, stable for surgery.  I have reviewed the patient's chart and labs.  Questions were answered to the patient's satisfaction.     Tonny Bollman

## 2020-02-26 ENCOUNTER — Encounter (HOSPITAL_COMMUNITY): Payer: Self-pay | Admitting: Cardiovascular Disease

## 2020-03-25 ENCOUNTER — Ambulatory Visit: Payer: Medicaid Other | Admitting: Physician Assistant

## 2020-03-25 NOTE — Progress Notes (Deleted)
HEART AND VASCULAR CENTER   MULTIDISCIPLINARY HEART VALVE CLINIC                                       Cardiology Office Note    Date:  03/25/2020   ID:  Candice Villarreal, DOB [birth year], MRN [id number]  PCP:  [facility]  Cardiologist: Dr. Okey DupreEnd  CC: set up PFO closure.   History of Present Illness:  Candice Villarreal is a 19 y.o. female with a history of migraine headaches, cryptogenic stroke and PFO s/p PFO closure (02/25/20) who presents to clinic for follow up.   The patient [facility] with symptoms of expressive aphasia, facial numbness, and right arm numbness and clumsiness. She was treated with TPA with resolution of her symptoms.  MRI of the brain was negative.  An echo bubble study was positive for right to left shunt. Otherwise there is no cardiac abnormality identified on her echocardiogram. TEE demonstrated normal LV function with some images suggestive of a PFO but no clear findings of a positive bubble study.    She was seen by Dr. Excell Seltzerooper for consideration of PFO closure on 01/21/20; however, there was question of a possible pregnancy due to a late menstrual cycle and daily nausea and plans were put on hold. She was confirmed to not be pregnant and later underwent successful transcatheter PFO closure using an 18 mm Amplatzer PFO occluder on 02/25/20. Post op limited echo was normal with no residual shunting. She was discharged on aspirin and plavix.   Today she presents to clinic for follow up.      Past Medical History:  Diagnosis Date  . Acute appendicitis 01/28/2019  . Expressive aphasia 10/19/2019  . Numbness and tingling of right face 10/19/2019  . Numbness and tingling of right side of face 10/20/2019  . PFO (patent foramen ovale) 10/20/2019  . Right arm weakness 10/19/2019  . TIA (transient ischemic attack)   . Tobacco use disorder 10/19/2019    Past Surgical History:  Procedure Laterality Date  . LAPAROSCOPIC APPENDECTOMY N/A  01/28/2019   Procedure: APPENDECTOMY LAPAROSCOPIC;  Surgeon: Ancil Linseyavis, Jason Evan, MD;  Location: [facility] ORS;  Service: General;  Laterality: N/A;  . PATENT FORAMEN OVALE(PFO) CLOSURE N/A 02/25/2020   Procedure: PATENT FORAMEN OVALE (PFO) CLOSURE;  Surgeon: Tonny Bollmanooper, Michael, MD;  [facility];  Service: Cardiovascular;  Laterality: N/A;  . TEE WITHOUT CARDIOVERSION N/A 10/21/2019   Procedure: TRANSESOPHAGEAL ECHOCARDIOGRAM (TEE);  Surgeon: Yvonne KendallEnd, Christopher, MD;  Location: [facility] ORS;  Service: Cardiovascular;  Laterality: N/A;    Current Medications: Outpatient Medications Prior to Visit  Medication Sig Dispense Refill  . acetaminophen (TYLENOL) 325 MG tablet Take 650 mg by mouth every 6 (six) hours as needed for mild pain or headache.     Marland Kitchen. aspirin EC 81 MG tablet Take 1 tablet (81 mg total) by mouth daily. Swallow whole. 90 tablet 3  . clopidogrel (PLAVIX) 75 MG tablet Take 1 tablet (75 mg total) by mouth daily. 95 tablet 0  . Multiple Vitamins-Minerals (WOMENS MULTIVITAMIN) TABS Take 1 tablet by mouth daily.      No facility-administered medications prior to visit.     Allergies:   Patient has no known allergies.   Social History   Socioeconomic History  . Marital status: Married    Spouse name: Not on file  . Number of children: Not  on file  . Years of education: Not on file  . Highest education level: Not on file  Occupational History  . Not on file  Tobacco Use  . Smoking status: Former Smoker    Quit date: 11/24/2019    Years since quitting: 0.3  . Smokeless tobacco: Never Used  Vaping Use  . Vaping Use: Never used  Substance and Sexual Activity  . Alcohol use: Not Currently  . Drug use: Not Currently    Types: Marijuana  . Sexual activity: Not on file  Other Topics Concern  . Not on file  Social History Narrative  . Not on file   Social Determinants of Health   Financial Resource Strain:   . Difficulty of Paying Living Expenses:   Food Insecurity:   .  Worried About Programme researcher, broadcasting/film/video in the Last Year:   . Barista in the Last Year:   Transportation Needs:   . Freight forwarder (Medical):   Marland Kitchen Lack of Transportation (Non-Medical):   Physical Activity:   . Days of Exercise per Week:   . Minutes of Exercise per Session:   Stress:   . Feeling of Stress :   Social Connections:   . Frequency of Communication with Friends and Family:   . Frequency of Social Gatherings with Friends and Family:   . Attends Religious Services:   . Active Member of Clubs or Organizations:   . Attends Banker Meetings:   Marland Kitchen Marital Status:      Family History:  The patient's family history includes Hyperlipidemia in her mother; Hypertension in her mother.     ROS:   Please see the history of present illness.    ROS All other systems reviewed and are negative.   PHYSICAL EXAM:   VS:  There were no vitals taken for this visit.   GEN: Well nourished, well developed, in no acute distress HEENT: normal Neck: no JVD or masses Cardiac: RRR; no murmurs, rubs, or gallops,no edema  Respiratory:  clear to auscultation bilaterally, normal work of breathing GI: soft, nontender, nondistended, + BS MS: no deformity or atrophy Skin: warm and dry, no rash Neuro:  Alert and Oriented x 3, Strength and sensation are intact Psych: euthymic mood, full affect   Wt Readings from Last 3 Encounters:  02/25/20 113 lb (51.3 kg) (20 %, Z= -0.84)*  02/18/20 112 lb (50.8 kg) (18 %, Z= -0.91)*  01/21/20 114 lb (51.7 kg) (22 %, Z= -0.77)*   * Growth percentiles are based on CDC (Girls, 2-20 Years) data.      Studies/Labs Reviewed:   EKG:  EKG is ordered today.  The ekg ordered today demonstrates sinus rhythm HR 86  Recent Labs: 10/19/2019: ALT 12 02/18/2020: BUN 11; Creatinine, Ser 0.70; Hemoglobin 15.1; Platelets 296; Potassium 4.9; Sodium 138   Lipid Panel    Component Value Date/Time   CHOL 140 10/20/2019 0429   TRIG 64 10/20/2019 0429    HDL 41 10/20/2019 0429   CHOLHDL 3.4 10/20/2019 0429   VLDL 13 10/20/2019 0429   LDLCALC 86 10/20/2019 0429    Additional studies/ records that were reviewed today include:  Echo 10-20-2019: IMPRESSIONS    1. Left ventricular ejection fraction, by estimation, is 55 to 60%. The  left ventricle has normal function. The left ventricle has no regional  wall motion abnormalities. Left ventricular diastolic parameters were  normal.  2. Right ventricular systolic function is normal. The right ventricular  size is normal. Tricuspid regurgitation signal is inadequate for assessing  PA pressure.  3. The mitral valve is normal in structure and function. No evidence of  mitral valve regurgitation. No evidence of mitral stenosis.  4. The aortic valve is normal in structure and function. Aortic valve  regurgitation is not visualized. No aortic stenosis is present.  5. The inferior vena cava is normal in size with greater than 50%  respiratory variability, suggesting right atrial pressure of 3 mmHg.  6. Agitated saline contrast bubble study was positive with shunting  observed within 3-6 cardiac cycles suggestive of interatrial shunt.   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   Transesophageal echo: FINDINGS  Left Ventricle: Left ventricular ejection fraction, by estimation, is 60  to 65%. The left ventricle has normal function. The left ventricle has no  regional wall motion abnormalities. The left ventricular internal cavity  size was normal in size. There is  no left ventricular hypertrophy.   Right Ventricle: The right ventricular size is normal. Right vetricular  wall thickness was not assessed. Right ventricular systolic function is  normal.   Left Atrium: Left atrial size was not well visualized. No left atrial/left  atrial appendage thrombus was detected.     Right Atrium: Right atrial size was not well visualized.   Pericardium: There is no evidence of pericardial effusion.   Mitral Valve: The mitral valve is normal in structure and function. No  evidence of mitral valve regurgitation.   Tricuspid Valve: The tricuspid valve is normal in structure. Tricuspid  valve regurgitation is trivial.   Aortic Valve: The aortic valve is tricuspid. Aortic valve regurgitation is  not visualized.   Pulmonic Valve: The pulmonic valve was grossly normal. Pulmonic valve  regurgitation is not visualized.   Aorta: The aortic root and ascending aorta are structurally normal, with  no evidence of dilitation.   IAS/Shunts: There is redundancy of the interatrial septum. No atrial level  shunt detected by color flow Doppler. Agitated saline contrast was given  intravenously to evaluate for intracardiac shunting. Bubble study was  negative, though Duplex images  suggest possible PFO.   Transcranial Doppler study 01/14/2020: Summary:     A vascular evaluation was performed. The right middle cerebral artery was  studied. An IV was inserted into the patient's left AC. Verbal informed  consent was obtained.    10 - 30 HITS were noted. Degree 2 PFO.    POSITIVE TCD Bubble study indicative of medium size right to left shunt   __________________   02/25/20 PATENT FORAMEN OVALE (PFO) CLOSURE  Conclusion  Successful transcatheter PFO closure using an 18 mm Amplatzer PFO occluder  Recommend:  Post-procedure limited echo prior to DC  ASA/clopidogrel x 3 months without interruption  SBE prophylaxis x 6 months per protocol  Same day DC per protocol  __________________   Echo limited 02/25/20 IMPRESSIONS  1. Left ventricular ejection fraction, by estimation, is 60 to 65%. The  left ventricle has normal function. Left ventricular diastolic parameters  were normal.  2. 18 mm Amplatzer PFO occluder in place. No residual shunting noted by   color flow Doppler.  3. The mitral valve is normal in structure. No evidence of mitral valve  regurgitation. No evidence of mitral stenosis.  4. The aortic valve is tricuspid. Aortic valve regurgitation is not  visualized. No aortic stenosis is present.  5. There is normal pulmonary artery systolic pressure.  ASSESSMENT & PLAN:   PFO s/p PFO closure:    Medication Adjustments/Labs and Tests Ordered: Current medicines are reviewed at length with the patient today.  Concerns regarding medicines are outlined above.  Medication changes, Labs and Tests ordered today are listed in the Patient Instructions below. There are no Patient Instructions on file for this visit.   Byrd Hesselbach, PA-C  03/25/2020 10:43 AM    [facility] [address] [place], [place], [place]  [address] Phone: [phone]; Fax: [phone]

## 2020-04-12 ENCOUNTER — Telehealth: Payer: Medicaid Other | Admitting: Physician Assistant

## 2020-04-12 ENCOUNTER — Telehealth (INDEPENDENT_AMBULATORY_CARE_PROVIDER_SITE_OTHER): Payer: Medicaid Other | Admitting: Physician Assistant

## 2020-04-12 ENCOUNTER — Ambulatory Visit: Payer: Medicaid Other | Admitting: Physician Assistant

## 2020-04-12 ENCOUNTER — Other Ambulatory Visit: Payer: Self-pay | Admitting: Physician Assistant

## 2020-04-12 ENCOUNTER — Other Ambulatory Visit: Payer: Self-pay

## 2020-04-12 DIAGNOSIS — Z8774 Personal history of (corrected) congenital malformations of heart and circulatory system: Secondary | ICD-10-CM

## 2020-04-12 NOTE — Patient Instructions (Signed)
Please continue on all your same medications. You will stop plavix on 05/27/20. If you need dental work (including routine cleanings) in the first 6 months from your PFO closure you will require antibiotics before that appointment. I will see you back in 1 year with an echo with bubble study, which has been scheduled today.    It was nice to talk to you, and I am glad you are doing so well.   Cline Crock PA-C  [facility]

## 2020-04-12 NOTE — Progress Notes (Signed)
HEART AND VASCULAR CENTER   MULTIDISCIPLINARY HEART VALVE TEAM  Virtual Visit via Telephone Note   This visit type was conducted due to national recommendations for restrictions regarding the COVID-19 Pandemic (e.g. social distancing) in an effort to limit this patient's exposure and mitigate transmission in our community.  Due to her co-morbid illnesses, this patient is at least at moderate risk for complications without adequate follow up.  This format is felt to be most appropriate for this patient at this time.  The patient did not have access to video technology/had technical difficulties with video requiring transitioning to audio format only (telephone).  All issues noted in this document were discussed and addressed.  No physical exam could be performed with this format.  Please refer to the patient's chart for her  consent to telehealth for [facility].   Evaluation Performed:  Follow-up visit  Date:  04/12/2020   ID:  Candice Villarreal, DOB 09-30-2000, MRN [id number]  Patient Location: Home Provider Location: Home Office  [facility]:  [facility]  Cardiologist:  Yvonne Kendallhristopher End, MD  Electrophysiologist:  None   Chief Complaint:  follow up s/p PFO closure  History of Present Illness:    Candice Villarreal is a 19 y.o. female with a history of migraine headaches, cryptogenic stroke and PFO s/p PFO closure (02/25/20) who presents to clinic for follow up.   The [facility] with symptoms of expressive aphasia, facial numbness, and right arm numbness and clumsiness. She was treated with TPA with resolution of her symptoms. MRI of the brain was negative. An echo bubble study was positive for right to left shunt. Otherwise there is no cardiac abnormality identified on her echocardiogram. TEE demonstrated normal LV function with some images suggestive of a PFO but no clear findings of a positive bubble study.   She was seen by Dr. Excell Seltzerooper for  consideration of PFO closure on 01/21/20; however, there was question of a possible pregnancy due to a late menstrual cycle and daily nausea and plans were put on hold. She was confirmed to not be pregnant and later underwent successful transcatheter PFO closure using an 18 mm Amplatzer PFO occluder on 02/25/20. Post op limited echo was normal with no residual shunting. She was discharged on aspirin and plavix.   Today she presents to clinic for follow up. No CP or SOB. No LE edema, orthopnea or PND. No dizziness or syncope. No blood in stool or urine. No palpitations. No new neurologic symptoms. She says she is doing quite well with no issues. She says she feels better on the days that she does take her medications. The patient does not have symptoms concerning for COVID-19 infection (fever, chills, cough, or new shortness of breath).    Past Medical History:  Diagnosis Date  . Acute appendicitis 01/28/2019  . Expressive aphasia 10/19/2019  . Numbness and tingling of right face 10/19/2019  . Numbness and tingling of right side of face 10/20/2019  . PFO (patent foramen ovale) 10/20/2019  . Right arm weakness 10/19/2019  . TIA (transient ischemic attack)   . Tobacco use disorder 10/19/2019   Past Surgical History:  Procedure Laterality Date  . LAPAROSCOPIC APPENDECTOMY N/A 01/28/2019   Procedure: APPENDECTOMY LAPAROSCOPIC;  Surgeon: Ancil Linseyavis, Jason Evan, MD;  Location: [facility] ORS;  Service: General;  Laterality: N/A;  . PATENT FORAMEN OVALE(PFO) CLOSURE N/A 02/25/2020   Procedure: PATENT FORAMEN OVALE (PFO) CLOSURE;  Surgeon: Tonny Bollmanooper, Michael, MD;  [facility];  Service: Cardiovascular;  Laterality: N/A;  . TEE WITHOUT CARDIOVERSION N/A 10/21/2019   Procedure: TRANSESOPHAGEAL ECHOCARDIOGRAM (TEE);  Surgeon: [name], MD;  Location: [facility] ORS;  Service: Cardiovascular;  Laterality: N/A;     No outpatient medications have been marked as taking for the 04/12/20 encounter (Video Visit) with  Janetta Hora, PA-C.     Allergies:   Patient has no known allergies.   Social History   Tobacco Use  . Smoking status: Former Smoker    Quit date: 11/24/2019    Years since quitting: 0.3  . Smokeless tobacco: Never Used  Vaping Use  . Vaping Use: Never used  Substance Use Topics  . Alcohol use: Not Currently  . Drug use: Not Currently    Types: Marijuana     Family Hx: The patient's family history includes Hyperlipidemia in her mother; Hypertension in her mother.  ROS:   Please see the history of present illness.    All other systems reviewed and are negative.   Prior CV studies:   The following studies were reviewed today:  Echo 10-20-2019: IMPRESSIONS    1. Left ventricular ejection fraction, by estimation, is 55 to 60%. The  left ventricle has normal function. The left ventricle has no regional  wall motion abnormalities. Left ventricular diastolic parameters were  normal.  2. Right ventricular systolic function is normal. The right ventricular  size is normal. Tricuspid regurgitation signal is inadequate for assessing  PA pressure.  3. The mitral valve is normal in structure and function. No evidence of  mitral valve regurgitation. No evidence of mitral stenosis.  4. The aortic valve is normal in structure and function. Aortic valve  regurgitation is not visualized. No aortic stenosis is present.  5. The inferior vena cava is normal in size with greater than 50%  respiratory variability, suggesting right atrial pressure of 3 mmHg.  6. Agitated saline contrast bubble study was positive with shunting  observed within 3-6 cardiac cycles suggestive of interatrial shunt.   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   Transesophageal echo: FINDINGS  Left Ventricle: Left ventricular ejection fraction, by estimation, is 60  to 65%. The  left ventricle has normal function. The left ventricle has no  regional wall motion abnormalities. The left ventricular internal cavity  size was normal in size. There is  no left ventricular hypertrophy.   Right Ventricle: The right ventricular size is normal. Right vetricular  wall thickness was not assessed. Right ventricular systolic function is  normal.   Left Atrium: Left atrial size was not well visualized. No left atrial/left  atrial appendage thrombus was detected.   Right Atrium: Right atrial size was not well visualized.   Pericardium: There is no evidence of pericardial effusion.   Mitral Valve: The mitral valve is normal in structure and function. No  evidence of mitral valve regurgitation.   Tricuspid Valve: The tricuspid valve is normal in structure. Tricuspid  valve regurgitation is trivial.   Aortic Valve: The aortic valve is tricuspid. Aortic valve regurgitation is  not visualized.   Pulmonic Valve: The pulmonic valve was grossly normal. Pulmonic valve  regurgitation is not visualized.   Aorta: The aortic root and ascending aorta are structurally normal, with  no evidence of dilitation.   IAS/Shunts: There is redundancy of the interatrial septum. No atrial level  shunt detected by color flow Doppler. Agitated saline contrast was given  intravenously to evaluate for  intracardiac shunting. Bubble study was  negative, though Duplex images  suggest possible PFO.   Transcranial Doppler study 01/14/2020: Summary:     A vascular evaluation was performed. The right middle cerebral artery was  studied. An IV was inserted into the patient's left AC. Verbal informed  consent was obtained.    10 - 30 HITS were noted. Degree 2 PFO.    POSITIVE TCD Bubble study indicative of medium size right to left shunt   __________________   02/25/20 PATENT FORAMEN OVALE (PFO) CLOSURE  Conclusion  Successful transcatheter PFO closure using an 18 mm Amplatzer PFO  occluder  Recommend:  Post-procedure limited echo prior to DC  ASA/clopidogrel x 3 months without interruption  SBE prophylaxis x 6 months per protocol  Same day DC per protocol  __________________   Echo limited 02/25/20 IMPRESSIONS  1. Left ventricular ejection fraction, by estimation, is 60 to 65%. The  left ventricle has normal function. Left ventricular diastolic parameters  were normal.  2. 18 mm Amplatzer PFO occluder in place. No residual shunting noted by  color flow Doppler.  3. The mitral valve is normal in structure. No evidence of mitral valve  regurgitation. No evidence of mitral stenosis.  4. The aortic valve is tricuspid. Aortic valve regurgitation is not  visualized. No aortic stenosis is present.  5. There is normal pulmonary artery systolic pressure.    Labs/Other Tests and Data Reviewed:    EKG:  No ECG reviewed.  Recent Labs: 10/19/2019: ALT 12 02/18/2020: BUN 11; Creatinine, Ser 0.70; Hemoglobin 15.1; Platelets 296; Potassium 4.9; Sodium 138   Recent Lipid Panel Lab Results  Component Value Date/Time   CHOL 140 10/20/2019 04:29 AM   TRIG 64 10/20/2019 04:29 AM   HDL 41 10/20/2019 04:29 AM   CHOLHDL 3.4 10/20/2019 04:29 AM   LDLCALC 86 10/20/2019 04:29 AM    Wt Readings from Last 3 Encounters:  02/25/20 113 lb (51.3 kg) (20 %, Z= -0.84)*  02/18/20 112 lb (50.8 kg) (18 %, Z= -0.91)*  01/21/20 114 lb (51.7 kg) (22 %, Z= -0.77)*   * Growth percentiles are based on CDC (Girls, 2-20 Years) data.     Objective:    Vital Signs:  There were no vitals taken for this visit.   ASSESSMENT & PLAN:    S/p PFO closure: pt is doing well with no new neurologic symptoms. She will continue on aspirin and plavix. She can discontinue plavix after 3 months ( 05/27/20). She understands the need for ongoing SBE prophylaxis x 6 months after closure (plans to defer dental work). I will see her back in 1 year with an echo/bubble and follow up.     COVID-19 Education: The signs and symptoms of COVID-19 were discussed with the patient and how to seek care for testing (follow up with PCP or arrange E-visit).  The importance of social distancing was discussed today.  Time:   Today, I have spent 11 minutes with the patient with telehealth technology discussing the above problems.     Medication Adjustments/Labs and Tests Ordered: Current medicines are reviewed at length with the patient today.  Concerns regarding medicines are outlined above.   Tests Ordered: No orders of the defined types were placed in this encounter.   Medication Changes: No orders of the defined types were placed in this encounter.    Disposition:  Follow up in 1 year   Signed, Cline Crock, Cordelia Poche  04/12/2020 11:52 AM    [facility]

## 2020-04-12 NOTE — Telephone Encounter (Signed)
Left message for patient to call back or respond to MyChart message to discuss switching today's visit from in-person to virtual.

## 2020-08-12 ENCOUNTER — Other Ambulatory Visit (HOSPITAL_COMMUNITY)
Admission: RE | Admit: 2020-08-12 | Discharge: 2020-08-12 | Disposition: A | Discharge status: Home / Self Care | Payer: Medicaid Other | Source: Ambulatory Visit | Attending: Obstetrics and Gynecology | Admitting: Obstetrics and Gynecology

## 2020-08-12 ENCOUNTER — Other Ambulatory Visit: Payer: Self-pay | Admitting: Obstetrics and Gynecology

## 2020-08-12 ENCOUNTER — Ambulatory Visit (INDEPENDENT_AMBULATORY_CARE_PROVIDER_SITE_OTHER): Payer: Medicaid Other | Admitting: Obstetrics and Gynecology

## 2020-08-12 ENCOUNTER — Other Ambulatory Visit: Payer: Self-pay

## 2020-08-12 ENCOUNTER — Encounter: Payer: Self-pay | Admitting: Obstetrics and Gynecology

## 2020-08-12 VITALS — BP 100/60 | Wt 109.0 lb

## 2020-08-12 DIAGNOSIS — O99411 Diseases of the circulatory system complicating pregnancy, first trimester: Secondary | ICD-10-CM

## 2020-08-12 DIAGNOSIS — Z369 Encounter for antenatal screening, unspecified: Secondary | ICD-10-CM

## 2020-08-12 DIAGNOSIS — Z113 Encounter for screening for infections with a predominantly sexual mode of transmission: Secondary | ICD-10-CM | POA: Diagnosis not present

## 2020-08-12 DIAGNOSIS — Z7185 Encounter for immunization safety counseling: Secondary | ICD-10-CM

## 2020-08-12 DIAGNOSIS — Z3A01 Less than 8 weeks gestation of pregnancy: Secondary | ICD-10-CM

## 2020-08-12 DIAGNOSIS — N926 Irregular menstruation, unspecified: Secondary | ICD-10-CM

## 2020-08-12 DIAGNOSIS — Z3401 Encounter for supervision of normal first pregnancy, first trimester: Secondary | ICD-10-CM

## 2020-08-12 LAB — OB RESULTS CONSOLE GC/CHLAMYDIA: Gonorrhea: NEGATIVE

## 2020-08-12 LAB — OB RESULTS CONSOLE VARICELLA ZOSTER ANTIBODY, IGG: Varicella: IMMUNE

## 2020-08-12 NOTE — Patient Instructions (Addendum)
For nausea (these may be purchased over-the-counter): -Vitamin B6 (pyridoxine):  25 mg three times each day (may buy 100 mg tablet and take twice per day or try to cut into 4 equal pieces and take 1 piece three times each day).  - doxylamine (found in [employer] and other sleep agents that can be bought in the store): take 12.5 - 25 mg (half tablet or whole tablet) at bedtime.  May take up to 25 mg three time each day.  However, keep in mind that this might make you sleepy.   First Trimester of Pregnancy The first trimester of pregnancy is from week 1 until the end of week 13 (months 1 through 3). A week after a sperm fertilizes an egg, the egg will implant on the wall of the uterus. This embryo will begin to develop into a baby. Genes from you and your partner will form the baby. The female genes will determine whether the baby will be a boy or a girl. At 6-8 weeks, the eyes and face will be formed, and the heartbeat can be seen on ultrasound. At the end of 12 weeks, all the baby's organs will be formed. Now that you are pregnant, you will want to do everything you can to have a healthy baby. Two of the most important things are to get good prenatal care and to follow your health care provider's instructions. Prenatal care is all the medical care you receive before the baby's birth. This care will help prevent, find, and treat any problems during the pregnancy and childbirth. Body changes during your first trimester Your body goes through many changes during pregnancy. The changes vary from woman to woman.  You may gain or lose a couple of pounds at first.  You may feel sick to your stomach (nauseous) and you may throw up (vomit). If the vomiting is uncontrollable, call your health care provider.  You may tire easily.  You may develop headaches that can be relieved by medicines. All medicines should be approved by your health care provider.  You may urinate more often. Painful urination may mean you  have a bladder infection.  You may develop heartburn as a result of your pregnancy.  You may develop constipation because certain hormones are causing the muscles that push stool through your intestines to slow down.  You may develop hemorrhoids or swollen veins (varicose veins).  Your breasts may begin to grow larger and become tender. Your nipples may stick out more, and the tissue that surrounds them (areola) may become darker.  Your gums may bleed and may be sensitive to brushing and flossing.  Dark spots or blotches (chloasma, mask of pregnancy) may develop on your face. This will likely fade after the baby is born.  Your menstrual periods will stop.  You may have a loss of appetite.  You may develop cravings for certain kinds of food.  You may have changes in your emotions from day to day, such as being excited to be pregnant or being concerned that something may go wrong with the pregnancy and baby.  You may have more vivid and strange dreams.  You may have changes in your hair. These can include thickening of your hair, rapid growth, and changes in texture. Some women also have hair loss during or after pregnancy, or hair that feels dry or thin. Your hair will most likely return to normal after your baby is born. What to expect at prenatal visits During a routine prenatal visit:  You will be weighed to make sure you and the baby are growing normally.  Your blood pressure will be taken.  Your abdomen will be measured to track your baby's growth.  The fetal heartbeat will be listened to between weeks 10 and 14 of your pregnancy.  Test results from any previous visits will be discussed. Your health care provider may ask you:  How you are feeling.  If you are feeling the baby move.  If you have had any abnormal symptoms, such as leaking fluid, bleeding, severe headaches, or abdominal cramping.  If you are using any tobacco products, including cigarettes, chewing  tobacco, and electronic cigarettes.  If you have any questions. Other tests that may be performed during your first trimester include:  Blood tests to find your blood type and to check for the presence of any previous infections. The tests will also be used to check for low iron levels (anemia) and protein on red blood cells (Rh antibodies). Depending on your risk factors, or if you previously had diabetes during pregnancy, you may have tests to check for high blood sugar that affects pregnant women (gestational diabetes).  Urine tests to check for infections, diabetes, or protein in the urine.  An ultrasound to confirm the proper growth and development of the baby.  Fetal screens for spinal cord problems (spina bifida) and Down syndrome.  HIV (human immunodeficiency virus) testing. Routine prenatal testing includes screening for HIV, unless you choose not to have this test.  You may need other tests to make sure you and the baby are doing well. Follow these instructions at home: Medicines  Follow your health care provider's instructions regarding medicine use. Specific medicines may be either safe or unsafe to take during pregnancy.  Take a prenatal vitamin that contains at least 600 micrograms (mcg) of folic acid.  If you develop constipation, try taking a stool softener if your health care provider approves. Eating and drinking   Eat a balanced diet that includes fresh fruits and vegetables, whole grains, good sources of protein such as meat, eggs, or tofu, and low-fat dairy. Your health care provider will help you determine the amount of weight gain that is right for you.  Avoid raw meat and uncooked cheese. These carry germs that can cause birth defects in the baby.  Eating four or five small meals rather than three large meals a day may help relieve nausea and vomiting. If you start to feel nauseous, eating a few soda crackers can be helpful. Drinking liquids between meals,  instead of during meals, also seems to help ease nausea and vomiting.  Limit foods that are high in fat and processed sugars, such as fried and sweet foods.  To prevent constipation: ? Eat foods that are high in fiber, such as fresh fruits and vegetables, whole grains, and beans. ? Drink enough fluid to keep your urine clear or pale yellow. Activity  Exercise only as directed by your health care provider. Most women can continue their usual exercise routine during pregnancy. Try to exercise for 30 minutes at least 5 days a week. Exercising will help you: ? Control your weight. ? Stay in shape. ? Be prepared for labor and delivery.  Experiencing pain or cramping in the lower abdomen or lower back is a good sign that you should stop exercising. Check with your health care provider before continuing with normal exercises.  Try to avoid standing for long periods of time. Move your legs often if you must  stand in one place for a long time.  Avoid heavy lifting.  Wear low-heeled shoes and practice good posture.  You may continue to have sex unless your health care provider tells you not to. Relieving pain and discomfort  Wear a good support bra to relieve breast tenderness.  Take warm sitz baths to soothe any pain or discomfort caused by hemorrhoids. Use hemorrhoid cream if your health care provider approves.  Rest with your legs elevated if you have leg cramps or low back pain.  If you develop varicose veins in your legs, wear support hose. Elevate your feet for 15 minutes, 3-4 times a day. Limit salt in your diet. Prenatal care  Schedule your prenatal visits by the twelfth week of pregnancy. They are usually scheduled monthly at first, then more often in the last 2 months before delivery.  Write down your questions. Take them to your prenatal visits.  Keep all your prenatal visits as told by your health care provider. This is important. Safety  Wear your seat belt at all times  when driving.  Make a list of emergency phone numbers, including numbers for family, friends, the hospital, and police and fire departments. General instructions  Ask your health care provider for a referral to a local prenatal education class. Begin classes no later than the beginning of month 6 of your pregnancy.  Ask for help if you have counseling or nutritional needs during pregnancy. Your health care provider can offer advice or refer you to specialists for help with various needs.  Do not use hot tubs, steam rooms, or saunas.  Do not douche or use tampons or scented sanitary pads.  Do not cross your legs for long periods of time.  Avoid cat litter boxes and soil used by cats. These carry germs that can cause birth defects in the baby and possibly loss of the fetus by miscarriage or stillbirth.  Avoid all smoking, herbs, alcohol, and medicines not prescribed by your health care provider. Chemicals in these products affect the formation and growth of the baby.  Do not use any products that contain nicotine or tobacco, such as cigarettes and e-cigarettes. If you need help quitting, ask your health care provider. You may receive counseling support and other resources to help you quit.  Schedule a dentist appointment. At home, brush your teeth with a soft toothbrush and be gentle when you floss. Contact a health care provider if:  You have dizziness.  You have mild pelvic cramps, pelvic pressure, or nagging pain in the abdominal area.  You have persistent nausea, vomiting, or diarrhea.  You have a bad smelling vaginal discharge.  You have pain when you urinate.  You notice increased swelling in your face, hands, legs, or ankles.  You are exposed to fifth disease or chickenpox.  You are exposed to [place] measles (rubella) and have never had it. Get help right away if:  You have a fever.  You are leaking fluid from your vagina.  You have spotting or bleeding from your  vagina.  You have severe abdominal cramping or pain.  You have rapid weight gain or loss.  You vomit blood or material that looks like coffee grounds.  You develop a severe headache.  You have shortness of breath.  You have any kind of trauma, such as from a fall or a car accident. Summary  The first trimester of pregnancy is from week 1 until the end of week 13 (months 1 through 3).  Your  body goes through many changes during pregnancy. The changes vary from woman to woman.  You will have routine prenatal visits. During those visits, your health care provider will examine you, discuss any test results you may have, and talk with you about how you are feeling. This information is not intended to replace advice given to you by your health care provider. Make sure you discuss any questions you have with your health care provider. Document Revised: 07/27/2017 Document Reviewed: 07/26/2016 Elsevier Patient Education  2020 [employer]   Pregnancy and Smoking Smoking during pregnancy is unhealthy for you and your baby. Smoke from cigarettes, e-cigarettes, pipes, and cigars contains many chemicals that can cause cancer (carcinogens). These products also contain a stimulant drug (nicotine). When you smoke, harmful substances that you breathe in enter your bloodstream and can be passed on to your baby. This can affect your baby's development. If you are planning to become pregnant or have recently become pregnant, talk with your health care provider about quitting smoking. You have a much better chance of having a healthy pregnancy and a healthy baby if you do not smoke while you are pregnant. How does smoking affect me? Smoking increases your risk for many long-term (chronic) diseases. These diseases include cancer, lung diseases, and heart disease. Smoking during pregnancy increases your risk of:  Losing the pregnancy (miscarriage or stillbirth).  Giving birth too early (premature  birth).  Pregnancy outside of the uterus (tubal pregnancy).  Problems with the placenta, which is the organ that provides the baby nourishment and oxygen. These problems may include: ? Attachment of the placenta over the opening of the uterus (placenta previa). ? Detachment of the placenta before the baby's birth (placental abruption).  Having your water break before labor begins. How does smoking affect my baby? Before birth Smoking during pregnancy:  Decreases blood flow and oxygen to your baby.  Increases your baby's risk of birth defects, such as heart defects.  Increases your baby's heart rate.  Slows your baby's growth in the uterus (intrauterine growth retardation). After birth Babies born to women who smoked during pregnancy may:  Have symptoms of nicotine withdrawal.  Be born with a cleft lip, cleft palate, or other facial deformities.  Be too small at birth.  Have a high risk of: ? Serious health problems or lifelong disabilities. These may result in the long-term need for certain medicines, therapies, or other treatments. ? Sudden infant death syndrome (SIDS). Follow these instructions at home:   Do not use any products that contain nicotine or tobacco, such as cigarettes, e-cigarettes, and chewing tobacco. If you need help quitting, ask your health care provider.  Talk with your health care provider about support strategies to quit smoking. Some methods to consider include: ? Counseling (smoking cessation counseling). ? Psychotherapy. ? Acupuncture. ? Hypnosis. ? Telephone hotlines for people trying to quit.  Do not take nicotine supplements or medicine to help you quit smoking unless your health care provider tells you to do so.  Avoid secondhand smoke. Ask people who smoke to avoid smoking around you.  Identify people, places, things, and activities that make you want to smoke (triggers). Avoid them. Where to find more information Learn more about  smoking during pregnancy and quitting smoking from:  March of Dimes: www.marchofdimes.org  U.S. Department of Health and Human Services: women.smokefree.gov  American Cancer Society: www.cancer.org  American Heart Association: www.heart.org  National Cancer Institute: www.cancer.gov For help to quit smoking:  National smoking cessation telephone hotline:  1-800-QUIT NOW (540-0867) Contact a health care provider if:  You are struggling to quit smoking.  You are a smoker and you become pregnant or plan to become pregnant.  You start smoking again after giving birth. Summary  Smoking during pregnancy is unhealthy for you and your baby.  Tobacco smoke contains harmful substances that can affect a baby's health and development.  Smoking increases the risk for serious problems, such as miscarriage, birth defects, or premature birth.  If you need help to quit smoking, talk to your health care provider and ask about support strategies such as counseling. This information is not intended to replace advice given to you by your health care provider. Make sure you discuss any questions you have with your health care provider. Document Revised: 03/14/2019 Document Reviewed: 03/14/2019 Elsevier Patient Education  2020 [employer].

## 2020-08-12 NOTE — Progress Notes (Signed)
New Obstetric Patient H&P    Chief Complaint: Positive home pregnancy test   History of Present Illness: Patient is a 19 y.o. G1P0 Not Hispanic or Latino female, presents with amenorrhea and positive home pregnancy test. Patient's last menstrual period was 07/02/2020. and based on her  LMP, her EDD is Estimated Date of Delivery: 04/08/21 and her EGA is [redacted]w[redacted]d. Cycles are 5. days, regular, and occur approximately every : 28 days. Patient has not had a pap smear as it is not indicated due to her age.   She had a urine pregnancy test which was positive 2 or 3 week(s)  ago. Her last menstrual period was normal and lasted for  4 or 5 day(s). Since her LMP she claims she has experienced increase nausea and breast tenderness. She denies vaginal bleeding. Her past medical history is contibutory. Patient was treated in 09/2019 for possible stroke vs complex migraine, during that admission patient had a negative hypercoag work-up, and was noted to have a PFO. Patient underwent PFO closure on 02/25/20 and has since completed her course of plavix and ASA. Patient scheduled to f/u with her cardiologist 01/2020. This is her first pregnancy.  Since her LMP, she admits to the use of tobacco products  Yes - quit vaping two days ago She claims she has gained   3 pounds since the start of her pregnancy.  There are cats in the home in the home  no  She admits close contact with children on a regular basis  no  She has had chicken pox in the past no She has had Tuberculosis exposures, symptoms, or previously tested positive for TB   no Current or past history of domestic violence. no  Genetic Screening/Teratology Counseling: (Includes patient, baby's father, or anyone in either family with:)   1. Patient's age >/= 57 at [facility]  no 2. Thalassemia ([place], [place], Mediterranean, or Asian background): MCV<80  no 3. Neural tube defect (meningomyelocele, spina bifida, anencephaly)  no 4. Congenital heart defect  yes  5.  Down syndrome  no 6. Tay-Sachs (Jewish, [place]  no 7. Canavan's Disease  no 8. Sickle cell disease or trait (African)  no  9. Hemophilia or other blood disorders  no  10. Muscular dystrophy  no  11. Cystic fibrosis  no  12. Huntington's Chorea  no  13. Mental retardation/autism  no 14. Other inherited genetic or chromosomal disorder  no 15. Maternal metabolic disorder (DM, PKU, etc)  no 16. Patient or FOB with a child with a birth defect not listed above no  16a. Patient or FOB with a birth defect themselves no 17. Recurrent pregnancy loss, or stillbirth  no  18. Any medications since LMP other than prenatal vitamins (include vitamins, supplements, OTC meds, drugs, alcohol)  no 19. Any other genetic/environmental exposure to discuss  no  Infection History:   1. Lives with someone with TB or TB exposed  no  2. Patient or partner has history of genital herpes  no 3. Rash or viral illness since LMP  no 4. History of STI (GC, CT, HPV, syphilis, HIV)  no 5. 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 Lives with boyfriend, Eliberto Ivory, and his family.     Review of Systems:10 point review of systems negative unless otherwise noted in HPI  Past Medical History:  Patient Active Problem List   Diagnosis Date Noted  .  TIA (transient ischemic attack)   . PFO (patent foramen ovale) 10/20/2019  . Numbness and tingling of right side of face 10/20/2019  . Right arm weakness 10/19/2019  . Numbness and tingling of right face 10/19/2019  . Expressive aphasia 10/19/2019  . Tobacco use disorder 10/19/2019    Past Surgical History:  Past Surgical History:  Procedure Laterality Date  . APPENDECTOMY    . LAPAROSCOPIC APPENDECTOMY N/A 01/28/2019   Procedure: APPENDECTOMY LAPAROSCOPIC;  Surgeon: Ancil Linsey, MD;  Location: [facility] ORS;  Service: General;  Laterality: N/A;  . PATENT FORAMEN  OVALE(PFO) CLOSURE N/A 02/25/2020   Procedure: PATENT FORAMEN OVALE (PFO) CLOSURE;  Surgeon: Tonny Bollman, MD;  Location: [facility] INVASIVE CV LAB;  Service: Cardiovascular;  Laterality: N/A;  . TEE WITHOUT CARDIOVERSION N/A 10/21/2019   Procedure: TRANSESOPHAGEAL ECHOCARDIOGRAM (TEE);  Surgeon: [name], MD;  Location: [facility] ORS;  Service: Cardiovascular;  Laterality: N/A;    Gynecologic History: Patient's last menstrual period was 07/02/2020.  Obstetric History: G1P0  Family History:  Family History  Problem Relation Age of Onset  . Hypertension Mother   . Hyperlipidemia Mother   . Cancer Maternal Grandmother   . Breast cancer Maternal Grandmother 60  . Cancer Maternal Grandfather   . Colon cancer Maternal Grandfather 77    Social History:  Social History   Socioeconomic History  . Marital status: Married    Spouse name: Not on file  . Number of children: Not on file  . Years of education: Not on file  . Highest education level: Not on file  Occupational History  . Not on file  Tobacco Use  . Smoking status: Former Smoker    Quit date: 11/24/2019    Years since quitting: 0.7  . Smokeless tobacco: Never Used  Vaping Use  . Vaping Use: Never used  Substance and Sexual Activity  . Alcohol use: Not Currently  . Drug use: Not Currently    Types: Marijuana  . Sexual activity: Yes    Birth control/protection: None  Other Topics Concern  . Not on file  Social History Narrative  . Not on file   Social Determinants of Health   Financial Resource Strain: Not on file  Food Insecurity: Not on file  Transportation Needs: Not on file  Physical Activity: Not on file  Stress: Not on file  Social Connections: Not on file  Intimate Partner Violence: Not on file    Allergies:  No Known Allergies  Medications: Prior to Admission medications   Medication Sig Start Date End Date Taking? Authorizing Provider  acetaminophen (TYLENOL) 325 MG tablet Take 650 mg by mouth  every 6 (six) hours as needed for mild pain or headache.    Yes [provider]  aspirin EC 81 MG tablet Take 1 tablet (81 mg total) by mouth daily. Swallow whole. 02/18/20  Yes Janetta Hora, PA-C  Multiple Vitamins-Minerals (WOMENS MULTIVITAMIN) TABS Take 1 tablet by mouth daily.    Yes [provider]  Prenatal Vit-Fe Fumarate-FA (MULTIVITAMIN-PRENATAL) 27-0.8 MG TABS tablet Take 1 tablet by mouth daily at 12 noon.   Yes [provider]    Physical Exam Vitals: Blood pressure 100/60, weight 109 lb (49.4 kg), last menstrual period 07/02/2020.  General: NAD HEENT: normocephalic, anicteric Thyroid: no enlargement, no palpable nodules Pulmonary: No increased work of breathing, CTAB Cardiovascular: RRR, distal pulses 2+ Abdomen: NABS, soft, non-tender, non-distended.  Umbilicus without lesions.  No hepatomegaly, splenomegaly or masses palpable. No evidence of hernia  Genitourinary:  External: Normal external female genitalia.  Normal urethral meatus, normal  Bartholin's and Skene's glands.    Vagina: Normal vaginal mucosa, no evidence of prolapse.    Cervix: Grossly normal in appearance, no bleeding  Uterus: Enlarged (size consistent with dates, 6 wks), mobile, normal contour.  No CMT  Adnexa: ovaries non-enlarged, no adnexal masses  Rectal: deferred Extremities: no edema, erythema, or tenderness Neurologic: Grossly intact Psychiatric: mood appropriate, affect full   Assessment: 19 y.o. G1P0 at [redacted]w[redacted]d presenting to initiate prenatal care  Plan: 1) Avoid alcoholic beverages. 2) Patient encouraged not to smoke. Praised for smoking cessation. 3) Discontinue the use of all non-medicinal drugs and chemicals.  4) Take prenatal vitamins daily.  5) Nutrition, food safety (fish, cheese advisories, and high nitrite foods) and exercise discussed. 6) Hospital and practice style discussed with cross coverage system.  7) Genetic Screening, such as with 1st Trimester  Screening, cell free fetal DNA, AFP testing, and Ultrasound, as well as with amniocentesis and CVS as appropriate, is discussed with patient. At the conclusion of today's visit patient requested genetic testing 8) Hx of recent PFO closure - recommend f/u with cardiologist in context of pregnancy 9) Hx of complex migraine vs stroke with negative coag work-up - referred to MFM due to recent hx 10) Reviewed plan of care with Dr. Jean Rosenthal 11) NOB labs today, urine UDS/cx, GC/CT 12) RTC in 2 weeks for dating [place] and ROB  Zipporah Plants, CNM, MSN [facility], [facility] 08/12/2020, 2:48 PM

## 2020-08-13 LAB — RPR+RH+ABO+RUB AB+AB SCR+CB...
Antibody Screen: NEGATIVE
HIV Screen 4th Generation wRfx: NONREACTIVE
Hematocrit: 42.4 % (ref 34.0–46.6)
Hemoglobin: 14.3 g/dL (ref 11.1–15.9)
Hepatitis B Surface Ag: NEGATIVE
MCH: 28.9 pg (ref 26.6–33.0)
MCHC: 33.7 g/dL (ref 31.5–35.7)
MCV: 86 fL (ref 79–97)
Platelets: 264 10*3/uL (ref 150–450)
RBC: 4.95 x10E6/uL (ref 3.77–5.28)
RDW: 12.5 % (ref 11.7–15.4)
RPR Ser Ql: NONREACTIVE
Rh Factor: POSITIVE
Rubella Antibodies, IGG: 1.76 index (ref >0.99)
Varicella zoster IgG: 626 index (ref >165)
WBC: 9.8 10*3/uL (ref 3.4–10.8)

## 2020-08-13 LAB — CERVICOVAGINAL ANCILLARY ONLY
Chlamydia: NEGATIVE
Comment: NEGATIVE
Comment: NORMAL
Neisseria Gonorrhea: NEGATIVE

## 2020-08-15 LAB — URINE CULTURE

## 2020-08-17 NOTE — Telephone Encounter (Signed)
R/S for 09/03/19 @1  for u/s followed by MFM Consult. Pt to arrive at 12:45. LMVM to notify pt.

## 2020-08-19 ENCOUNTER — Ambulatory Visit: Payer: Medicaid Other

## 2020-08-25 ENCOUNTER — Other Ambulatory Visit: Payer: Self-pay

## 2020-08-25 ENCOUNTER — Ambulatory Visit (INDEPENDENT_AMBULATORY_CARE_PROVIDER_SITE_OTHER): Payer: Medicaid Other | Admitting: Obstetrics and Gynecology

## 2020-08-25 ENCOUNTER — Other Ambulatory Visit: Payer: Self-pay | Admitting: Obstetrics and Gynecology

## 2020-08-25 ENCOUNTER — Encounter: Payer: Self-pay | Admitting: Obstetrics and Gynecology

## 2020-08-25 ENCOUNTER — Ambulatory Visit (INDEPENDENT_AMBULATORY_CARE_PROVIDER_SITE_OTHER): Payer: Medicaid Other

## 2020-08-25 VITALS — BP 90/60 | Wt 110.0 lb

## 2020-08-25 DIAGNOSIS — O0990 Supervision of high risk pregnancy, unspecified, unspecified trimester: Secondary | ICD-10-CM | POA: Insufficient documentation

## 2020-08-25 DIAGNOSIS — Z3401 Encounter for supervision of normal first pregnancy, first trimester: Secondary | ICD-10-CM

## 2020-08-25 DIAGNOSIS — Z3A01 Less than 8 weeks gestation of pregnancy: Secondary | ICD-10-CM

## 2020-08-25 DIAGNOSIS — N926 Irregular menstruation, unspecified: Secondary | ICD-10-CM | POA: Diagnosis not present

## 2020-08-25 DIAGNOSIS — O0991 Supervision of high risk pregnancy, unspecified, first trimester: Secondary | ICD-10-CM | POA: Insufficient documentation

## 2020-08-25 LAB — POCT URINALYSIS DIPSTICK OB
Glucose, UA: NEGATIVE
POC,PROTEIN,UA: NEGATIVE

## 2020-08-25 NOTE — Progress Notes (Signed)
Wants to find out conception day.  Pt was shown pregnancy wheel which she took a picture of.rj

## 2020-08-25 NOTE — Progress Notes (Signed)
    Routine Prenatal Care Visit  Subjective  Candice Villarreal is a 19 y.o. G1P0 at [redacted]w[redacted]d being seen today for ongoing prenatal care.  She is currently monitored for the following issues for this low-risk pregnancy and has Tobacco use disorder; PFO (patent foramen ovale, s/p closure); Numbness and tingling of right side of face; TIA (transient ischemic attack); and Encounter for supervision of normal first pregnancy in first trimester on their problem list.  ----------------------------------------------------------------------------------- Patient reports occasional nausea - some improvement with current meds.  Patient with questions and concerns regarding establishing paternity.  . Vag. Bleeding: None.   . Denies leaking of fluid.  ----------------------------------------------------------------------------------- The following portions of the patient's history were reviewed and updated as appropriate: allergies, current medications, past family history, past medical history, past social history, past surgical history and problem list. Problem list updated.   Objective  Last menstrual period 07/02/2020. Pregravid weight 106 lb (48.1 kg) Total Weight Gain 4 lb (1.814 kg) Urinalysis:      Fetal Status: Fetal Heart Rate (bpm): 160 ([place])         General:  Alert, oriented and cooperative. Patient is in no acute distress.  Skin: Skin is warm and dry. No rash noted.   Cardiovascular: Normal heart rate noted  Respiratory: Normal respiratory effort, no problems with respiration noted  Abdomen: Soft, gravid, appropriate for gestational age. Pain/Pressure: Absent     Pelvic:  Cervical exam deferred        Extremities: Normal range of motion.     ental Status: Normal mood and affect. Normal behavior. Normal judgment and thought content.     Assessment   19 y.o. G1P0 at [redacted]w[redacted]d by  04/08/2021, by Last Menstrual Period presenting for routine prenatal visit  Plan   pregnancy1 Problems (from 07/02/20  to present)    Problem Noted Resolved   Encounter for supervision of normal first pregnancy in first trimester 08/25/2020 by Zipporah Plants, CNM No   Overview Signed 08/25/2020  3:31 PM by Zipporah Plants, CNM    Clinic [facility] Prenatal Labs  Dating  LMP = 7w [place] Blood type: O/Positive/-- (12/16 1125)   Genetic Screen   NIPS: Antibody:Negative (12/16 1125)  Anatomic [place]  Rubella: 1.76 (12/16 1125) Varicella: immune  GTT  Third trimester:  RPR: Non Reactive (12/16 1125)   Rhogam  n/a HBsAg: Negative (12/16 1125)   TDaP vaccine                       Flu Shot: HIV: Non Reactive (12/16 1125)   Baby Food                                GBS:   Contraception  Pap: n/a (<21 yo)  CBB     CS/VBAC    Support Person                -Keep previously schedule MFM appointment -Reviewed dating US findings - confirmed EDD established by LMP  Gestational age appropriate obstetric precautions including but not limited to vaginal bleeding, contractions, leaking of fluid and fetal movement were reviewed in detail with the patient.    Return in about 4 weeks (around 09/22/2020) for ROB.  Zipporah Plants, CNM, MSN [facility], [facility] 08/25/2020, 3:48 PM

## 2020-08-28 NOTE — L&D Delivery Note (Signed)
Delivery Note Called to the room as patient has strong rectal pressure, and vertex seen near the perineum.  At  0603,a viable female was delivered vaginally over an intact perineum. The infant presented OA with restitution to ROT.  Thick meconium seen throughout the second  stage. Nuchal cord x one recued on the perineum. The anterioir should was delvered with downward  guidance, and then, with upward guidance, the posterior should appeared. The infant was immediately placed on the maternal abdomen, and required tactile stimulation and drying and the use of the bulb syringe to elicit respirations. (See Nursery record) APGAR: 39, ; weight  6lbs 11 oz..   Placenta status:  , intact, delivered at [phone] .  Cord: 3 vessel  with the following complications:  .  Cord pH: not done  Anesthesia:  epidural Episiotomy:  none Lacerations:  none Suture Repair:  NA Est. Blood Loss (mL):    Mom to postpartum.  Baby to Couplet care / Skin to Skin.  Mirna Mires 04/07/2021, 6:50 AM

## 2020-08-29 LAB — URINE DRUG PANEL 7
Amphetamines, Urine: NEGATIVE ng/mL
Barbiturate Quant, Ur: NEGATIVE ng/mL
Benzodiazepine Quant, Ur: NEGATIVE ng/mL
Cannabinoid Quant, Ur: NEGATIVE
Cocaine (Metab.): NEGATIVE ng/mL
Opiate Quant, Ur: NEGATIVE ng/mL
PCP Quant, Ur: NEGATIVE ng/mL

## 2020-09-02 ENCOUNTER — Ambulatory Visit (HOSPITAL_BASED_OUTPATIENT_CLINIC_OR_DEPARTMENT_OTHER): Payer: Medicaid Other | Admitting: Obstetrics and Gynecology

## 2020-09-02 ENCOUNTER — Other Ambulatory Visit: Payer: Self-pay

## 2020-09-02 ENCOUNTER — Other Ambulatory Visit
Admission: RE | Admit: 2020-09-02 | Discharge: 2020-09-02 | Disposition: A | Discharge status: Home / Self Care | Payer: Medicaid Other | Attending: Obstetrics and Gynecology | Admitting: Obstetrics and Gynecology

## 2020-09-02 ENCOUNTER — Ambulatory Visit: Payer: Medicaid Other | Attending: Obstetrics and Gynecology

## 2020-09-02 DIAGNOSIS — O0991 Supervision of high risk pregnancy, unspecified, first trimester: Secondary | ICD-10-CM | POA: Diagnosis not present

## 2020-09-02 DIAGNOSIS — O99891 Other specified diseases and conditions complicating pregnancy: Secondary | ICD-10-CM

## 2020-09-02 DIAGNOSIS — Z3A08 8 weeks gestation of pregnancy: Secondary | ICD-10-CM | POA: Insufficient documentation

## 2020-09-02 DIAGNOSIS — O99411 Diseases of the circulatory system complicating pregnancy, first trimester: Secondary | ICD-10-CM | POA: Insufficient documentation

## 2020-09-02 DIAGNOSIS — Q211 Atrial septal defect: Secondary | ICD-10-CM

## 2020-09-02 DIAGNOSIS — Z3401 Encounter for supervision of normal first pregnancy, first trimester: Secondary | ICD-10-CM

## 2020-09-02 DIAGNOSIS — Q2112 Patent foramen ovale: Secondary | ICD-10-CM

## 2020-09-02 DIAGNOSIS — Z8673 Personal history of transient ischemic attack (TIA), and cerebral infarction without residual deficits: Secondary | ICD-10-CM

## 2020-09-02 DIAGNOSIS — Z8774 Personal history of (corrected) congenital malformations of heart and circulatory system: Secondary | ICD-10-CM

## 2020-09-02 DIAGNOSIS — I519 Heart disease, unspecified: Secondary | ICD-10-CM | POA: Insufficient documentation

## 2020-09-02 NOTE — Progress Notes (Signed)
Maternal-Fetal Medicine   Name: Candice Villarreal DOB: Dec 11, 2000 MRN: [id number] Referring Provider: Zipporah Plants, MD  I had the pleasure of seeing Ms. Candice Villarreal today at the Maternal Fetal Care, [facility]. She is G1 P0 at 8-weeks' gestation and is here for consultation. In June 2021, she had endovascular closure of patent foramen ovale (PFO).   In Feb 2021, patient had numbness on right side of her face and difficulty to move her right hand. She had intermittent episodes of motor weakness and was evaluated at the ED. After hospital admission, her symptoms improved, and she was given heparin. Patient was discharged the same day (not on heparin).  She had cardiology evaluation as an outpatient. Echocardiography raised the possibility of PFO.   Patient was counseled by her cardiologist and she underwent endovascular closure in June 2021 by her cardiologist, Dr. Excell Seltzer. Postoperative recovery was uneventful. Apparently, she took heparin prophylaxis for 3 months. She reports she did not take bacterial endocarditis prophylaxis.  No history of hypertension or diabetes or any other chronic medical conditions.  PSH: PFO closure, appendicitis. Medications: Prenatal vitamins, aspirin (discontinued). Allergies: NKDA. Social: Denies tobacco or drug or alcohol use. She is single and her partner is Caucasian, who is in good health. Family: No history of congenital heart defects or venous thromboembolism in the family.  Gyn history: Previous cycles were reportedly regular. No history of abnormal Pap smear or cervical surgeries. No history of breast disease.  Ultrasound: A single intrauterine pregnancy is seen. The CRL measurement is consistent with her previously established (early ultrasound) dates and good fetal heart activity is seen.  Our concerns include: Patent Foramen Ovale (PFO) -PFO is a common condition and is seen in about 20% to 30% of all adults. More than 150,000 people in the [place] have  strokes related to this condition and it is the cause of about 50% of all "cryptogenic" strokes (unknown cause or mechanism). -Pregnancy increases the risk of venous thromboembolism (VTE) about 5-fold (and embolism via PFO) and there is 3-fold increase in ischemic and hemorrhagic stroke in pregnant women (and in the postpartum period).  -Clotting factors I, II, VII, VIII, IX, and X are increased in pregnancy. Venous stasis is a contributing factor to VTE in addition to the hypercoagulable state seen in pregnancy. -Other risk factors include smoking, use of oral contraceptive, the presence of large atrial septal aneurysm (ASA), and pulmonary arteriovenous malformations.  -Acquired and inherited thrombophilia increase the risk of stroke.  -Literature review shows about 60% of PFO-related strokes occur in the first two trimesters of pregnancy. Patients are also at increased risk in the postpartum period.  -Recurrent strokes in these patients are not uncommon.  -Prevention of deep vein thrombosis (more common in pregnancy) reduces the likelihood of stroke.  -I recommend heparin prophylaxis in this pregnancy (low-molecular weight heparin) to prevent the likelihood of VTE and stroke.  -Mode of delivery: It is unclear whether Valsalva during second stage increases the risk of right-to-left shunting of PFO. Our patient had repaired PFO and the likelihood of shunting seems small. Elective cesarean delivery need not be performed and vaginal delivery is reasonable.  PFO Treatment -Our patient had endovascular closure on 02/25/20 by our cardiologist, Dr. Excell Seltzer. On preop echocardiograph, the finding raised the possibility but not confirmatory of PFO. The patient did not have atrial septal aneurysm. Transesophageal echo suggested possible PFO. Endovascular closure was successful. Patient took heparin prophylaxis for 3 months after the procedure and she reports she did not take SBE  prophylaxis (antibiotics). I  counseled the patient on PFO in pregnancy. Closure of PFO reduces the risk but not eliminate the risk of recurrence of stroke (RESPECT Trial). As pregnancy is associated with increased risk of VTE, I recommended heparin prophylaxis in this pregnancy from now till 6 weeks postpartum. Side-effects are rare but include rashes, thrombocytopenia. She can continue low-dose aspirin with heparin.  Patient is reluctant to take heparin but will consider that recommendation. We ordered thrombophilia work-up today and will communicate the results to the patient. We will perform detailed fetal anatomy scan at 18 weeks and will set up appointment for fetal echocardiography. Patient had questions on paternity testing but could not stay to discuss with our genetic counselor. She will discuss with our genetic counselor at a later date on phone.  Recommendations: -An appointment was made for her to return in 10 weeks for fetal anatomy scan. -Fetal echocardiography at 20 weeks. -Low-molecular weight heparin 40 mg subcutaneously daily from now till 6 weeks postpartum. Patient was encouraged on the benefit of taking heparin prophylaxis. -Continue low-dose aspirin. If patient is reluctant to take aspirin now, she can resume from 12 weeks' gestation. -Consider vaginal delivery. -Follow-up with cardiologist at least once in this pregnancy.  Labs drawn today:  -Anticardiolipin antibodies IgG and IgM -Lupus anticoagulant antibodies (dRVVT) -Anti-beta2 glycoprotein I antibodies IgG and IgM -Factor V Leiden  -Prothrombin G mutation -Antithrombin III levels -We will communicate the results to the patient.  Thank you for consultation. If you have any questions or concerns, please contact me at the Maternal Fetal Care, [facility]. Consultation including face-to-face counseling 45 minutes.

## 2020-09-03 ENCOUNTER — Other Ambulatory Visit: Payer: Self-pay | Admitting: Obstetrics and Gynecology

## 2020-09-03 ENCOUNTER — Encounter: Payer: Self-pay | Admitting: Obstetrics and Gynecology

## 2020-09-03 LAB — ANTITHROMBIN PANEL
AT III AG PPP IMM-ACNC: 73 % (ref 72–124)
Antithrombin Activity: 98 % (ref 75–135)

## 2020-09-03 LAB — LUPUS ANTICOAGULANT PANEL
DRVVT: 29.7 s (ref 0.0–47.0)
PTT Lupus Anticoagulant: 34.8 s (ref 0.0–51.9)

## 2020-09-04 LAB — CARDIOLIPIN ANTIBODIES, IGM+IGG
Anticardiolipin IgG: <9 GPL U/mL (ref 0–14)
Anticardiolipin IgM: <9 MPL U/mL (ref 0–12)

## 2020-09-04 LAB — BETA-2-GLYCOPROTEIN I ABS, IGG/M/A
Beta-2 Glyco I IgG: 12 GPI IgG units (ref 0–20)
Beta-2-Glycoprotein I IgA: <9 GPI IgA units (ref 0–25)
Beta-2-Glycoprotein I IgM: <9 GPI IgM units (ref 0–32)

## 2020-09-07 ENCOUNTER — Telehealth: Payer: Self-pay | Admitting: Obstetrics and Gynecology

## 2020-09-07 NOTE — Telephone Encounter (Signed)
I spoke with [name] to review the results of labs drawn by Dr. Judeth Cornfield on 09/02/2020.  The labs were reviewed by Dr. Judeth Cornfield and are within normal limits.  Specifically, these are the anticardiolipin, lupus anticoagulant, antithrombin III and beta-2 glycoprotein results.  The testing for the Prothrombin gene mutation and Factor V Leiden are still pending and will be called to the patient as soon as they become available.  The patient also inquired about paternity testing for this pregnancy.  I reviewed with her that his is technically possible, but would require invasive testing through [facility] or amniocentesis.  We spoke about the procedures, risks and cost of these tests, as this would not be an indication covered by insurance.  In addition, we made it clear that these results would not be legally binding.  She was also given written information about paternity testing options after birth.  We are happy to speak further as needed and can be reached at [phone].  Candice Anderson, MS, CGC

## 2020-09-08 ENCOUNTER — Telehealth: Payer: Self-pay | Admitting: Obstetrics and Gynecology

## 2020-09-08 LAB — PROTHROMBIN GENE MUTATION

## 2020-09-08 LAB — FACTOR 5 LEIDEN

## 2020-09-08 NOTE — Telephone Encounter (Signed)
Pt is [redacted] wks pregnant with sore throat, congestion, ear pain, low grade fever, started 2 days ago. Getting ready to take home covid test. Supportive tx with sudafed, tylenol, rest, fluids. Go to ED if has SOB. Isolation/masking. Didn't have vaccine.

## 2020-09-09 ENCOUNTER — Telehealth: Payer: Self-pay | Admitting: Obstetrics and Gynecology

## 2020-09-09 NOTE — Telephone Encounter (Signed)
I spoke with Ms. Scarola to let her know the results of the Prothrombin Gene mutation testing and the Factor V Leiden testing which were ordered by Dr. Judeth Cornfield on 09/02/2020.  These results were negative for the gene changes evaluated, which means that there is no indication for an increased risk for thrombosis due to these genetic variants.    In addition, the patient asked about non-invasive paternity testing.  Though I have not personally ordered NIPP for my patients, we reviewed the technology involved in this testing. NIPP requires a blood sample from a mother and a cheek swab from a possible father. In the mother's blood, there is cell-free DNA from both the mother and the placenta (which is most often representative of the fetus). I believe NIPP labs use a SNP-based technology to compare the placental DNA to the maternal and possible paternal DNA. SNPs are harmless genetic changes that don't impact health but make [place] all unique from one another. If the placental DNA does not contain information from the paternal sample in its SNP profile, paternity would not be expected. As far as I can tell, most NIPP labs claim ~99% sensitivity. I would recommend that she pursue a NIPP test that is accredited by the AABB and recommended by the American Pregnancy Association - I think the one offered through [facility] is one of the options considered to be fairly reliable. If a patient is pregnant with twins or if possible fathers are first degree relatives to one another, NIPP will not work/be accurate. Also, NIPP is not considered court-admissible, so that's another thing to consider. I generally recommend waiting until the postnatal period to pursue paternity testing for these reasons; however, I know that many people are interested in pursuing NIPP for an earlier answer.   Ms. Marrin was instructed to contact the paternity testing lab directly regarding sample collection, cost and results disclosure, as  this is not part of the services we would provide here in our clinic.  If she were to elect invasive prenatal testing through amniocentesis or [facility], we will facilitate the sample collection and getting it to a lab, but we are not involved in paternity test results disclosure.  We can be reached at [phone] with any questions or concerns.  Cherly Anderson, MS, CGC

## 2020-09-22 ENCOUNTER — Other Ambulatory Visit: Payer: Self-pay

## 2020-09-22 ENCOUNTER — Ambulatory Visit (INDEPENDENT_AMBULATORY_CARE_PROVIDER_SITE_OTHER): Payer: Medicaid Other | Admitting: Obstetrics

## 2020-09-22 VITALS — BP 90/60 | Wt 108.0 lb

## 2020-09-22 DIAGNOSIS — Z3A11 11 weeks gestation of pregnancy: Secondary | ICD-10-CM

## 2020-09-22 DIAGNOSIS — Z3401 Encounter for supervision of normal first pregnancy, first trimester: Secondary | ICD-10-CM

## 2020-09-22 LAB — POCT URINALYSIS DIPSTICK OB
Glucose, UA: NEGATIVE
POC,PROTEIN,UA: NEGATIVE

## 2020-09-22 NOTE — Progress Notes (Signed)
C/o per pt MFM drew blood; it was normal; they said she did not need the heparin.rj

## 2020-09-22 NOTE — Progress Notes (Signed)
Routine Prenatal Care Visit  Subjective  Candice Villarreal is a 20 y.o. G1P0 at [redacted]w[redacted]d being seen today for ongoing prenatal care.  She is currently monitored for the following issues for this high-risk pregnancy and has Tobacco use disorder; PFO (patent foramen ovale); Numbness and tingling of right side of face; TIA (transient ischemic attack); and Supervision of high risk pregnancy, antepartum, first trimester on their problem list.  ----------------------------------------------------------------------------------- Patient reports no complaints.    .  .   Pincus Large Fluid denies.  ----------------------------------------------------------------------------------- The following portions of the patient's history were reviewed and updated as appropriate: allergies, current medications, past family history, past medical history, past social history, past surgical history and problem list. Problem list updated.  Objective  Last menstrual period 07/02/2020. Pregravid weight 106 lb (48.1 kg) Total Weight Gain 2 lb (0.907 kg) Urinalysis: Urine Protein Negative  Urine Glucose Negative  Fetal Status:           General:  Alert, oriented and cooperative. Patient is in no acute distress.  Skin: Skin is warm and dry. No rash noted.   Cardiovascular: Normal heart rate noted  Respiratory: Normal respiratory effort, no problems with respiration noted  Abdomen: Soft, gravid, appropriate for gestational age.       Pelvic:  Cervical exam deferred        Extremities: Normal range of motion.     Mental Status: Normal mood and affect. Normal behavior. Normal judgment and thought content.   Assessment   19 y.o. G1P0 at [redacted]w[redacted]d by  04/08/2021, by Last Menstrual Period presenting for routine prenatal visit  Plan   pregnancy1 Problems (from 07/02/20 to present)    Problem Noted Resolved   Supervision of high risk pregnancy, antepartum, first trimester 08/25/2020 by Zipporah Plants, CNM No   Overview Addendum  09/03/2020 11:17 AM by Zipporah Plants, CNM    [facility]inic [facility] Prenatal Labs  Dating  LMP = 7w [place] Blood type: O/Positive/-- (12/16 1125)   Genetic Screen   NIPS: Antibody:Negative (12/16 1125)  Anatomic [place]  Rubella: 1.76 (12/16 1125) Varicella: immune  GTT  Third trimester:  RPR: Non Reactive (12/16 1125)   Rhogam  n/a HBsAg: Negative (12/16 1125)   TDaP vaccine                       Flu Shot: HIV: Non Reactive (12/16 1125)   Baby Food                                GBS:   Contraception  Pap: n/a (<21 yo)  CBB     CS/VBAC    Support Person     Recommendations from 8 week visit with MFM regarding hx of PFO:  -An appointment was made for her to return in 10 weeks for  fetal anatomy scan.  -Fetal echocardiography at 20 weeks.  -Low-molecular weight heparin 40 mg subcutaneously daily  from now till 6 weeks postpartum. Patient was encouraged on  the benefit of taking heparin prophylaxis.  -Continue low-dose aspirin. If patient is reluctant to take  aspirin now, she can resume from 12 weeks' gestation.  -Consider vaginal delivery.  -Follow-up with cardiologist at least once in this pregnancy.      Previous Version       Preterm labor symptoms and general obstetric precautions including but not limited to vaginal bleeding, contractions, leaking of fluid and fetal movement were reviewed  in detail with the patient. Please refer to After Visit Summary for other counseling recommendations.  She shares that per MFM, she does not have to be on Heparin, but could take a daily ASA. Phone voicemail left at MFM office inquiring about this, and MFM asked to contact our office to clarify.  Encouraged to learn about Labor and birth by watching Charlyn Minerva, reading pregnancy books and considering the virtual CBE class offered by [facility].  Mirna Mires, CNM  09/22/2020 2:33 PM    No follow-ups on file.  Mirna Mires, CNM  09/22/2020 2:33 PM

## 2020-09-23 ENCOUNTER — Telehealth: Payer: Self-pay | Admitting: Obstetrics and Gynecology

## 2020-09-24 ENCOUNTER — Other Ambulatory Visit: Payer: Self-pay | Admitting: Obstetrics

## 2020-09-24 DIAGNOSIS — Z8673 Personal history of transient ischemic attack (TIA), and cerebral infarction without residual deficits: Secondary | ICD-10-CM

## 2020-09-24 DIAGNOSIS — Z3401 Encounter for supervision of normal first pregnancy, first trimester: Secondary | ICD-10-CM

## 2020-09-24 MED ORDER — ENOXAPARIN SODIUM 40 MG/0.4ML ~~LOC~~ SOLN: 40.0000 mg | SUBCUTANEOUS | 6 refills | Status: DC

## 2020-09-24 NOTE — Telephone Encounter (Signed)
I called Candice Villarreal today and reassured her of negative thrombophilia work-up. Patient was already informed of the results earlier. I emphasized the importance of taking prophylactic heparin (lovenox) throughout her pregnancy. Message sent to Paula Compton through Epic. Noralee Space

## 2020-09-24 NOTE — Progress Notes (Signed)
Left voicemail and sent a letter to patient stressing the import of her starting on the Lovenox.  At her last OB visit, Candice Villarreal shared her impression, that,  due to negative lab testing, she could take a daily ASA instead.  I have ordered the Lovenox, and informed Candice Villarreal that seh can make an appointment with one of the office Mas to learne how to administer the medication.  Mirna Mires, CNM  09/24/2020 9:44 AM

## 2020-10-20 ENCOUNTER — Encounter: Payer: Self-pay | Admitting: Obstetrics & Gynecology

## 2020-10-20 ENCOUNTER — Ambulatory Visit (INDEPENDENT_AMBULATORY_CARE_PROVIDER_SITE_OTHER): Payer: Medicaid Other | Admitting: Obstetrics & Gynecology

## 2020-10-20 ENCOUNTER — Other Ambulatory Visit: Payer: Self-pay

## 2020-10-20 VITALS — BP 100/60 | Wt 114.0 lb

## 2020-10-20 DIAGNOSIS — Z3A15 15 weeks gestation of pregnancy: Secondary | ICD-10-CM

## 2020-10-20 DIAGNOSIS — Z3402 Encounter for supervision of normal first pregnancy, second trimester: Secondary | ICD-10-CM

## 2020-10-20 DIAGNOSIS — Z1379 Encounter for other screening for genetic and chromosomal anomalies: Secondary | ICD-10-CM

## 2020-10-20 DIAGNOSIS — O0991 Supervision of high risk pregnancy, unspecified, first trimester: Secondary | ICD-10-CM

## 2020-10-20 NOTE — Progress Notes (Signed)
  Subjective  Fetal Movement? yes Leaking Fluid? no Vaginal Bleeding? no Nausea? no Objective  BP 100/60   Wt 114 lb (51.7 kg)   LMP 07/02/2020   BMI 20.85 kg/m  General: NAD Pumonary: no increased work of breathing Abdomen: gravid, non-tender Extremities: no edema Psychiatric: mood appropriate, affect full  Assessment  19 y.o. G1P0 at [redacted]w[redacted]d by  04/08/2021, by Last Menstrual Period presenting for routine prenatal visit  Plan   Problem List Items Addressed This Visit   None   Visit Diagnoses    [redacted] weeks gestation of pregnancy    -  Primary   Encounter for supervision of normal first pregnancy in second trimester       Encounter for genetic screening for Down Syndrome       Relevant Orders   MaterniT21 PLUS Core+SCA      pregnancy1 Problems (from 07/02/20 to present)    Problem Noted Resolved   Supervision of high risk pregnancy, antepartum, first trimester 08/25/2020 by Zipporah Plants, CNM No   Overview Addendum 09/03/2020 11:17 AM by Zipporah Plants, CNM    [facility]inic [facility] Prenatal Labs  Dating  LMP = 7w [place] Blood type: O/Positive/-- (12/16 1125)   Genetic Screen   NIPS: Antibody:Negative (12/16 1125)  Anatomic [place]  Rubella: 1.76 (12/16 1125) Varicella: immune  GTT  Third trimester:  RPR: Non Reactive (12/16 1125)   Rhogam  n/a HBsAg: Negative (12/16 1125)   TDaP vaccine                       Flu Shot: HIV: Non Reactive (12/16 1125)   Baby Food                                GBS:   Contraception  Pap: n/a (<21 yo)  CBB     CS/VBAC    Support Person     Recommendations from 8 week visit with MFM regarding hx of PFO:  -An appointment was made for her to return in 10 weeks for  fetal anatomy scan.  -Fetal echocardiography at 20 weeks.  -Low-molecular weight heparin 40 mg subcutaneously daily  from now till 6 weeks postpartum. Patient was encouraged on  the benefit of taking heparin prophylaxis.  10/20/20- Pt refuses to take the Lovenox and expressess understanding but  lack of concern for blood clot risk  -Continue low-dose aspirin. If patient is reluctant to take  aspirin now, she can resume from 12 weeks' gestation.  -Consider vaginal delivery.  -Follow-up with cardiologist at least once in this pregnancy.      PNV  NIPT today (not done yet)  [place] MFM 2 weeks  Annamarie Major, MD, [facility], [facility] 10/20/2020  2:12 PM

## 2020-10-20 NOTE — Patient Instructions (Signed)
Thank you for choosing [facility]. As part of our ongoing efforts to improve patient experience, we would appreciate your feedback. Please fill out the short survey that you will receive by mail or MyChart. Your opinion is important to us! -Dr Harris  Second Trimester of Pregnancy  The second trimester of pregnancy is from week 13 through week 27. This is months 4 through 6 of pregnancy. The second trimester is often a time when you feel your best. Your body has adjusted to being pregnant, and you begin to feel better physically. During the second trimester:  Morning sickness has lessened or stopped completely.  You may have more energy.  You may have an increase in appetite. The second trimester is also a time when the unborn baby (fetus) is growing rapidly. At the end of the sixth month, the fetus may be up to 12 inches long and weigh about 1 pounds. You will likely begin to feel the baby move (quickening) between 16 and 20 weeks of pregnancy. Body changes during your second trimester Your body continues to go through many changes during your second trimester. The changes vary and generally return to normal after the baby is born. Physical changes  Your weight will continue to increase. You will notice your lower abdomen bulging out.  You may begin to get stretch marks on your hips, abdomen, and breasts.  Your breasts will continue to grow and to become tender.  Dark spots or blotches (chloasma or mask of pregnancy) may develop on your face.  A dark line from your belly button to the pubic area (linea nigra) may appear.  You may have changes in your hair. These can include thickening of your hair, rapid growth, and changes in texture. Some people also have hair loss during or after pregnancy, or hair that feels dry or thin. Health changes  You may develop headaches.  You may have heartburn.  You may develop constipation.  You may develop hemorrhoids or swollen, bulging  veins (varicose veins).  Your gums may bleed and may be sensitive to brushing and flossing.  You may urinate more often because the fetus is pressing on your bladder.  You may have back pain. This is caused by: ? Weight gain. ? Pregnancy hormones that are relaxing the joints in your pelvis. ? A shift in weight and the muscles that support your balance. Follow these instructions at home: Medicines  Follow your health care provider's instructions regarding medicine use. Specific medicines may be either safe or unsafe to take during pregnancy. Do not take any medicines unless approved by your health care provider.  Take a prenatal vitamin that contains at least 600 micrograms (mcg) of folic acid. Eating and drinking  Eat a healthy diet that includes fresh fruits and vegetables, whole grains, good sources of protein such as meat, eggs, or tofu, and low-fat dairy products.  Avoid raw meat and unpasteurized juice, milk, and cheese. These carry germs that can harm you and your baby.  You may need to take these actions to prevent or treat constipation: ? Drink enough fluid to keep your urine pale yellow. ? Eat foods that are high in fiber, such as beans, whole grains, and fresh fruits and vegetables. ? Limit foods that are high in fat and processed sugars, such as fried or sweet foods. Activity  Exercise only as directed by your health care provider. Most people can continue their usual exercise routine during pregnancy. Try to exercise for 30 minutes at least   5 days a week. Stop exercising if you develop contractions in your uterus.  Stop exercising if you develop pain or cramping in the lower abdomen or lower back.  Avoid exercising if it is very hot or humid or if you are at a high altitude.  Avoid heavy lifting.  If you choose to, you may have sex unless your health care provider tells you not to. Relieving pain and discomfort  Wear a supportive bra to prevent discomfort from  breast tenderness.  Take warm sitz baths to soothe any pain or discomfort caused by hemorrhoids. Use hemorrhoid cream if your health care provider approves.  Rest with your legs raised (elevated) if you have leg cramps or low back pain.  If you develop varicose veins: ? Wear support hose as told by your health care provider. ? Elevate your feet for 15 minutes, 3-4 times a day. ? Limit salt in your diet. Safety  Wear your seat belt at all times when driving or riding in a car.  Talk with your health care provider if someone is verbally or physically abusive to you. Lifestyle  Do not use hot tubs, steam rooms, or saunas.  Do not douche. Do not use tampons or scented sanitary pads.  Avoid cat litter boxes and soil used by cats. These carry germs that can cause birth defects in the baby and possibly loss of the fetus by miscarriage or stillbirth.  Do not use herbal remedies, alcohol, illegal drugs, or medicines that are not approved by your health care provider. Chemicals in these products can harm your baby.  Do not use any products that contain nicotine or tobacco, such as cigarettes, e-cigarettes, and chewing tobacco. If you need help quitting, ask your health care provider. General instructions  During a routine prenatal visit, your health care provider will do a physical exam and other tests. He or she will also discuss your overall health. Keep all follow-up visits. This is important.  Ask your health care provider for a referral to a local prenatal education class.  Ask for help if you have counseling or nutritional needs during pregnancy. Your health care provider can offer advice or refer you to specialists for help with various needs. Where to find more information  American Pregnancy Association: americanpregnancy.org  American College of Obstetricians and Gynecologists: acog.org/en/Womens%20Health/Pregnancy  Office on Women's Health: womenshealth.gov/pregnancy Contact  a health care provider if you have:  A headache that does not go away when you take medicine.  Vision changes or you see spots in front of your eyes.  Mild pelvic cramps, pelvic pressure, or nagging pain in the abdominal area.  Persistent nausea, vomiting, or diarrhea.  A bad-smelling vaginal discharge or foul-smelling urine.  Pain when you urinate.  Sudden or extreme swelling of your face, hands, ankles, feet, or legs.  A fever. Get help right away if you:  Have fluid leaking from your vagina.  Have spotting or bleeding from your vagina.  Have severe abdominal cramping or pain.  Have difficulty breathing.  Have chest pain.  Have fainting spells.  Have not felt your baby move for the time period told by your health care provider.  Have new or increased pain, swelling, or redness in an arm or leg. Summary  The second trimester of pregnancy is from week 13 through week 27 (months 4 through 6).  Do not use herbal remedies, alcohol, illegal drugs, or medicines that are not approved by your health care provider. Chemicals in these products can   harm your baby.  Exercise only as directed by your health care provider. Most people can continue their usual exercise routine during pregnancy.  Keep all follow-up visits. This is important. This information is not intended to replace advice given to you by your health care provider. Make sure you discuss any questions you have with your health care provider. Document Revised: 01/21/2020 Document Reviewed: 11/27/2019 Elsevier Patient Education  2021 Elsevier Inc.  

## 2020-10-25 LAB — MATERNIT21 PLUS CORE+SCA
Fetal Fraction: 30
Monosomy X (Turner Syndrome): NOT DETECTED
Result (T21): NEGATIVE
Trisomy 13 (Patau syndrome): NEGATIVE
Trisomy 18 (Edwards syndrome): NEGATIVE
Trisomy 21 (Down syndrome): NEGATIVE
XXX (Triple X Syndrome): NOT DETECTED
XXY (Klinefelter Syndrome): NOT DETECTED
XYY (Jacobs Syndrome): NOT DETECTED

## 2020-11-04 ENCOUNTER — Other Ambulatory Visit: Payer: Self-pay | Admitting: Obstetrics and Gynecology

## 2020-11-04 DIAGNOSIS — Z8673 Personal history of transient ischemic attack (TIA), and cerebral infarction without residual deficits: Secondary | ICD-10-CM

## 2020-11-09 ENCOUNTER — Other Ambulatory Visit: Payer: Self-pay

## 2020-11-09 ENCOUNTER — Ambulatory Visit: Payer: Medicaid Other | Attending: Maternal & Fetal Medicine

## 2020-11-09 DIAGNOSIS — Z3A18 18 weeks gestation of pregnancy: Secondary | ICD-10-CM | POA: Diagnosis not present

## 2020-11-09 DIAGNOSIS — O321XX0 Maternal care for breech presentation, not applicable or unspecified: Secondary | ICD-10-CM

## 2020-11-09 DIAGNOSIS — O99891 Other specified diseases and conditions complicating pregnancy: Secondary | ICD-10-CM | POA: Diagnosis not present

## 2020-11-09 DIAGNOSIS — Z7982 Long term (current) use of aspirin: Secondary | ICD-10-CM | POA: Diagnosis not present

## 2020-11-09 DIAGNOSIS — Z8673 Personal history of transient ischemic attack (TIA), and cerebral infarction without residual deficits: Secondary | ICD-10-CM | POA: Diagnosis not present

## 2020-11-09 DIAGNOSIS — O0991 Supervision of high risk pregnancy, unspecified, first trimester: Secondary | ICD-10-CM

## 2020-11-17 ENCOUNTER — Encounter: Payer: Self-pay | Admitting: Obstetrics and Gynecology

## 2020-11-17 ENCOUNTER — Other Ambulatory Visit: Payer: Self-pay

## 2020-11-17 ENCOUNTER — Ambulatory Visit (INDEPENDENT_AMBULATORY_CARE_PROVIDER_SITE_OTHER): Payer: Medicaid Other | Admitting: Obstetrics and Gynecology

## 2020-11-17 VITALS — BP 110/70 | Ht 60.0 in | Wt 115.4 lb

## 2020-11-17 DIAGNOSIS — O0991 Supervision of high risk pregnancy, unspecified, first trimester: Secondary | ICD-10-CM

## 2020-11-17 DIAGNOSIS — Z3A19 19 weeks gestation of pregnancy: Secondary | ICD-10-CM

## 2020-11-17 NOTE — Patient Instructions (Signed)

## 2020-11-17 NOTE — Progress Notes (Signed)
Routine Prenatal Care Visit  Subjective  Candice Villarreal is a 20 y.o. G1P0 at [redacted]w[redacted]d being seen today for ongoing prenatal care.  She is currently monitored for the following issues for this low-risk pregnancy and has Tobacco use disorder; PFO (patent foramen ovale); Numbness and tingling of right side of face; TIA (transient ischemic attack); and Supervision of high risk pregnancy, antepartum, first trimester on their problem list.  ----------------------------------------------------------------------------------- Patient reports no complaints.   Contractions: Not present. Vag. Bleeding: None.  Movement: Present. Denies leaking of fluid.  ----------------------------------------------------------------------------------- The following portions of the patient's history were reviewed and updated as appropriate: allergies, current medications, past family history, past medical history, past social history, past surgical history and problem list. Problem list updated.   Objective  Blood pressure 110/70, height 5' (1.524 m), weight 115 lb 6.4 oz (52.3 kg), last menstrual period 07/02/2020. Pregravid weight 106 lb (48.1 kg) Total Weight Gain 9 lb 6.4 oz (4.264 kg) Urinalysis:      Fetal Status: Fetal Heart Rate (bpm): 120   Movement: Present     General:  Alert, oriented and cooperative. Patient is in no acute distress.  Skin: Skin is warm and dry. No rash noted.   Cardiovascular: Normal heart rate noted  Respiratory: Normal respiratory effort, no problems with respiration noted  Abdomen: Soft, gravid, appropriate for gestational age. Pain/Pressure: Absent     Pelvic:  Cervical exam deferred        Extremities: Normal range of motion.  Edema: None  Mental Status: Normal mood and affect. Normal behavior. Normal judgment and thought content.     Assessment   20 y.o. G1P0 at [redacted]w[redacted]d by  04/08/2021, by Last Menstrual Period presenting for routine prenatal visit  Plan   [email or web address]  Problems (from 07/02/20 to present)    Problem Noted Resolved   Supervision of high risk pregnancy, antepartum, first trimester 08/25/2020 by Zipporah Plants, CNM No   Overview Addendum 11/17/2020  4:42 PM by Natale Milch, MD    Clinic [facility] Prenatal Labs  Dating  LMP = 7w [place] Blood type: O/Positive/-- (12/16 1125)   Genetic Screen   NIPS: Antibody:Negative (12/16 1125)  Anatomic [place] complete Rubella: 1.76 (12/16 1125) Varicella: immune  GTT  Third trimester:  RPR: Non Reactive (12/16 1125)   Rhogam  n/a HBsAg: Negative (12/16 1125)   TDaP vaccine                       Flu Shot: HIV: Non Reactive (12/16 1125)   Baby Food                                GBS:   Contraception  Pap: n/a (<21 yo)  CBB  no   CS/VBAC n/a   Support Person BF    Recommendations from 8 week visit with MFM regarding hx of PFO:  -MFM fetal anatomy scan.  -Fetal echocardiography at 20 weeks.  -Low-molecular weight heparin 40 mg subcutaneously daily  from now till 6 weeks postpartum. Patient was encouraged on  the benefit of taking heparin prophylaxis. 10/20/20- Pt refuses to take the Lovenox and expressess understanding but lack of concern for blood clot risk  -Continue low-dose aspirin.   -Consider vaginal delivery.  -Follow-up with cardiologist at least once in this pregnancy. 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    Return in about 4 weeks (around 12/15/2020) for ROB in person.  Natale Milch MD [facility], [facility] 11/17/2020, 4:56 PM

## 2020-11-23 ENCOUNTER — Other Ambulatory Visit: Payer: Self-pay | Admitting: Obstetrics and Gynecology

## 2020-11-23 DIAGNOSIS — Z8279 Family history of other congenital malformations, deformations and chromosomal abnormalities: Secondary | ICD-10-CM

## 2020-11-23 DIAGNOSIS — O0991 Supervision of high risk pregnancy, unspecified, first trimester: Secondary | ICD-10-CM

## 2020-12-09 ENCOUNTER — Other Ambulatory Visit: Payer: Self-pay | Admitting: Obstetrics and Gynecology

## 2020-12-09 DIAGNOSIS — Z8673 Personal history of transient ischemic attack (TIA), and cerebral infarction without residual deficits: Secondary | ICD-10-CM

## 2020-12-15 ENCOUNTER — Encounter: Payer: Self-pay | Admitting: Obstetrics and Gynecology

## 2020-12-15 ENCOUNTER — Other Ambulatory Visit: Payer: Self-pay

## 2020-12-15 ENCOUNTER — Ambulatory Visit (INDEPENDENT_AMBULATORY_CARE_PROVIDER_SITE_OTHER): Payer: Medicaid Other | Admitting: Obstetrics and Gynecology

## 2020-12-15 VITALS — BP 110/68 | Wt 121.0 lb

## 2020-12-15 DIAGNOSIS — O0991 Supervision of high risk pregnancy, unspecified, first trimester: Secondary | ICD-10-CM

## 2020-12-15 DIAGNOSIS — Z3A20 20 weeks gestation of pregnancy: Secondary | ICD-10-CM

## 2020-12-15 LAB — POCT URINALYSIS DIPSTICK OB
Glucose, UA: NEGATIVE
POC,PROTEIN,UA: NEGATIVE

## 2020-12-15 NOTE — Progress Notes (Signed)
Routine Prenatal Care Visit  Subjective  Candice Villarreal is a 20 y.o. G1P0 at [redacted]w[redacted]d being seen today for ongoing prenatal care.  She is currently monitored for the following issues for this high-risk pregnancy and has Tobacco use disorder; PFO (patent foramen ovale); Numbness and tingling of right side of face; TIA (transient ischemic attack); and Supervision of high risk pregnancy, antepartum, first trimester on their problem list.  ----------------------------------------------------------------------------------- Patient reports no complaints.   Contractions: Not present. Vag. Bleeding: None.  Movement: Present. Denies leaking of fluid.  ----------------------------------------------------------------------------------- The following portions of the patient's history were reviewed and updated as appropriate: allergies, current medications, past family history, past medical history, past social history, past surgical history and problem list. Problem list updated.   Objective  Blood pressure 110/68, weight 121 lb (54.9 kg), last menstrual period 07/02/2020. Pregravid weight 106 lb (48.1 kg) Total Weight Gain 15 lb (6.804 kg) Urinalysis:      Fetal Status: Fetal Heart Rate (bpm): 145 Fundal Height: 23 cm Movement: Present     General:  Alert, oriented and cooperative. Patient is in no acute distress.  Skin: Skin is warm and dry. No rash noted.   Cardiovascular: Normal heart rate noted  Respiratory: Normal respiratory effort, no problems with respiration noted  Abdomen: Soft, gravid, appropriate for gestational age. Pain/Pressure: Absent     Pelvic:  Cervical exam deferred        Extremities: Normal range of motion.     ental Status: Normal mood and affect. Normal behavior. Normal judgment and thought content.     Assessment   20 y.o. G1P0 at [redacted]w[redacted]d by  04/08/2021, by Last Menstrual Period presenting for routine prenatal visit  Plan   [email or web address] Problems (from 07/02/20 to  present)    Problem Noted Resolved   Supervision of high risk pregnancy, antepartum, first trimester 08/25/2020 by Zipporah Plants, CNM No   Overview Addendum 12/15/2020  2:44 PM by Zipporah Plants, CNM     Nursing Staff Provider  Office Location  [facility] Dating   LMP = 7w [place]  Language  English Anatomy US   MFM - wnl  Flu Vaccine   declined Genetic Screen  NIPS:   Neg x3, XY  TDaP vaccine    Hgb A1C or  GTT Early : n/a Third trimester :   Rhogam   n/a   LAB RESULTS   Feeding Plan  breast Blood Type O/Positive/-- (12/16 1125)   Contraception  unsure Antibody Negative (12/16 1125)  Circumcision  Rubella 1.76 (12/16 1125)  Pediatrician   RPR Non Reactive (12/16 1125)   Support Person  BF HBsAg Negative (12/16 1125)   Prenatal Classes  resources discussed HIV Non Reactive (12/16 1125)    Varicella  immune  BTL Consent  n/a GBS  (For PCN allergy, check sensitivities)        VBAC Consent  n/a Pap  n/a (<21 yo)    Hgb Electro   n/a    CF      SMA          Recommendations from 8 week visit with MFM regarding hx of PFO:  -MFM fetal anatomy scan.  -Fetal echocardiography at 20 weeks.  -Low-molecular weight heparin 40 mg subcutaneously daily  from now till 6 weeks postpartum. Patient was encouraged on  the benefit of taking heparin prophylaxis. 10/20/20- Pt refuses to take the Lovenox and expressess understanding but lack of concern for blood clot risk  -Continue low-dose aspirin.   -  Consider vaginal delivery.  -Follow-up with cardiologist at least once in this pregnancy. (01/2021 planned)      Previous Version      -Reviewed process for 1h GTT GDM screening at next visit with third trimester labs -Patient to keep previously scheduled growth [place] to f/u with MFM -Discussed resources for childbirth education  Second trimester precautions including but not limited to vaginal bleeding, contractions, leaking of fluid and fetal movement were reviewed in detail with the patient.    Return in  about 4 weeks (around 01/12/2021) for ROB with 1h GTT.  Zipporah Plants, CNM, MSN [facility], [facility] 12/15/2020, 2:46 PM

## 2020-12-21 ENCOUNTER — Other Ambulatory Visit: Payer: Self-pay

## 2020-12-21 ENCOUNTER — Ambulatory Visit: Payer: Medicaid Other | Attending: Maternal & Fetal Medicine

## 2020-12-21 DIAGNOSIS — O99891 Other specified diseases and conditions complicating pregnancy: Secondary | ICD-10-CM | POA: Diagnosis not present

## 2020-12-21 DIAGNOSIS — Z8673 Personal history of transient ischemic attack (TIA), and cerebral infarction without residual deficits: Secondary | ICD-10-CM | POA: Diagnosis not present

## 2020-12-21 DIAGNOSIS — Z363 Encounter for antenatal screening for malformations: Secondary | ICD-10-CM | POA: Diagnosis not present

## 2020-12-21 DIAGNOSIS — Z3A24 24 weeks gestation of pregnancy: Secondary | ICD-10-CM | POA: Diagnosis not present

## 2021-01-12 ENCOUNTER — Encounter: Payer: Medicaid Other | Admitting: Obstetrics and Gynecology

## 2021-01-18 ENCOUNTER — Other Ambulatory Visit: Payer: Self-pay

## 2021-01-18 ENCOUNTER — Other Ambulatory Visit: Payer: Medicaid Other

## 2021-01-18 ENCOUNTER — Encounter: Payer: Self-pay | Admitting: Obstetrics and Gynecology

## 2021-01-18 ENCOUNTER — Ambulatory Visit (INDEPENDENT_AMBULATORY_CARE_PROVIDER_SITE_OTHER): Payer: Medicaid Other | Admitting: Obstetrics and Gynecology

## 2021-01-18 VITALS — BP 110/70 | Ht 60.0 in | Wt 125.2 lb

## 2021-01-18 DIAGNOSIS — Z3A28 28 weeks gestation of pregnancy: Secondary | ICD-10-CM

## 2021-01-18 DIAGNOSIS — R875 Abnormal microbiological findings in specimens from female genital organs: Secondary | ICD-10-CM

## 2021-01-18 DIAGNOSIS — O0991 Supervision of high risk pregnancy, unspecified, first trimester: Secondary | ICD-10-CM

## 2021-01-18 DIAGNOSIS — L0231 Cutaneous abscess of buttock: Secondary | ICD-10-CM

## 2021-01-18 LAB — POCT URINALYSIS DIPSTICK OB
Glucose, UA: NEGATIVE
POC,PROTEIN,UA: NEGATIVE

## 2021-01-18 MED ORDER — AMOXICILLIN-POT CLAVULANATE 875-125 MG PO TABS: 1.0000 | ORAL_TABLET | Freq: Two times a day (BID) | ORAL | 0 refills | Status: AC

## 2021-01-18 NOTE — Progress Notes (Signed)
Routine Prenatal Care Visit  Subjective  Candice Villarreal is a 20 y.o. G1P0 at [redacted]w[redacted]d being seen today for ongoing prenatal care.  She is currently monitored for the following issues for this high-risk pregnancy and has Tobacco use disorder; PFO (patent foramen ovale); Numbness and tingling of right side of face; TIA (transient ischemic attack); and Supervision of high risk pregnancy, antepartum, first trimester on their problem list.  ----------------------------------------------------------------------------------- Patient reports boil of left labia which has been painful. Has been experiencing increased vaginal discharge.  Contractions: Not present. Vag. Bleeding: None.  Movement: Present. Denies leaking of fluid.  ----------------------------------------------------------------------------------- The following portions of the patient's history were reviewed and updated as appropriate: allergies, current medications, past family history, past medical history, past social history, past surgical history and problem list. Problem list updated.   Objective  Blood pressure 110/70, height 5' (1.524 m), weight 125 lb 3.2 oz (56.8 kg), last menstrual period 07/02/2020. Pregravid weight 106 lb (48.1 kg) Total Weight Gain 19 lb 3.2 oz (8.709 kg) Urinalysis:      Fetal Status: Fetal Heart Rate (bpm): 140 Fundal Height: 28 cm Movement: Present     General:  Alert, oriented and cooperative. Patient is in no acute distress.  Skin: Skin is warm and dry. No rash noted.   Cardiovascular: Normal heart rate noted  Respiratory: Normal respiratory effort, no problems with respiration noted  Abdomen: Soft, gravid, appropriate for gestational age. Pain/Pressure: Absent     Pelvic:  Cervical exam deferred       Small left labia abscess which appears to of ruptured on its own and be resolving.   Extremities: Normal range of motion.  Edema: None  Mental Status: Normal mood and affect. Normal behavior. Normal  judgment and thought content.     Assessment   20 y.o. G1P0 at [redacted]w[redacted]d by  04/08/2021, by Last Menstrual Period presenting for routine prenatal visit  Plan   [email or web address] Problems (from 07/02/20 to present)    Problem Noted Resolved   Supervision of high risk pregnancy, antepartum, first trimester 08/25/2020 by Zipporah Plants, CNM No   Overview Addendum 12/15/2020  2:44 PM by Zipporah Plants, CNM     Nursing Staff Provider  Office Location  [facility] Dating   LMP = 7w [place]  Language  English Anatomy US   MFM - wnl  Flu Vaccine   declined Genetic Screen  NIPS:   Neg x3, XY  TDaP vaccine    Hgb A1C or  GTT Early : n/a Third trimester :   Rhogam   n/a   LAB RESULTS   Feeding Plan  breast Blood Type O/Positive/-- (12/16 1125)   Contraception  unsure Antibody Negative (12/16 1125)  Circumcision  Rubella 1.76 (12/16 1125)  Pediatrician   RPR Non Reactive (12/16 1125)   Support Person  BF HBsAg Negative (12/16 1125)   Prenatal Classes  resources discussed HIV Non Reactive (12/16 1125)    Varicella  immune  BTL Consent  n/a GBS  (For PCN allergy, check sensitivities)        VBAC Consent  n/a Pap  n/a (<21 yo)    Hgb Electro   n/a    CF      SMA           Recommendations from 8 week visit with MFM regarding hx of PFO:  -MFM fetal anatomy scan.  -Fetal echocardiography at 20 weeks.  -Low-molecular weight heparin 40 mg subcutaneously daily  from now till 6 weeks  postpartum. Patient was encouraged on  the benefit of taking heparin prophylaxis. 10/20/20- Pt refuses to take the Lovenox and expressess understanding but lack of concern for blood clot risk  -Continue low-dose aspirin.   -Consider vaginal delivery.  -Follow-up with cardiologist at least once in this pregnancy. (01/2021 planned)      Previous Version       Small abscess, seems to be resolving on left labia. Advised sitz baths twice a day. Start augmentin and follow up in 1 week.  Discussed planning for FMLA and how to discuss  with work for after delivery.   Gestational age appropriate obstetric precautions including but not limited to vaginal bleeding, contractions, leaking of fluid and fetal movement were reviewed in detail with the patient.    Return in about 1 week (around 01/25/2021) for GYN visit.  Natale Milch MD [facility], [facility] 01/18/2021, 3:47 PM

## 2021-01-18 NOTE — Patient Instructions (Signed)
How to Take a Sitz Bath A sitz bath is a warm water bath that may be used to care for your rectum, genital area, or the area between your rectum and genitals (perineum). In a sitz bath, the water only comes up to your hips and covers your buttocks. A sitz bath may be done in a bathtub or with a portable sitz bath that fits over the toilet. Your health care provider may recommend a sitz bath to help:  Relieve pain and discomfort after delivering a baby.  Relieve pain and itching from hemorrhoids or anal fissures.  Relieve pain after certain surgeries.  Relax muscles that are sore or tight. How to take a sitz bath Take 3-4 sitz baths a day, or as many as told by your health care provider. Bathtub sitz bath To take a sitz bath in a bathtub: 1. Partially fill a bathtub with warm water. The water should be deep enough to cover your hips and buttocks when you are sitting in the tub. 2. Follow your health care provider's instructions if you are told to put medicine in the water. 3. Sit in the water. Open the tub drain a little, and leave it open during your bath. 4. Turn on the warm water again, enough to replace the water that is draining out. Keep the water running throughout your bath. This helps keep the water at the right level and temperature. 5. Soak in the water for 15-20 minutes, or as long as told by your health care provider. 6. When you are done, be careful when you stand up. You may feel dizzy. 7. After the sitz bath, pat yourself dry. Do not rub your skin to dry it.   Over-the-toilet sitz bath To take a sitz bath with an over-the-toilet basin: 1. Follow the manufacturer's instructions. 2. Fill the basin with warm water. 3. Follow your health care provider's instructions if you were told to put medicine in the water. 4. Sit on the seat. Make sure the water covers your buttocks and perineum. 5. Soak in the water for 15-20 minutes, or as long as told by your health care  provider. 6. After the sitz bath, pat yourself dry. Do not rub your skin to dry it. 7. Clean and dry the basin between uses. 8. Discard the basin if it cracks, or according to the manufacturer's instructions.   Contact a health care provider if:  Your pain or itching gets worse. Do not continue with sitz baths if your symptoms get worse.  You have new symptoms. Do not continue with sitz baths until you talk with your health care provider. Summary  A sitz bath is a warm water bath in which the water only comes up to your hips and covers your buttocks.  A sitz bath may help relieve pain and discomfort after delivering a baby. It also may help with pain and itching from hemorrhoids or anal fissures, or pain after certain surgeries. It can also help to relax muscles that are sore or tight.  Take 3-4 sitz baths a day, or as many as told by your health care provider. Soak in the water for 15-20 minutes.  Do not continue with sitz baths if your symptoms get worse. This information is not intended to replace advice given to you by your health care provider. Make sure you discuss any questions you have with your health care provider. Document Revised: 04/29/2020 Document Reviewed: 04/29/2020 Elsevier Patient Education  2021 Elsevier Inc.  

## 2021-01-19 LAB — 28 WEEK RH+PANEL
Basophils Absolute: 0 10*3/uL (ref 0.0–0.2)
Basos: 0 %
EOS (ABSOLUTE): 0.1 10*3/uL (ref 0.0–0.4)
Eos: 1 %
Gestational Diabetes Screen: 115 mg/dL (ref 65–139)
HIV Screen 4th Generation wRfx: NONREACTIVE
Hematocrit: 31.3 % — ABNORMAL LOW (ref 34.0–46.6)
Hemoglobin: 10.4 g/dL — ABNORMAL LOW (ref 11.1–15.9)
Immature Grans (Abs): 0.1 10*3/uL (ref 0.0–0.1)
Immature Granulocytes: 1 %
Lymphocytes Absolute: 1.7 10*3/uL (ref 0.7–3.1)
Lymphs: 17 %
MCH: 29.3 pg (ref 26.6–33.0)
MCHC: 33.2 g/dL (ref 31.5–35.7)
MCV: 88 fL (ref 79–97)
Monocytes Absolute: 0.5 10*3/uL (ref 0.1–0.9)
Monocytes: 5 %
Neutrophils Absolute: 7.5 10*3/uL — ABNORMAL HIGH (ref 1.4–7.0)
Neutrophils: 76 %
Platelets: 236 10*3/uL (ref 150–450)
RBC: 3.55 x10E6/uL — ABNORMAL LOW (ref 3.77–5.28)
RDW: 11.8 % (ref 11.7–15.4)
RPR Ser Ql: NONREACTIVE
WBC: 9.8 10*3/uL (ref 3.4–10.8)

## 2021-01-21 LAB — NUSWAB VAGINITIS PLUS (VG+)
Atopobium vaginae: HIGH Score — AB
BVAB 2: HIGH Score — AB
Candida albicans, NAA: NEGATIVE
Candida glabrata, NAA: NEGATIVE
Chlamydia trachomatis, NAA: NEGATIVE
Megasphaera 1: HIGH Score — AB
Neisseria gonorrhoeae, NAA: NEGATIVE
Trich vag by NAA: NEGATIVE

## 2021-01-31 ENCOUNTER — Encounter: Payer: Self-pay | Admitting: Obstetrics and Gynecology

## 2021-01-31 ENCOUNTER — Ambulatory Visit (INDEPENDENT_AMBULATORY_CARE_PROVIDER_SITE_OTHER): Payer: Medicaid Other | Admitting: Obstetrics and Gynecology

## 2021-01-31 ENCOUNTER — Other Ambulatory Visit: Payer: Self-pay

## 2021-01-31 VITALS — BP 110/70 | Ht 60.0 in | Wt 129.2 lb

## 2021-01-31 DIAGNOSIS — B9689 Other specified bacterial agents as the cause of diseases classified elsewhere: Secondary | ICD-10-CM

## 2021-01-31 DIAGNOSIS — N760 Acute vaginitis: Secondary | ICD-10-CM

## 2021-01-31 DIAGNOSIS — O0993 Supervision of high risk pregnancy, unspecified, third trimester: Secondary | ICD-10-CM

## 2021-01-31 DIAGNOSIS — Z23 Encounter for immunization: Secondary | ICD-10-CM | POA: Diagnosis not present

## 2021-01-31 MED ORDER — METRONIDAZOLE 500 MG PO TABS: 500.0000 mg | ORAL_TABLET | Freq: Two times a day (BID) | ORAL | 0 refills | Status: DC

## 2021-01-31 NOTE — Patient Instructions (Signed)

## 2021-01-31 NOTE — Progress Notes (Signed)
Routine Prenatal Care Visit  Subjective  Candice Villarreal is a 20 y.o. G1P0 at [redacted]w[redacted]d being seen today for ongoing prenatal care.  She is currently monitored for the following issues for this high-risk pregnancy and has Tobacco use disorder; PFO (patent foramen ovale); Numbness and tingling of right side of face; TIA (transient ischemic attack); and Supervision of high risk pregnancy, antepartum on their problem list.  ----------------------------------------------------------------------------------- Patient reports no complaints.   Contractions: Not present. Vag. Bleeding: None.  Movement: Present. Denies leaking of fluid.  ----------------------------------------------------------------------------------- The following portions of the patient's history were reviewed and updated as appropriate: allergies, current medications, past family history, past medical history, past social history, past surgical history and problem list. Problem list updated.   Objective  Blood pressure 110/70, height 5' (1.524 m), weight 129 lb 3.2 oz (58.6 kg), last menstrual period 07/02/2020. Pregravid weight 106 lb (48.1 kg) Total Weight Gain 23 lb 3.2 oz (10.5 kg) Urinalysis:      Fetal Status: Fetal Heart Rate (bpm): 135 Fundal Height: 30 cm Movement: Present     General:  Alert, oriented and cooperative. Patient is in no acute distress.  Skin: Skin is warm and dry. No rash noted.   Cardiovascular: Normal heart rate noted  Respiratory: Normal respiratory effort, no problems with respiration noted  Abdomen: Soft, gravid, appropriate for gestational age. Pain/Pressure: Absent     Pelvic:  Cervical exam deferred      left labia is well healed  Extremities: Normal range of motion.  Edema: None  Mental Status: Normal mood and affect. Normal behavior. Normal judgment and thought content.     Assessment   20 y.o. G1P0 at [redacted]w[redacted]d by  04/08/2021, by Last Menstrual Period presenting for routine prenatal  visit  Plan   pregnancy1 Problems (from 07/02/20 to present)    Problem Noted Resolved   Supervision of high risk pregnancy, antepartum 08/25/2020 by Zipporah Plants, CNM No   Overview Addendum 01/31/2021 11:22 AM by Natale Milch, MD     Nursing Staff Provider  Office Location  [facility] Dating   LMP = 7w [place]  Language  English Anatomy US   MFM - wnl  Flu Vaccine   declined Genetic Screen  NIPS:   Neg x3, XY  TDaP vaccine   01/31/2021 Hgb A1C or  GTT Early : n/a Third trimester : 115  Rhogam   n/a   LAB RESULTS   Feeding Plan  breast Blood Type O/Positive/-- (12/16 1125)   Contraception  unsure Antibody Negative (12/16 1125)  Circumcision  Rubella 1.76 (12/16 1125)  Pediatrician   RPR Non Reactive (12/16 1125)   Support Person  BF HBsAg Negative (12/16 1125)   Prenatal Classes  resources discussed HIV Non Reactive (12/16 1125)    Varicella  immune  BTL Consent  n/a GBS  (For PCN allergy, check sensitivities)        VBAC Consent  n/a Pap  n/a (<21 yo)    Hgb Electro   n/a    CF      SMA           Recommendations from 8 week visit with MFM regarding hx of PFO:  -MFM fetal anatomy scan.  -Fetal echocardiography at 20 weeks.  -Low-molecular weight heparin 40 mg subcutaneously daily  from now till 6 weeks postpartum. Patient was encouraged on  the benefit of taking heparin prophylaxis. 10/20/20- Pt refuses to take the Lovenox and expressess understanding but lack of concern for  blood clot risk  -Continue low-dose aspirin.   -Consider vaginal delivery.  -Follow-up with cardiologist at least once in this pregnancy. (01/2021 planned)      Previous Version      Will treat BV with flagyl TDAP today  Gestational age appropriate obstetric precautions including but not limited to vaginal bleeding, contractions, leaking of fluid and fetal movement were reviewed in detail with the patient.    Return for ROB in person.  Natale Milch MD [facility], [facility] 01/31/2021, 12:19 PM

## 2021-02-07 ENCOUNTER — Other Ambulatory Visit: Payer: Self-pay | Admitting: Obstetrics and Gynecology

## 2021-02-07 DIAGNOSIS — O0993 Supervision of high risk pregnancy, unspecified, third trimester: Secondary | ICD-10-CM

## 2021-02-10 ENCOUNTER — Telehealth: Payer: Self-pay

## 2021-02-10 ENCOUNTER — Ambulatory Visit: Payer: Medicaid Other | Admitting: Physician Assistant

## 2021-02-10 ENCOUNTER — Other Ambulatory Visit (HOSPITAL_COMMUNITY): Payer: Medicaid Other

## 2021-02-10 DIAGNOSIS — Z8774 Personal history of (corrected) congenital malformations of heart and circulatory system: Secondary | ICD-10-CM

## 2021-02-10 NOTE — Telephone Encounter (Signed)
Called and rescheduled 1 year PFO closure echo/visit to September 2022. The patient is pregnant and bubble study is contraindicated. She was grateful for call and agrees with plan.

## 2021-02-14 ENCOUNTER — Encounter: Payer: Self-pay | Admitting: Obstetrics and Gynecology

## 2021-02-14 ENCOUNTER — Ambulatory Visit (INDEPENDENT_AMBULATORY_CARE_PROVIDER_SITE_OTHER): Payer: Medicaid Other | Admitting: Obstetrics and Gynecology

## 2021-02-14 ENCOUNTER — Other Ambulatory Visit: Payer: Self-pay

## 2021-02-14 VITALS — BP 100/70 | Ht 60.0 in | Wt 130.4 lb

## 2021-02-14 DIAGNOSIS — Z3A32 32 weeks gestation of pregnancy: Secondary | ICD-10-CM

## 2021-02-14 DIAGNOSIS — O09893 Supervision of other high risk pregnancies, third trimester: Secondary | ICD-10-CM

## 2021-02-14 NOTE — Progress Notes (Signed)
Routine Prenatal Care Visit  Subjective  Candice Villarreal is a 20 y.o. G1P0 at [redacted]w[redacted]d being seen today for ongoing prenatal care.  She is currently monitored for the following issues for this high-risk pregnancy and has Tobacco use disorder; PFO (patent foramen ovale); Numbness and tingling of right side of face; TIA (transient ischemic attack); and Supervision of high risk pregnancy, antepartum on their problem list.  ----------------------------------------------------------------------------------- Patient reports no complaints.   Contractions: Not present. Vag. Bleeding: None.  Movement: Present. Denies leaking of fluid.  ----------------------------------------------------------------------------------- The following portions of the patient's history were reviewed and updated as appropriate: allergies, current medications, past family history, past medical history, past social history, past surgical history and problem list. Problem list updated.   Objective  Blood pressure 100/70, height 5' (1.524 m), weight 130 lb 6.4 oz (59.1 kg), last menstrual period 07/02/2020. Pregravid weight 106 lb (48.1 kg) Total Weight Gain 24 lb 6.4 oz (11.1 kg) Urinalysis:      Fetal Status: Fetal Heart Rate (bpm): 140 Fundal Height: 30 cm Movement: Present     General:  Alert, oriented and cooperative. Patient is in no acute distress.  Skin: Skin is warm and dry. No rash noted.   Cardiovascular: Normal heart rate noted  Respiratory: Normal respiratory effort, no problems with respiration noted  Abdomen: Soft, gravid, appropriate for gestational age. Pain/Pressure: Absent     Pelvic:  Cervical exam deferred        Extremities: Normal range of motion.  Edema: None  Mental Status: Normal mood and affect. Normal behavior. Normal judgment and thought content.     Assessment   20 y.o. G1P0 at [redacted]w[redacted]d by  04/08/2021, by Last Menstrual Period presenting for routine prenatal visit  Plan   pregnancy1  Problems (from 07/02/20 to present)     Problem Noted Resolved   Supervision of high risk pregnancy, antepartum 08/25/2020 by Zipporah Plants, CNM No   Overview Addendum 01/31/2021 11:22 AM by Natale Milch, MD     Nursing Staff Provider  Office Location  [facility] Dating   LMP = 7w [place]  Language  English Anatomy US   MFM - wnl  Flu Vaccine   declined Genetic Screen  NIPS:   Neg x3, XY  TDaP vaccine   01/31/2021 Hgb A1C or  GTT Early : n/a Third trimester : 115  Rhogam   n/a   LAB RESULTS   Feeding Plan  breast Blood Type O/Positive/-- (12/16 1125)   Contraception  unsure Antibody Negative (12/16 1125)  Circumcision  Rubella 1.76 (12/16 1125)  Pediatrician   RPR Non Reactive (12/16 1125)   Support Person  BF HBsAg Negative (12/16 1125)   Prenatal Classes  resources discussed HIV Non Reactive (12/16 1125)    Varicella  immune  BTL Consent  n/a GBS  (For PCN allergy, check sensitivities)        VBAC Consent  n/a Pap  n/a (<21 yo)    Hgb Electro   n/a    CF      SMA          Recommendations from 8 week visit with MFM regarding hx of PFO:  -MFM fetal anatomy scan.  -Fetal echocardiography at 20 weeks.  -Low-molecular weight heparin 40 mg subcutaneously daily  from now till 6 weeks postpartum. Patient was encouraged on  the benefit of taking heparin prophylaxis. 10/20/20- Pt refuses to take the Lovenox and expressess understanding but lack of concern for blood clot risk  -  Continue low-dose aspirin.   -Consider vaginal delivery.  -Follow-up with cardiologist at least once in this pregnancy. (01/2021 planned)           Discussed growth [place] in July and importance of keeping that appointment.   Gestational age appropriate obstetric precautions including but not limited to vaginal bleeding, contractions, leaking of fluid and fetal movement were reviewed in detail with the patient.    Return in about 2 weeks (around 02/28/2021) for ROB in person.  Natale Milch MD [facility], [facility] 02/14/2021, 3:16 PM

## 2021-02-14 NOTE — Patient Instructions (Signed)

## 2021-03-07 ENCOUNTER — Other Ambulatory Visit: Payer: Self-pay

## 2021-03-07 ENCOUNTER — Encounter: Payer: Self-pay | Admitting: Obstetrics and Gynecology

## 2021-03-07 ENCOUNTER — Ambulatory Visit (INDEPENDENT_AMBULATORY_CARE_PROVIDER_SITE_OTHER): Payer: Medicaid Other | Admitting: Obstetrics and Gynecology

## 2021-03-07 VITALS — BP 120/80 | Wt 131.0 lb

## 2021-03-07 DIAGNOSIS — O0993 Supervision of high risk pregnancy, unspecified, third trimester: Secondary | ICD-10-CM

## 2021-03-07 DIAGNOSIS — Z3A35 35 weeks gestation of pregnancy: Secondary | ICD-10-CM

## 2021-03-07 NOTE — Progress Notes (Signed)
Routine Prenatal Care Visit  Subjective  Candice Villarreal is a 20 y.o. G1P0 at [redacted]w[redacted]d being seen today for ongoing prenatal care.  She is currently monitored for the following issues for this high-risk pregnancy and has Tobacco use disorder; PFO (patent foramen ovale); Numbness and tingling of right side of face; TIA (transient ischemic attack); and Supervision of high risk pregnancy, antepartum on their problem list.  ----------------------------------------------------------------------------------- Patient reports no complaints.   Contractions: Not present. Vag. Bleeding: None.  Movement: Present. Leaking Fluid denies.  ----------------------------------------------------------------------------------- The following portions of the patient's history were reviewed and updated as appropriate: allergies, current medications, past family history, past medical history, past social history, past surgical history and problem list. Problem list updated.  Objective  Blood pressure 120/80, weight 131 lb (59.4 kg), last menstrual period 07/02/2020. Pregravid weight 106 lb (48.1 kg) Total Weight Gain 25 lb (11.3 kg) Urinalysis: Urine Protein    Urine Glucose    Fetal Status: Fetal Heart Rate (bpm): 140 Fundal Height: 34 cm Movement: Present     General:  Alert, oriented and cooperative. Patient is in no acute distress.  Skin: Skin is warm and dry. No rash noted.   Cardiovascular: Normal heart rate noted  Respiratory: Normal respiratory effort, no problems with respiration noted  Abdomen: Soft, gravid, appropriate for gestational age. Pain/Pressure: Absent     Pelvic:  Cervical exam deferred        Extremities: Normal range of motion.  Edema: None  Mental Status: Normal mood and affect. Normal behavior. Normal judgment and thought content.   Assessment   20 y.o. G1P0 at [redacted]w[redacted]d by  04/08/2021, by Last Menstrual Period presenting for routine prenatal visit  Plan   pregnancy1 Problems (from  07/02/20 to present)     Problem Noted Resolved   Supervision of high risk pregnancy, antepartum 08/25/2020 by Zipporah Plants, CNM No   Overview Addendum 01/31/2021 11:22 AM by Natale Milch, MD     Nursing Staff Provider  Office Location  [facility] Dating   LMP = 7w [place]  Language  English Anatomy US   MFM - wnl  Flu Vaccine   declined Genetic Screen  NIPS:   Neg x3, XY  TDaP vaccine   01/31/2021 Hgb A1C or  GTT Early : n/a Third trimester : 115  Rhogam   n/a   LAB RESULTS   Feeding Plan  breast Blood Type O/Positive/-- (12/16 1125)   Contraception  unsure Antibody Negative (12/16 1125)  Circumcision  Rubella 1.76 (12/16 1125)  Pediatrician   RPR Non Reactive (12/16 1125)   Support Person  BF HBsAg Negative (12/16 1125)   Prenatal Classes  resources discussed HIV Non Reactive (12/16 1125)    Varicella  immune  BTL Consent  n/a GBS  (For PCN allergy, check sensitivities)        VBAC Consent  n/a Pap  n/a (<21 yo)    Hgb Electro   n/a    CF      SMA          Recommendations from 8 week visit with MFM regarding hx of PFO:  -MFM fetal anatomy scan.  -Fetal echocardiography at 20 weeks.  -Low-molecular weight heparin 40 mg subcutaneously daily  from now till 6 weeks postpartum. Patient was encouraged on  the benefit of taking heparin prophylaxis. 10/20/20- Pt refuses to take the Lovenox and expressess understanding but lack of concern for blood clot risk  -Continue low-dose aspirin.   -Consider vaginal delivery.  -  Follow-up with cardiologist at least once in this pregnancy. (01/2021 planned)            Preterm labor symptoms and general obstetric precautions including but not limited to vaginal bleeding, contractions, leaking of fluid and fetal movement were reviewed in detail with the patient. Please refer to After Visit Summary for other counseling recommendations.   Return in about 1 week (around 03/14/2021) for Routine Prenatal Appointment.   Thomasene Mohair, MD,  [facility], [facility] 03/07/2021 2:04 PM

## 2021-03-14 ENCOUNTER — Ambulatory Visit (INDEPENDENT_AMBULATORY_CARE_PROVIDER_SITE_OTHER): Payer: Medicaid Other | Admitting: Obstetrics and Gynecology

## 2021-03-14 ENCOUNTER — Other Ambulatory Visit (HOSPITAL_COMMUNITY)
Admission: RE | Admit: 2021-03-14 | Discharge: 2021-03-14 | Disposition: A | Discharge status: Home / Self Care | Payer: Medicaid Other | Source: Ambulatory Visit | Attending: Obstetrics and Gynecology | Admitting: Obstetrics and Gynecology

## 2021-03-14 ENCOUNTER — Other Ambulatory Visit: Payer: Self-pay

## 2021-03-14 ENCOUNTER — Encounter: Payer: Self-pay | Admitting: Obstetrics and Gynecology

## 2021-03-14 VITALS — BP 120/70 | Ht 60.0 in | Wt 137.2 lb

## 2021-03-14 DIAGNOSIS — Z3A36 36 weeks gestation of pregnancy: Secondary | ICD-10-CM

## 2021-03-14 DIAGNOSIS — O0993 Supervision of high risk pregnancy, unspecified, third trimester: Secondary | ICD-10-CM | POA: Diagnosis present

## 2021-03-14 LAB — OB RESULTS CONSOLE GC/CHLAMYDIA: Gonorrhea: NEGATIVE

## 2021-03-14 NOTE — Progress Notes (Signed)
Routine Prenatal Care Visit  Subjective  Candice Villarreal is a 20 y.o. G1P0 at [redacted]w[redacted]d being seen today for ongoing prenatal care.  She is currently monitored for the following issues for this high-risk pregnancy and has Tobacco use disorder; PFO (patent foramen ovale); Numbness and tingling of right side of face; TIA (transient ischemic attack); and Supervision of high risk pregnancy, antepartum on their problem list.  ----------------------------------------------------------------------------------- Patient reports no complaints.   Contractions: Not present. Vag. Bleeding: None.  Movement: Present. Denies leaking of fluid.  ----------------------------------------------------------------------------------- The following portions of the patient's history were reviewed and updated as appropriate: allergies, current medications, past family history, past medical history, past social history, past surgical history and problem list. Problem list updated.   Objective  Blood pressure 120/70, height 5' (1.524 m), weight 137 lb 3.2 oz (62.2 kg), last menstrual period 07/02/2020. Pregravid weight 106 lb (48.1 kg) Total Weight Gain 31 lb 3.2 oz (14.2 kg) Urinalysis:      Fetal Status: Fetal Heart Rate (bpm): 160 Fundal Height: 33 cm Movement: Present  Presentation: Vertex  General:  Alert, oriented and cooperative. Patient is in no acute distress.  Skin: Skin is warm and dry. No rash noted.   Cardiovascular: Normal heart rate noted  Respiratory: Normal respiratory effort, no problems with respiration noted  Abdomen: Soft, gravid, appropriate for gestational age. Pain/Pressure: Present     Pelvic:  Cervical exam performed Dilation: 1 Effacement (%): 70 Station: -3  Extremities: Normal range of motion.  Edema: None  Mental Status: Normal mood and affect. Normal behavior. Normal judgment and thought content.     Assessment   20 y.o. G1P0 at [redacted]w[redacted]d by  04/08/2021, by Last Menstrual Period  presenting for routine prenatal visit  Plan   pregnancy1 Problems (from 07/02/20 to present)     Problem Noted Resolved   Supervision of high risk pregnancy, antepartum 08/25/2020 by Zipporah Plants, CNM No   Overview Addendum 01/31/2021 11:22 AM by Natale Milch, MD     Nursing Staff Provider  Office Location  [facility] Dating   LMP = 7w [place]  Language  English Anatomy US   MFM - wnl  Flu Vaccine   declined Genetic Screen  NIPS:   Neg x3, XY  TDaP vaccine   01/31/2021 Hgb A1C or  GTT Early : n/a Third trimester : 115  Rhogam   n/a   LAB RESULTS   Feeding Plan  breast Blood Type O/Positive/-- (12/16 1125)   Contraception  unsure Antibody Negative (12/16 1125)  Circumcision  Rubella 1.76 (12/16 1125)  Pediatrician   RPR Non Reactive (12/16 1125)   Support Person  BF HBsAg Negative (12/16 1125)   Prenatal Classes  resources discussed HIV Non Reactive (12/16 1125)    Varicella  immune  BTL Consent  n/a GBS  (For PCN allergy, check sensitivities)        VBAC Consent  n/a Pap  n/a (<21 yo)    Hgb Electro   n/a    CF      SMA          Recommendations from 8 week visit with MFM regarding hx of PFO:  -MFM fetal anatomy scan.  -Fetal echocardiography at 20 weeks.  -Low-molecular weight heparin 40 mg subcutaneously daily  from now till 6 weeks postpartum. Patient was encouraged on  the benefit of taking heparin prophylaxis. 10/20/20- Pt refuses to take the Lovenox and expressess understanding but lack of concern for blood clot risk  -  Continue low-dose aspirin.   -Consider vaginal delivery.  -Follow-up with cardiologist at least once in this pregnancy. (01/2021 planned)           Discussed cardiology follow up. She reports her cardiologist wanted to see her after pregnancy.  Encouraged to keep growth [place] for 03/17/2021  GBS/ GC today  NST for initial fetal tachycardia to 180s NST: 150 bpm baseline, moderate variability, 15x15 accelerations, no decelerations  Gestational age  appropriate obstetric precautions including but not limited to vaginal bleeding, contractions, leaking of fluid and fetal movement were reviewed in detail with the patient.    Return in about 1 week (around 03/21/2021) for HROB in person weekly for 4 weeks with MD.  Natale Milch MD [facility], [facility] 03/14/2021, 5:01 PM

## 2021-03-16 ENCOUNTER — Other Ambulatory Visit: Payer: Self-pay | Admitting: Obstetrics and Gynecology

## 2021-03-16 ENCOUNTER — Other Ambulatory Visit: Payer: Self-pay | Admitting: Maternal & Fetal Medicine

## 2021-03-16 DIAGNOSIS — Z8673 Personal history of transient ischemic attack (TIA), and cerebral infarction without residual deficits: Secondary | ICD-10-CM

## 2021-03-16 DIAGNOSIS — Z3A36 36 weeks gestation of pregnancy: Secondary | ICD-10-CM

## 2021-03-16 DIAGNOSIS — O0993 Supervision of high risk pregnancy, unspecified, third trimester: Secondary | ICD-10-CM

## 2021-03-16 LAB — CERVICOVAGINAL ANCILLARY ONLY
Chlamydia: NEGATIVE
Comment: NEGATIVE
Comment: NEGATIVE
Comment: NORMAL
Neisseria Gonorrhea: NEGATIVE
Trichomonas: NEGATIVE

## 2021-03-17 ENCOUNTER — Ambulatory Visit: Payer: Medicaid Other | Attending: Maternal & Fetal Medicine

## 2021-03-17 ENCOUNTER — Other Ambulatory Visit: Payer: Self-pay

## 2021-03-17 VITALS — BP 120/66 | HR 94 | Temp 98.6°F | Resp 18 | Ht 60.0 in | Wt 136.0 lb

## 2021-03-17 DIAGNOSIS — Z7722 Contact with and (suspected) exposure to environmental tobacco smoke (acute) (chronic): Secondary | ICD-10-CM

## 2021-03-17 DIAGNOSIS — Z8673 Personal history of transient ischemic attack (TIA), and cerebral infarction without residual deficits: Secondary | ICD-10-CM | POA: Diagnosis not present

## 2021-03-17 DIAGNOSIS — O0993 Supervision of high risk pregnancy, unspecified, third trimester: Secondary | ICD-10-CM | POA: Diagnosis not present

## 2021-03-17 DIAGNOSIS — Z3A36 36 weeks gestation of pregnancy: Secondary | ICD-10-CM | POA: Insufficient documentation

## 2021-03-17 DIAGNOSIS — O0990 Supervision of high risk pregnancy, unspecified, unspecified trimester: Secondary | ICD-10-CM

## 2021-03-17 DIAGNOSIS — O99891 Other specified diseases and conditions complicating pregnancy: Secondary | ICD-10-CM

## 2021-03-18 LAB — CULTURE, BETA STREP (GROUP B ONLY): Strep Gp B Culture: NEGATIVE

## 2021-03-21 ENCOUNTER — Ambulatory Visit (INDEPENDENT_AMBULATORY_CARE_PROVIDER_SITE_OTHER): Payer: Medicaid Other | Admitting: Obstetrics and Gynecology

## 2021-03-21 ENCOUNTER — Other Ambulatory Visit: Payer: Self-pay

## 2021-03-21 VITALS — BP 118/72 | Wt 137.0 lb

## 2021-03-21 DIAGNOSIS — O0990 Supervision of high risk pregnancy, unspecified, unspecified trimester: Secondary | ICD-10-CM

## 2021-03-21 DIAGNOSIS — Z3A37 37 weeks gestation of pregnancy: Secondary | ICD-10-CM

## 2021-03-21 LAB — POCT URINALYSIS DIPSTICK OB
Glucose, UA: NEGATIVE
POC,PROTEIN,UA: NEGATIVE

## 2021-03-21 NOTE — Progress Notes (Signed)
Routine Prenatal Care Visit  Subjective  Candice Villarreal is a 20 y.o. G1P0 at [redacted]w[redacted]d being seen today for ongoing prenatal care.  She is currently monitored for the following issues for this high-risk pregnancy and has Tobacco use disorder; PFO (patent foramen ovale); Numbness and tingling of right side of face; TIA (transient ischemic attack); and Supervision of high risk pregnancy, antepartum on their problem list.  ----------------------------------------------------------------------------------- Patient reports no complaints.   Contractions: Irregular. Vag. Bleeding: None.  Movement: Present. Denies leaking of fluid.  ----------------------------------------------------------------------------------- The following portions of the patient's history were reviewed and updated as appropriate: allergies, current medications, past family history, past medical history, past social history, past surgical history and problem list. Problem list updated.   Objective  Blood pressure 118/72, weight 137 lb (62.1 kg), last menstrual period 07/02/2020. Pregravid weight 106 lb (48.1 kg) Total Weight Gain 31 lb (14.1 kg) Urinalysis:      Fetal Status: Fetal Heart Rate (bpm): 145 Fundal Height: 35 cm Movement: Present  Presentation: Vertex  General:  Alert, oriented and cooperative. Patient is in no acute distress.  Skin: Skin is warm and dry. No rash noted.   Cardiovascular: Normal heart rate noted  Respiratory: Normal respiratory effort, no problems with respiration noted  Abdomen: Soft, gravid, appropriate for gestational age. Pain/Pressure: Present     Pelvic:  Cervical exam deferred Dilation: 1 Effacement (%): 70 Station: -3  Extremities: Normal range of motion.  Edema: None  ental Status: Normal mood and affect. Normal behavior. Normal judgment and thought content.     Assessment   20 y.o. G1P0 at [redacted]w[redacted]d by  04/08/2021, by Last Menstrual Period presenting for routine prenatal visit  Plan    pregnancy1 Problems (from 07/02/20 to present)     Problem Noted Resolved   Supervision of high risk pregnancy, antepartum 08/25/2020 by Zipporah Plants, CNM No   Overview Addendum 01/31/2021 11:22 AM by Natale Milch, MD     Nursing Staff Provider  Office Location  [facility] Dating   LMP = 7w [place]  Language  English Anatomy US   MFM - wnl  Flu Vaccine   declined Genetic Screen  NIPS:   Neg x3, XY  TDaP vaccine   01/31/2021 Hgb A1C or  GTT Early : n/a Third trimester : 115  Rhogam   n/a   LAB RESULTS   Feeding Plan  breast Blood Type O/Positive/-- (12/16 1125)   Contraception  unsure Antibody Negative (12/16 1125)  Circumcision  Rubella 1.76 (12/16 1125)  Pediatrician   RPR Non Reactive (12/16 1125)   Support Person  BF HBsAg Negative (12/16 1125)   Prenatal Classes  resources discussed HIV Non Reactive (12/16 1125)    Varicella  immune  BTL Consent  n/a GBS  (For PCN allergy, check sensitivities)        VBAC Consent  n/a Pap  n/a (<21 yo)    Hgb Electro   n/a    CF      SMA          Recommendations from 8 week visit with MFM regarding hx of PFO:  -MFM fetal anatomy scan.  -Fetal echocardiography at 20 weeks.  -Low-molecular weight heparin 40 mg subcutaneously daily  from now till 6 weeks postpartum. Patient was encouraged on  the benefit of taking heparin prophylaxis. 10/20/20- Pt refuses to take the Lovenox and expressess understanding but lack of concern for blood clot risk  -Continue low-dose aspirin.   -Consider vaginal delivery.  -  Follow-up with cardiologist at least once in this pregnancy. (01/2021 planned)           Gestational age appropriate obstetric precautions including but not limited to vaginal bleeding, contractions, leaking of fluid and fetal movement were reviewed in detail with the patient.    Return in about 1 week (around 03/28/2021) for ROB.  Vena Austria, MD, [facility], [facility] 03/21/2021, 2:12 PM

## 2021-03-21 NOTE — Progress Notes (Signed)
ROB - Request cervical check. RM 4

## 2021-03-24 NOTE — Progress Notes (Signed)
Appointment cancelled

## 2021-03-28 ENCOUNTER — Other Ambulatory Visit: Payer: Self-pay

## 2021-03-28 ENCOUNTER — Ambulatory Visit (INDEPENDENT_AMBULATORY_CARE_PROVIDER_SITE_OTHER): Payer: Medicaid Other | Admitting: Obstetrics & Gynecology

## 2021-03-28 ENCOUNTER — Encounter: Payer: Self-pay | Admitting: Obstetrics & Gynecology

## 2021-03-28 VITALS — BP 100/70 | Wt 134.0 lb

## 2021-03-28 DIAGNOSIS — Z3A38 38 weeks gestation of pregnancy: Secondary | ICD-10-CM

## 2021-03-28 DIAGNOSIS — Q2112 Patent foramen ovale: Secondary | ICD-10-CM

## 2021-03-28 DIAGNOSIS — O0993 Supervision of high risk pregnancy, unspecified, third trimester: Secondary | ICD-10-CM

## 2021-03-28 DIAGNOSIS — Q211 Atrial septal defect: Secondary | ICD-10-CM

## 2021-03-28 NOTE — Progress Notes (Signed)
  Subjective  Fetal Movement? yes Contractions? no Leaking Fluid? no Vaginal Bleeding? no  Objective  BP 100/70   Wt 134 lb (60.8 kg)   LMP 07/02/2020   BMI 26.17 kg/m  General: NAD Pumonary: no increased work of breathing Abdomen: gravid, non-tender Extremities: no edema Psychiatric: mood appropriate, affect full  Assessment  20 y.o. G1P0 at [redacted]w[redacted]d by  04/08/2021, by Last Menstrual Period presenting for routine prenatal visit  Plan   Problem List Items Addressed This Visit      Cardiovascular and Mediastinum   PFO (patent foramen ovale)     Other   Supervision of high risk pregnancy, antepartum - Primary  Other Visit Diagnoses    [redacted] weeks gestation of pregnancy        Breast feeding POP Labor precautions PNV, FMC Exam and discussion for post dates IOL nv  pregnancy1 Problems (from 07/02/20 to present)    Problem Noted Resolved   Supervision of high risk pregnancy, antepartum 08/25/2020 by Zipporah Plants, CNM No   Overview Addendum 01/31/2021 11:22 AM by Natale Milch, MD     Nursing Staff Provider  Office Location  [facility] Dating   LMP = 7w [place]  Language  English Anatomy US   MFM - wnl  Flu Vaccine   declined Genetic Screen  NIPS:   Neg x3, XY  TDaP vaccine   01/31/2021 Hgb A1C or  GTT Early : n/a Third trimester : 115  Rhogam   n/a   LAB RESULTS   Feeding Plan  breast Blood Type O/Positive/-- (12/16 1125)   Contraception  unsure Antibody Negative (12/16 1125)  Circumcision  Rubella 1.76 (12/16 1125)  Pediatrician   RPR Non Reactive (12/16 1125)   Support Person  BF HBsAg Negative (12/16 1125)   Prenatal Classes  resources discussed HIV Non Reactive (12/16 1125)    Varicella  immune  BTL Consent  n/a GBS  (For PCN allergy, check sensitivities)        VBAC Consent  n/a Pap  n/a (<21 yo)    Hgb Electro   n/a    CF      SMA           Recommendations from 8 week visit with MFM regarding hx of PFO:  -MFM fetal anatomy scan.  -Fetal echocardiography  at 20 weeks.  -Low-molecular weight heparin 40 mg subcutaneously daily  from now till 6 weeks postpartum. Patient was encouraged on  the benefit of taking heparin prophylaxis. 10/20/20- Pt refuses to take the Lovenox and expressess understanding but lack of concern for blood clot risk  -Continue low-dose aspirin.   -Consider vaginal delivery.  -Follow-up with cardiologist at least once in this pregnancy. (01/2021 planned)          Annamarie Major, MD, [facility], [facility] 03/28/2021  4:39 PM

## 2021-03-28 NOTE — Patient Instructions (Signed)
First Stage of Labor Labor is your body's natural process of moving your baby and other structures, including the placenta and umbilical cord, out of your uterus. There are three stages of labor. How long each stage lasts is different for every woman. But certain events happen during each stage that are the same for everyone. The first stage starts when true labor begins. This stage ends when your cervix, which is the opening from your uterus into your vagina, is completely open (dilated). The second stage begins when your cervix is fully dilated and you start pushing. This stage ends when your baby is born. The third stage is the delivery of the organ that nourished your baby during pregnancy (placenta). First stage of labor As your due date gets closer, you may start to notice certain physical changes that mean labor is going to start soon. You may feel that your baby has dropped lower into your pelvis. You may experience irregular, often painless, contractions that go away when you walk around or lie down ([employer] contractions). This is also called false labor. The first stage of labor begins when you start having contractions that come at regular (evenly spaced) intervals and your cervix starts to get thinner and wider in preparation for your baby to pass through. Birth care providers measure the dilation of your cervix in centimeters (cm). One centimeter is a little less than one-half of an inch. The first stage ends when your cervix is dilated to 10 cm. The first stage of labor is divided into three phases: Early phase. Active phase. Transitional phase. The length of the first stage of labor varies. It may be longer if this is your first pregnancy. You may spend most of this stage at home trying to relax andstay comfortable. How does this affect me? During the first stage of labor, you will move through three phases. What happens in the early phase? You will start to have regular  contractions that last 30-60 seconds. Contractions may come every 5-20 minutes. Keep track of your contractions and call your birth care provider. Your water may break during this phase. You may notice a clear or slightly bloody discharge of mucus (mucus plug) from your vagina. Your cervix will dilate to 3-6 cm. What happens in the active phase? The active phase usually lasts 3-5 hours. You may go to the hospital or birth center around this time. During the active phase: Your contractions will become stronger, longer, and more uncomfortable. Your contractions may last 45-90 seconds and come every 3-5 minutes. You may feel lower back pain. Your birth care providers may examine your cervix and feel your belly to find the position of your baby. You may have a monitor strapped to your belly to measure your contractions and your baby's heart rate. You may start using your pain management options. Your cervix may be dilated to 6 cm and may start to dilate more quickly. What happens in the transitional phase? The transitional phase typically lasts from 30 minutes to 2 hours. At the end of this phase, your cervix will be fully dilated to 10 cm. During the transitional phase: Contractions will get stronger and longer. Contractions may last 60-90 seconds and come less than 2 minutes apart. You may feel hot flashes, chills, or nausea. How does this affect my baby? During the first stage of labor, your baby will gradually move down into yourbirth canal. Follow these instructions at home and in the hospital or birth center:  When labor  When labor first begins, try to stay calm. You are still in the early phase. If it is night, try to get some sleep. If it is day, try to relax and save your energy. You may want to make some calls and get ready to go to the hospital or birth center. When you are in the early phase, try these methods to help ease discomfort: Deep breathing and muscle relaxation. Taking a  walk. Taking a warm bath or shower. Drink some fluids and have a light snack if you feel like it. Keep track of your contractions. Based on the plan you created with your birth care provider, call when your contractions indicate it is time. If your water breaks, note the time, color, and odor of the fluid. When you are in the active phase, do your breathing exercises and rely on your support people and your team of birth care providers. Contact a health care provider if: Your contractions are strong and regular. You have lower back pain or cramping. Your water breaks. You lose your mucus plug. Get help right away if you: Have a severe headache that does not go away. Have changes in your vision. Have severe pain in your upper belly. Do not feel the baby move. Have bright red bleeding. Summary The first stage of labor starts when true labor begins, and it ends when your cervix is dilated to 10 cm. The first stage of labor has three phases: early, active, and transitional. Your baby moves into the birth canal during the first stage of labor. You may have contractions that become stronger and longer. You may also lose your mucus plug and have your water break. Call your birth care provider when your contractions are frequent and strong enough to go to the hospital or birth center. This information is not intended to replace advice given to you by your health care provider. Make sure you discuss any questions you have with your health care provider. Document Revised: 12/05/2018 Document Reviewed: 10/28/2017 Elsevier Patient Education  2022 Elsevier Inc.  

## 2021-04-04 ENCOUNTER — Ambulatory Visit (INDEPENDENT_AMBULATORY_CARE_PROVIDER_SITE_OTHER): Payer: Medicaid Other | Admitting: Obstetrics & Gynecology

## 2021-04-04 ENCOUNTER — Encounter: Payer: Self-pay | Admitting: Obstetrics & Gynecology

## 2021-04-04 ENCOUNTER — Other Ambulatory Visit: Payer: Self-pay

## 2021-04-04 VITALS — BP 120/80 | Wt 133.0 lb

## 2021-04-04 DIAGNOSIS — Z3A39 39 weeks gestation of pregnancy: Secondary | ICD-10-CM

## 2021-04-04 DIAGNOSIS — Q2112 Patent foramen ovale: Secondary | ICD-10-CM

## 2021-04-04 DIAGNOSIS — O0993 Supervision of high risk pregnancy, unspecified, third trimester: Secondary | ICD-10-CM

## 2021-04-04 DIAGNOSIS — Q211 Atrial septal defect: Secondary | ICD-10-CM

## 2021-04-04 NOTE — Progress Notes (Signed)
  [facility] REGIONAL BIRTHPLACE INDUCTION ASSESSMENT Candice Villarreal 2001-07-03 Medical record #: [id number] Phone #:  Home Phone [phone]  Mobile [phone]    [facility]atal Provider:[facility] Delivering Group:[facility] Proposed admission date/time:04/11/21 0800 Method of induction:Pitocin  Weight: Filed Weights08/08/22 1542Weight:133 lb (60.3 kg) BMI Body mass index is 25.97 kg/m. HIV Negative HSV Negative EDC Estimated Date of Delivery: 8/12/22based on:LMP  Gestational age on admission: 40+ Gravidity/parity:G1P0  Cervix Score   0 1 2 3   Position Post     Consistency   Soft   Effacement (%)    >80  Dilation (cm)  2    Baby's station  -2     Bishop Score:7   Medical induction of labor  select indication(s) below Elective induction ?39 weeks multiparous patient ?39 weeks primiparous patient with Bishop score ?7 ?40 weeks primiparous patient   Medical Indications Adapted from ACOG Committee Opinion #560, "Medically Indicated Late Preterm and Early Term Deliveries," 2013.  PLACENTAL / UTERINE ISSUES FETAL ISSUES MATERNAL ISSUES  Postdates ? (41 weeks)    Provider Signature: 2014 Scheduled by:Candice Villarreal Date:04/04/2021 4:19 PM   Call [phone] to finalize the induction date/time  [phone] (07/17)

## 2021-04-04 NOTE — Progress Notes (Signed)
History and Physical  Candice Villarreal is a 20 y.o. G1P0 [redacted]w[redacted]d  for Induction of Labor scheduled due to Postdates and Favorable cervix at term .   See labor record for pregnancy highlights.  No recent pain, bleeding, ruptured membranes, or other signs of progressing labor.  PMHx: She  has a past medical history of Acute appendicitis (01/28/2019), Expressive aphasia (10/19/2019), Numbness and tingling of right face (10/19/2019), Numbness and tingling of right side of face (10/20/2019), PFO (patent foramen ovale) (10/20/2019), Right arm weakness (10/19/2019), TIA (transient ischemic attack), and Tobacco use disorder (10/19/2019). Also,  has a past surgical history that includes laparoscopic appendectomy (N/A, 01/28/2019); TEE without cardioversion (N/A, 10/21/2019); PATENT FORAMEN OVALE(PFO) CLOSURE (N/A, 02/25/2020); and Appendectomy., family history includes Breast cancer (age of onset: 59) in her maternal grandmother; Cancer in her maternal grandfather and maternal grandmother; Colon cancer (age of onset: 26) in her maternal grandfather; Hyperlipidemia in her mother; Hypertension in her mother.,  reports that she quit smoking about 16 months ago. Her smoking use included cigarettes. She has never used smokeless tobacco. She reports previous alcohol use. She reports previous drug use. Drug: Marijuana. She has a current medication list which includes the following prescription(s): aspirin ec, enoxaparin, and multivitamin-prenatal. Also, has No Known Allergies. OB History  Gravida Para Term Preterm AB Living  1            SAB IAB Ectopic Multiple Live Births               # Outcome Date GA Lbr Len/2nd Weight Sex Delivery Anes PTL Lv  1 Current           Patient denies any other pertinent gynecologic issues.   Review of Systems  Constitutional:  Negative for chills, fever and malaise/fatigue.  HENT:  Negative for congestion, sinus pain and sore throat.   Eyes:  Negative for blurred vision and pain.   Respiratory:  Negative for cough and wheezing.   Cardiovascular:  Negative for chest pain and leg swelling.  Gastrointestinal:  Negative for abdominal pain, constipation, diarrhea, heartburn, nausea and vomiting.  Genitourinary:  Negative for dysuria, frequency, hematuria and urgency.  Musculoskeletal:  Negative for back pain, joint pain, myalgias and neck pain.  Skin:  Negative for itching and rash.  Neurological:  Negative for dizziness, tremors and weakness.  Endo/Heme/Allergies:  Does not bruise/bleed easily.  Psychiatric/Behavioral:  Negative for depression. The patient is not nervous/anxious and does not have insomnia.    Objective: BP 120/80   Wt 133 lb (60.3 kg)   LMP 07/02/2020   BMI 25.97 kg/m  Physical Exam Constitutional:      General: She is not in acute distress.    Appearance: She is well-developed.  Genitourinary:     Bladder, rectum and urethral meatus normal.     No lesions in the vagina.     Right Labia: No rash, tenderness or lesions.    Left Labia: No tenderness, lesions or rash.    No vaginal bleeding.      Right Adnexa: not tender and no mass present.    Left Adnexa: not tender and no mass present.    No cervical motion tenderness, friability, lesion or polyp.     Cervical exam comments: 2/80/-2.     Uterus is not enlarged.     No uterine mass detected.    Pelvic exam was performed with patient in the lithotomy position.  Breasts:    Right: No mass, skin change or tenderness.  Left: No mass, skin change or tenderness.  HENT:     Head: Normocephalic and atraumatic. No laceration.     Right Ear: Hearing normal.     Left Ear: Hearing normal.     Mouth/Throat:     Pharynx: Uvula midline.  Eyes:     Pupils: Pupils are equal, round, and reactive to light.  Neck:     Thyroid: No thyromegaly.  Cardiovascular:     Rate and Rhythm: Normal rate and regular rhythm.     Heart sounds: No murmur heard.   No friction rub. No gallop.  Pulmonary:      Effort: Pulmonary effort is normal. No respiratory distress.     Breath sounds: Normal breath sounds. No wheezing.  Abdominal:     General: Bowel sounds are normal. There is no distension.     Palpations: Abdomen is soft.     Tenderness: There is no abdominal tenderness. There is no rebound.     Comments: Vtx FHT 140s  Musculoskeletal:        General: Normal range of motion.     Cervical back: Normal range of motion and neck supple.  Neurological:     Mental Status: She is alert and oriented to person, place, and time.     Cranial Nerves: No cranial nerve deficit.  Skin:    General: Skin is warm and dry.  Psychiatric:        Judgment: Judgment normal.  Vitals reviewed.    Assessment: Term Pregnancy for Induction of Labor due to Postdates and Favorable cervix at term.  Plan: Patient will undergo induction of labor with pitocin, amniotomy.     Patient has been fully informed of the pros and cons, risks and benefits of continued observation with fetal monitoring versus that of induction of labor.   She understands that there are uncommon risks to induction, which include but are not limited to : frequent or prolonged uterine contractions, fetal distress, uterine rupture, and lack of successful induction.  These risks include all methods including Pitocin and Misoprostol and Cervadil.  Patient understands that using Misoprostol for labor induction is an "off label" indication although it has been studied extensively for this purpose and is an accepted method of induction.  She also has been informed of the increased risks for Cesarean with induction and should induction not be successful.  Patient consents to the induction plan of management.  Plans to breast/ feed Plans oral progesterone-only contraceptive for contraception TDaP UTD6/6/22 GBS neg  Annamarie Major, MD, [facility], [facility] 04/04/2021  4:12 PM

## 2021-04-04 NOTE — Patient Instructions (Signed)
Labor Induction Apr 12, 799  PRE ADMISSION TESTING For Covid, prior to procedure Friday Aug 12 9:00-10:00 [facility] entrance (drive up)  Results in 79-15 hours You will not receive notification if test results are negative. If positive for Covid19, your provider will notify you by phone, with additional instructions.  Labor induction is when steps are taken to cause a pregnant woman to begin the labor process. Most women go into labor on their own between 37 weeks and 42 weeks of pregnancy. When this does not happen, or when there is a medical needfor labor to begin, steps may be taken to induce, or bring on, labor. Labor induction causes a pregnant woman's uterus to contract. It also causes the cervix to soften (ripen), open (dilate), and thin out. Usually, labor is not induced before 39 weeks of pregnancyunless there is a medical reason to do so. When is labor induction considered? Labor induction may be right for you if: Your pregnancy lasts longer than 41 to 42 weeks. Your placenta is separating from your uterus (placental abruption). You have a rupture of membranes and your labor does not begin. You have health problems, like diabetes or high blood pressure (preeclampsia) during your pregnancy. Your baby has stopped growing or does not have enough amniotic fluid. Before labor induction begins, your health care provider will consider the following factors: Your medical condition and the baby's condition. How many weeks you have been pregnant. How mature the baby's lungs are. The condition of your cervix. The position of the baby. The size of your birth canal. Tell a health care provider about: Any allergies you have. All medicines you are taking, including vitamins, herbs, eye drops, creams, and over-the-counter medicines. Any problems you or your family members have had with anesthetic medicines. Any surgeries you have had. Any blood disorders you have. Any medical  conditions you have. What are the risks? Generally, this is a safe procedure. However, problems may occur, including: Failed induction. Changes in fetal heart rate, such as being too high, too low, or irregular (erratic). Infection in the mother or the baby. Increased risk of having a cesarean delivery. Breaking off (abruption) of the placenta from the uterus. This is rare. Rupture of the uterus. This is very rare. Your baby could fail to get enough blood flow or oxygen. This can be life-threatening. When induction is needed for medical reasons, the benefits generally outweighthe risks. What happens during the procedure? During the procedure, your health care provider will use one of these methods to induce labor: Stripping the membranes. In this method, the amniotic sac tissue is gently separated from the cervix. This causes the following to happen: Your cervix stretches, which in turn causes the release of prostaglandins. Prostaglandins induce labor and cause the uterus to contract. This procedure is often done in an office visit. You will be sent home to wait for contractions to begin. Prostaglandin medicine. This medicine starts contractions and causes the cervix to dilate and ripen. This can be taken by mouth (orally) or by being inserted into the vagina (suppository). Inserting a small, thin tube (catheter) with a balloon into the vagina and then expanding the balloon with water to dilate the cervix. Breaking the water. In this method, a small instrument is used to make a small hole in the amniotic sac. This eventually causes the amniotic sac to break. Contractions should begin within a few hours. Medicine to trigger or strengthen contractions. This medicine is given through an IV that is  inserted into a vein in your arm. This procedure may vary among health care providers and hospitals. Where to find more information March of [employer]: www.marchofdimes.org [employer] of  Obstetricians and Gynecologists: www.acog.org Summary Labor induction causes a pregnant woman's uterus to contract. It also causes the cervix to soften (ripen), open (dilate), and thin out. Labor is usually not induced before 39 weeks of pregnancy unless there is a medical reason to do so. When induction is needed for medical reasons, the benefits generally outweigh the risks. Talk with your health care provider about which methods of labor induction are right for you. This information is not intended to replace advice given to you by your health care provider. Make sure you discuss any questions you have with your healthcare provider. Document Revised: 05/27/2020 Document Reviewed: 05/27/2020 Elsevier Patient Education  2022 [employer].

## 2021-04-05 ENCOUNTER — Observation Stay
Admission: EM | Admit: 2021-04-05 | Discharge: 2021-04-06 | Disposition: A | Discharge status: Home / Self Care | Payer: Medicaid Other | Attending: Obstetrics & Gynecology | Admitting: Obstetrics & Gynecology

## 2021-04-05 ENCOUNTER — Encounter: Payer: Self-pay | Admitting: Obstetrics & Gynecology

## 2021-04-05 DIAGNOSIS — O26893 Other specified pregnancy related conditions, third trimester: Principal | ICD-10-CM | POA: Insufficient documentation

## 2021-04-05 DIAGNOSIS — R1030 Lower abdominal pain, unspecified: Secondary | ICD-10-CM | POA: Diagnosis not present

## 2021-04-05 DIAGNOSIS — Z3A40 40 weeks gestation of pregnancy: Secondary | ICD-10-CM | POA: Diagnosis not present

## 2021-04-05 DIAGNOSIS — O480 Post-term pregnancy: Secondary | ICD-10-CM | POA: Diagnosis present

## 2021-04-05 MED ORDER — BUTORPHANOL TARTRATE 1 MG/ML IJ SOLN
1.0000 mg | INTRAMUSCULAR | Status: DC | PRN
  Administered 2021-04-05 – 2021-04-06 (×2): 1 mg via INTRAVENOUS
  Filled 2021-04-05 (×2): qty 1

## 2021-04-05 MED ORDER — ACETAMINOPHEN 325 MG PO TABS: 650.0000 mg | ORAL_TABLET | ORAL | Status: DC | PRN

## 2021-04-05 MED ORDER — OXYTOCIN-SODIUM CHLORIDE 30-0.9 UT/500ML-% IV SOLN: 1.0000 m[IU]/min | INTRAVENOUS | Status: DC

## 2021-04-05 MED ORDER — ONDANSETRON HCL 4 MG/2ML IJ SOLN
4.0000 mg | Freq: Four times a day (QID) | INTRAMUSCULAR | Status: DC | PRN
  Administered 2021-04-05: 4 mg via INTRAVENOUS
  Filled 2021-04-05: qty 2

## 2021-04-05 MED ORDER — OXYTOCIN-SODIUM CHLORIDE 30-0.9 UT/500ML-% IV SOLN: 2.5000 [IU]/h | INTRAVENOUS | Status: DC

## 2021-04-05 MED ORDER — TERBUTALINE SULFATE 1 MG/ML IJ SOLN: 0.2500 mg | Freq: Once | INTRAMUSCULAR | Status: DC | PRN

## 2021-04-05 MED ORDER — LACTATED RINGERS IV SOLN
500.0000 mL | INTRAVENOUS | Status: DC | PRN
Start: 2021-04-05 — End: 2021-04-06
  Administered 2021-04-05: 500 mL via INTRAVENOUS

## 2021-04-05 MED ORDER — LIDOCAINE HCL (PF) 1 % IJ SOLN: 30.0000 mL | INTRAMUSCULAR | Status: DC | PRN

## 2021-04-05 MED ORDER — OXYTOCIN BOLUS FROM INFUSION: 333.0000 mL | Freq: Once | INTRAVENOUS | Status: DC

## 2021-04-05 MED ORDER — LACTATED RINGERS IV SOLN: INTRAVENOUS | Status: DC

## 2021-04-05 NOTE — OB Triage Note (Signed)
Pt arrived to [facility] triage with complaints of contractions. Patient stated she has been contracting since earlier to day that have worsened in intensity. Patient reports she had her membranes stripped yesterday at [facility] outpatient visit. Patient is a G1P0 [redacted]w[redacted]d. Patient placed on EFM monitor and TOCO to non tender area of abdomen. Patient denies active vaginal bleeding or ruptured membranes. Will notify provider on call and triage patient for r/o labor.

## 2021-04-05 NOTE — OB Triage Note (Signed)
Paged Tiburcio Pea, MD awaiting call back.

## 2021-04-05 NOTE — OB Triage Note (Signed)
Dr. Tiburcio Pea called and SBAR given including no change in cervical status. Patient still feels pain after walking. MD stated he released admission orders in the event she changes her cervix but continue to monitor patient and if no changes will re-evaluate disposition. MD stated she can have an IV for pain meds and fluids. See MAR and orders. Will notify patient on plan of care.

## 2021-04-05 NOTE — OB Triage Note (Signed)
Dr. Tiburcio Pea gave verbal orders to continue to evaluate patient for labor and notify him of any changes in 2 hours.

## 2021-04-06 DIAGNOSIS — O480 Post-term pregnancy: Secondary | ICD-10-CM

## 2021-04-06 NOTE — Progress Notes (Signed)
Notified Dr. Tiburcio Pea by phone that patient has had no change in cervical exam and patient is sleeping in room after 2 doses of IV stadol. MD stated patient can be discharged to home and keep her induction date and return if signs of labor progress.Will notify patient on plan of care. Patient to be discharged with labor precautions.

## 2021-04-07 ENCOUNTER — Other Ambulatory Visit: Payer: Self-pay

## 2021-04-07 ENCOUNTER — Inpatient Hospital Stay
Admission: EM | Admit: 2021-04-07 | Discharge: 2021-04-09 | DRG: 807 | Disposition: A | Discharge status: Home / Self Care | Payer: Medicaid Other | Attending: Obstetrics | Admitting: Obstetrics

## 2021-04-07 ENCOUNTER — Inpatient Hospital Stay: Payer: Medicaid Other | Admitting: Anesthesiology

## 2021-04-07 ENCOUNTER — Encounter: Payer: Self-pay | Admitting: Obstetrics and Gynecology

## 2021-04-07 DIAGNOSIS — O479 False labor, unspecified: Secondary | ICD-10-CM | POA: Diagnosis present

## 2021-04-07 DIAGNOSIS — O0990 Supervision of high risk pregnancy, unspecified, unspecified trimester: Secondary | ICD-10-CM

## 2021-04-07 DIAGNOSIS — Z20822 Contact with and (suspected) exposure to covid-19: Secondary | ICD-10-CM | POA: Diagnosis present

## 2021-04-07 DIAGNOSIS — Z87891 Personal history of nicotine dependence: Secondary | ICD-10-CM

## 2021-04-07 DIAGNOSIS — O26893 Other specified pregnancy related conditions, third trimester: Secondary | ICD-10-CM | POA: Diagnosis present

## 2021-04-07 DIAGNOSIS — Z3A39 39 weeks gestation of pregnancy: Secondary | ICD-10-CM | POA: Diagnosis not present

## 2021-04-07 DIAGNOSIS — Z7982 Long term (current) use of aspirin: Secondary | ICD-10-CM

## 2021-04-07 LAB — CBC
HCT: 32.1 % — ABNORMAL LOW (ref 36.0–46.0)
Hemoglobin: 10.7 g/dL — ABNORMAL LOW (ref 12.0–15.0)
MCH: 27.5 pg (ref 26.0–34.0)
MCHC: 33.3 g/dL (ref 30.0–36.0)
MCV: 82.5 fL (ref 80.0–100.0)
Platelets: 324 10*3/uL (ref 150–400)
RBC: 3.89 MIL/uL (ref 3.87–5.11)
RDW: 13.1 % (ref 11.5–15.5)
WBC: 15.2 10*3/uL — ABNORMAL HIGH (ref 4.0–10.5)
nRBC: 0 % (ref 0.0–0.2)

## 2021-04-07 LAB — RESP PANEL BY RT-PCR (FLU A&B, COVID) ARPGX2
Influenza A by PCR: NEGATIVE
Influenza B by PCR: NEGATIVE
SARS Coronavirus 2 by RT PCR: NEGATIVE

## 2021-04-07 LAB — RPR: RPR Ser Ql: NONREACTIVE

## 2021-04-07 LAB — ABO/RH: ABO/RH(D): O POS

## 2021-04-07 LAB — TYPE AND SCREEN
ABO/RH(D): O POS
Antibody Screen: NEGATIVE

## 2021-04-07 MED ORDER — ONDANSETRON HCL 4 MG/2ML IJ SOLN: 4.0000 mg | INTRAMUSCULAR | Status: DC | PRN

## 2021-04-07 MED ORDER — SENNOSIDES-DOCUSATE SODIUM 8.6-50 MG PO TABS
2.0000 | ORAL_TABLET | ORAL | Status: DC
  Administered 2021-04-07 – 2021-04-08 (×2): 2 via ORAL
  Filled 2021-04-07 (×2): qty 2

## 2021-04-07 MED ORDER — BUTORPHANOL TARTRATE 1 MG/ML IJ SOLN
1.0000 mg | INTRAMUSCULAR | Status: DC | PRN
  Administered 2021-04-07: 1 mg via INTRAVENOUS
  Filled 2021-04-07: qty 1

## 2021-04-07 MED ORDER — MISOPROSTOL 200 MCG PO TABS
ORAL_TABLET | ORAL | Status: AC
  Filled 2021-04-07: qty 4

## 2021-04-07 MED ORDER — PHENYLEPHRINE 40 MCG/ML (10ML) SYRINGE FOR IV PUSH (FOR BLOOD PRESSURE SUPPORT): 80.0000 ug | PREFILLED_SYRINGE | INTRAVENOUS | Status: DC | PRN

## 2021-04-07 MED ORDER — SOD CITRATE-CITRIC ACID 500-334 MG/5ML PO SOLN: 30.0000 mL | ORAL | Status: DC | PRN

## 2021-04-07 MED ORDER — LIDOCAINE HCL (PF) 1 % IJ SOLN
INTRAMUSCULAR | Status: AC
  Filled 2021-04-07: qty 30

## 2021-04-07 MED ORDER — OXYTOCIN-SODIUM CHLORIDE 30-0.9 UT/500ML-% IV SOLN: 2.5000 [IU]/h | INTRAVENOUS | Status: DC

## 2021-04-07 MED ORDER — ONDANSETRON HCL 4 MG PO TABS
4.0000 mg | ORAL_TABLET | ORAL | Status: DC | PRN
  Filled 2021-04-07: qty 1

## 2021-04-07 MED ORDER — DIBUCAINE (PERIANAL) 1 % EX OINT: 1.0000 "application " | TOPICAL_OINTMENT | CUTANEOUS | Status: DC | PRN

## 2021-04-07 MED ORDER — DIPHENHYDRAMINE HCL 50 MG/ML IJ SOLN: 12.5000 mg | INTRAMUSCULAR | Status: DC | PRN

## 2021-04-07 MED ORDER — COCONUT OIL OIL
1.0000 "application " | TOPICAL_OIL | Status: DC | PRN
  Filled 2021-04-07: qty 120

## 2021-04-07 MED ORDER — ACETAMINOPHEN 325 MG PO TABS: 650.0000 mg | ORAL_TABLET | ORAL | Status: DC | PRN

## 2021-04-07 MED ORDER — OXYTOCIN-SODIUM CHLORIDE 30-0.9 UT/500ML-% IV SOLN
INTRAVENOUS | Status: AC
  Filled 2021-04-07: qty 500

## 2021-04-07 MED ORDER — EPHEDRINE 5 MG/ML INJ: 10.0000 mg | INTRAVENOUS | Status: DC | PRN

## 2021-04-07 MED ORDER — LACTATED RINGERS IV SOLN: 500.0000 mL | INTRAVENOUS | Status: DC | PRN

## 2021-04-07 MED ORDER — FENTANYL-BUPIVACAINE-NACL 0.5-0.125-0.9 MG/250ML-% EP SOLN
12.0000 mL/h | EPIDURAL | Status: DC | PRN
  Administered 2021-04-07: 12 mL/h via EPIDURAL

## 2021-04-07 MED ORDER — SIMETHICONE 80 MG PO CHEW: 80.0000 mg | CHEWABLE_TABLET | ORAL | Status: DC | PRN

## 2021-04-07 MED ORDER — ONDANSETRON HCL 4 MG/2ML IJ SOLN
4.0000 mg | Freq: Four times a day (QID) | INTRAMUSCULAR | Status: DC | PRN
  Administered 2021-04-07: 4 mg via INTRAVENOUS

## 2021-04-07 MED ORDER — OXYCODONE-ACETAMINOPHEN 5-325 MG PO TABS: 1.0000 | ORAL_TABLET | ORAL | Status: DC | PRN

## 2021-04-07 MED ORDER — DIPHENHYDRAMINE HCL 25 MG PO CAPS: 25.0000 mg | ORAL_CAPSULE | Freq: Four times a day (QID) | ORAL | Status: DC | PRN

## 2021-04-07 MED ORDER — ZOLPIDEM TARTRATE 5 MG PO TABS: 5.0000 mg | ORAL_TABLET | Freq: Every evening | ORAL | Status: DC | PRN

## 2021-04-07 MED ORDER — OXYTOCIN 10 UNIT/ML IJ SOLN
INTRAMUSCULAR | Status: AC
  Filled 2021-04-07: qty 2

## 2021-04-07 MED ORDER — LACTATED RINGERS IV SOLN
500.0000 mL | Freq: Once | INTRAVENOUS | Status: AC
  Administered 2021-04-07: 500 mL via INTRAVENOUS

## 2021-04-07 MED ORDER — LIDOCAINE-EPINEPHRINE (PF) 1.5 %-1:200000 IJ SOLN
INTRAMUSCULAR | Status: DC | PRN
  Administered 2021-04-07: 4 mL via EPIDURAL

## 2021-04-07 MED ORDER — OXYCODONE-ACETAMINOPHEN 5-325 MG PO TABS: 2.0000 | ORAL_TABLET | ORAL | Status: DC | PRN

## 2021-04-07 MED ORDER — PRENATAL MULTIVITAMIN CH
1.0000 | ORAL_TABLET | Freq: Every day | ORAL | Status: DC
  Administered 2021-04-07 – 2021-04-08 (×2): 1 via ORAL
  Filled 2021-04-07 (×2): qty 1

## 2021-04-07 MED ORDER — BUPIVACAINE HCL (PF) 0.25 % IJ SOLN
INTRAMUSCULAR | Status: DC | PRN
  Administered 2021-04-07 (×2): 4 mL via EPIDURAL

## 2021-04-07 MED ORDER — LIDOCAINE HCL (PF) 1 % IJ SOLN: 30.0000 mL | INTRAMUSCULAR | Status: DC | PRN

## 2021-04-07 MED ORDER — DOCUSATE SODIUM 100 MG PO CAPS
100.0000 mg | ORAL_CAPSULE | Freq: Two times a day (BID) | ORAL | Status: DC
  Administered 2021-04-08 – 2021-04-09 (×3): 100 mg via ORAL
  Filled 2021-04-07 (×3): qty 1

## 2021-04-07 MED ORDER — OXYTOCIN BOLUS FROM INFUSION
333.0000 mL | Freq: Once | INTRAVENOUS | Status: AC
  Administered 2021-04-07: 333 mL via INTRAVENOUS

## 2021-04-07 MED ORDER — ONDANSETRON HCL 4 MG/2ML IJ SOLN
INTRAMUSCULAR | Status: AC
  Filled 2021-04-07: qty 2

## 2021-04-07 MED ORDER — LACTATED RINGERS IV SOLN: INTRAVENOUS | Status: DC

## 2021-04-07 MED ORDER — AMMONIA AROMATIC IN INHA
RESPIRATORY_TRACT | Status: AC
  Filled 2021-04-07: qty 10

## 2021-04-07 MED ORDER — IBUPROFEN 600 MG PO TABS
600.0000 mg | ORAL_TABLET | Freq: Four times a day (QID) | ORAL | Status: DC
  Administered 2021-04-07 – 2021-04-09 (×8): 600 mg via ORAL
  Filled 2021-04-07 (×8): qty 1

## 2021-04-07 MED ORDER — BENZOCAINE-MENTHOL 20-0.5 % EX AERO: 1.0000 "application " | INHALATION_SPRAY | CUTANEOUS | Status: DC | PRN

## 2021-04-07 MED ORDER — WITCH HAZEL-GLYCERIN EX PADS: 1.0000 "application " | MEDICATED_PAD | CUTANEOUS | Status: DC | PRN

## 2021-04-07 MED ORDER — FENTANYL-BUPIVACAINE-NACL 0.5-0.125-0.9 MG/250ML-% EP SOLN
EPIDURAL | Status: AC
  Filled 2021-04-07: qty 250

## 2021-04-07 MED ORDER — LIDOCAINE HCL (PF) 1 % IJ SOLN
INTRAMUSCULAR | Status: DC | PRN
  Administered 2021-04-07: 3 mL via INTRADERMAL

## 2021-04-07 NOTE — Final Progress Note (Signed)
Physician Final Progress Note  Patient ID: Candice Villarreal MRN: [id number] DOB/AGE: 2001/04/29 20 y.o.  Admit date: 04/05/2021 Admitting provider: Nadara Mustard, MD Discharge date: 04/06/21   Admission Diagnoses: Lower abdominal pains 40 weeks pregnancy  Discharge Diagnoses: same  Consults: None  Significant Findings/ Diagnostic Studies: Patient presented for evaluation of labor.  Patient had cervical exam by RN and this was reported to me. I reviewed her vital signs and fetal tracing, both of which were reassuring.  Patient was discharge as she was not laboring.  Procedures: A NST procedure was performed with FHR monitoring and a normal baseline established, appropriate time of 20-40 minutes of evaluation, and accels >2 seen w 15x15 characteristics.  Results show a REACTIVE NST.   Discharge Condition: good  Disposition: Discharge disposition: 01-Home or Self Care       Diet: Regular diet  Discharge Activity: Activity as tolerated   Allergies as of 04/06/2021   No Known Allergies      Medication List     ASK your doctor about these medications    aspirin EC 81 MG tablet Take 1 tablet (81 mg total) by mouth daily. Swallow whole.   multivitamin-prenatal 27-0.8 MG Tabs tablet Take 1 tablet by mouth daily at 12 noon.         Total time spent taking care of this patient: TRIAGE  Signed: Letitia Libra 04/07/2021, 7:52 AM

## 2021-04-07 NOTE — Discharge Summary (Signed)
  See FPN 

## 2021-04-07 NOTE — Anesthesia Preprocedure Evaluation (Signed)
Anesthesia Evaluation  Patient identified by MRN, date of birth, ID band Patient awake    Reviewed: Allergy & Precautions, H&P , NPO status , Patient's Chart, lab work & pertinent test results, reviewed documented beta blocker date and time   Airway Mallampati: II  TM Distance: >3 FB Neck ROM: full    Dental no notable dental hx. (+) Teeth Intact   Pulmonary neg pulmonary ROS, Current Smoker and Patient abstained from smoking., former smoker,    Pulmonary exam normal breath sounds clear to auscultation       Cardiovascular Exercise Tolerance: Good negative cardio ROS   Rhythm:regular Rate:Normal     Neuro/Psych scoliosis TIAnegative psych ROS   GI/Hepatic negative GI ROS, Neg liver ROS,   Endo/Other  negative endocrine ROSdiabetes  Renal/GU      Musculoskeletal   Abdominal   Peds  Hematology negative hematology ROS (+)   Anesthesia Other Findings   Reproductive/Obstetrics (+) Pregnancy                             Anesthesia Physical Anesthesia Plan  ASA: 2  Anesthesia Plan: Epidural   Post-op Pain Management:    Induction:   PONV Risk Score and Plan:   Airway Management Planned:   Additional Equipment:   Intra-op Plan:   Post-operative Plan:   Informed Consent: I have reviewed the patients History and Physical, chart, labs and discussed the procedure including the risks, benefits and alternatives for the proposed anesthesia with the patient or authorized representative who has indicated his/her understanding and acceptance.       Plan Discussed with:   Anesthesia Plan Comments:         Anesthesia Quick Evaluation

## 2021-04-07 NOTE — Anesthesia Procedure Notes (Signed)
Epidural Patient location during procedure: OB  Staffing Anesthesiologist: Piscitello, Cleda Mccreedy, MD Performed: anesthesiologist   Preanesthetic Checklist Completed: patient identified, IV checked, site marked, risks and benefits discussed, surgical consent, monitors and equipment checked, pre-op evaluation and timeout performed  Epidural Patient position: sitting Prep: ChloraPrep Patient monitoring: heart rate, continuous pulse ox and blood pressure Approach: midline Location: L4-L5 Injection technique: LOR saline  Needle:  Needle type: Tuohy  Needle gauge: 17 G Needle length: 9 cm and 9 Needle insertion depth: 6 cm Catheter type: closed end flexible Catheter size: 19 Gauge Catheter at skin depth: 11 cm Test dose: negative and 1.5% lidocaine with Epi 1:200 K  Assessment Sensory level: T10 Events: blood not aspirated, injection not painful, no injection resistance, no paresthesia and negative IV test  Additional Notes 2nd attempt Pt. Evaluated and documentation done after procedure finished. Patient identified. Risks/Benefits/Options discussed with patient including but not limited to bleeding, infection, nerve damage, paralysis, failed block, incomplete pain control, headache, blood pressure changes, nausea, vomiting, reactions to medication both or allergic, itching and postpartum back pain. Confirmed with bedside nurse the patient's most recent platelet count. Confirmed with patient that they are not currently taking any anticoagulation, have any bleeding history or any family history of bleeding disorders. Patient expressed understanding and wished to proceed. All questions were answered. Sterile technique was used throughout the entire procedure. Please see nursing notes for vital signs. Left rotartiscoliosis noted and initital attempt at L5S1 changed to L4L5.Marland Kitchen Test dose was given through epidural catheter and negative prior to continuing to dose epidural or start infusion.  Warning signs of high block given to the patient including shortness of breath, tingling/numbness in hands, complete motor block, or any concerning symptoms with instructions to call for help. Patient was given instructions on fall risk and not to get out of bed. All questions and concerns addressed with instructions to call with any issues or inadequate analgesia.    Patient tolerated the insertion well without immediate complications.Reason for block:procedure for pain

## 2021-04-07 NOTE — H&P (Signed)
Candice Villarreal is a 20 y.o. female  who has been admitted in active labor at term.  She arrived a few hours ago, and was found to be 4-5 cms dialted and contracting regularly. As she was contracting often and was very comfortable, she waws admitted quickly , and anesthesia contacted to place an epidural.  She has recently reported feeling rectal pressure. OB History     Gravida  1   Para      Term      Preterm      AB      Living         SAB      IAB      Ectopic      Multiple      Live Births             Past Medical History:  Diagnosis Date   Acute appendicitis 01/28/2019   Expressive aphasia 10/19/2019   Numbness and tingling of right face 10/19/2019   Numbness and tingling of right side of face 10/20/2019   PFO (patent foramen ovale) 10/20/2019   Right arm weakness 10/19/2019   TIA (transient ischemic attack)    Tobacco use disorder 10/19/2019   Past Surgical History:  Procedure Laterality Date   APPENDECTOMY     LAPAROSCOPIC APPENDECTOMY N/A 01/28/2019   Procedure: APPENDECTOMY LAPAROSCOPIC;  Surgeon: Candice Linsey, MD;  Location: [facility] ORS;  Service: General;  Laterality: N/A;   PATENT FORAMEN OVALE(PFO) CLOSURE N/A 02/25/2020   Procedure: PATENT FORAMEN OVALE (PFO) CLOSURE;  Surgeon: Candice Bollman, MD;  Location: [facility] INVASIVE CV LAB;  Service: Cardiovascular;  Laterality: N/A;   TEE WITHOUT CARDIOVERSION N/A 10/21/2019   Procedure: TRANSESOPHAGEAL ECHOCARDIOGRAM (TEE);  Surgeon: Candice Kendall, MD;  Location: [facility] ORS;  Service: Cardiovascular;  Laterality: N/A;   Family History: family history includes Breast cancer (age of onset: 19) in her maternal grandmother; Cancer in her maternal grandfather and maternal grandmother; Colon cancer (age of onset: 58) in her maternal grandfather; Hyperlipidemia in her mother; Hypertension in her mother. Social History:  reports that she quit smoking about 16 months ago. Her smoking use included cigarettes. She has never  used smokeless tobacco. She reports that she does not currently use alcohol. She reports that she does not currently use drugs after having used the following drugs: Marijuana.     Maternal Diabetes: No Genetic Screening: Normal Maternal Ultrasounds/Referrals: Other: Fetal Ultrasounds or other Referrals:  Referred to Materal Fetal Medicine due to her hx of PFO and stroke in 2021.She has also admitted to tobacco use. Advised by MF to be on daily Lovenox due to this hx , and she has declined to do this. Has been on a daily ASA Maternal Substance Abuse:  Yes:  Type: Smoker Significant Maternal Medications:  Meds include: Other:  Significant Maternal Lab Results:  None and Other: daily ASA Other Comments:  Has been leaking fluid since yesterday at 0200 (meconium)  Review of Systems  Constitutional: Negative.   HENT: Negative.    Eyes: Negative.   Respiratory: Negative.    Cardiovascular: Negative.   Gastrointestinal: Negative.        Gravid abdomen  Endocrine: Negative.   Genitourinary: Negative.   Musculoskeletal: Negative.   Allergic/Immunologic: Negative.   Neurological: Negative.   Hematological: Negative.   Psychiatric/Behavioral: Negative.    History Dilation: 10 Effacement (%): 100 Station: Plus 2 Exam by:: Candice Pall RN Blood pressure 117/65, pulse 79, temperature 98 F (36.7 C), temperature source  Oral, resp. rate 20, height 5' (1.524 m), weight 61.2 kg, last menstrual period 07/02/2020, SpO2 100 %. Maternal Exam:  Uterine Assessment: Contraction strength is firm.  Contraction duration is 70 seconds. Contraction frequency is rare and regular.  Abdomen: Estimated fetal weight is 6 lbs.   Fetal presentation: vertex Introitus: Normal vulva. Normal vagina.  Amniotic fluid character: meconium stained. Pelvis: adequate for delivery.   Cervix: Cervix evaluated by digital exam.    Physical Exam Vitals reviewed.  Constitutional:      Appearance: Normal appearance.  HENT:      Head: Normocephalic and atraumatic.  Genitourinary:    General: Normal vulva.  Musculoskeletal:        General: Normal range of motion.     Cervical back: Normal range of motion and neck supple.  Skin:    General: Skin is warm and dry.  Neurological:     General: No focal deficit present.     Mental Status: She is alert and oriented to person, place, and time.  Psychiatric:        Mood and Affect: Mood normal.        Behavior: Behavior normal.    Prenatal labs: ABO, Rh: --/--/O POS (08/11 0335) Antibody: NEG (08/11 0335) Rubella: 1.76 (12/16 1125) RPR: Non Reactive (05/24 1517)  HBsAg: Negative (12/16 1125)  HIV: Non Reactive (05/24 1517)  GBS: Negative/-- (07/18 1632)   Assessment/Plan: Admitted to Labor and Delivery Routine labs. IV access. Epidural per anethesia Room set up for delivery Anticipate SVD shortly.NBN alerted to the presence of meconium fluid.   Candice Villarreal 04/07/2021, 6:32 AM

## 2021-04-07 NOTE — Lactation Note (Signed)
This note was copied from a baby's chart. Lactation Consultation Note  Patient Name: Candice Villarreal [id number] Date: 04/07/2021 Reason for consult: Follow-up assessment;Mother's request;Primapara;Term Age:21 hours  Lactation follow-up per mom's request. First feeding since feeding following delivery. Mom has compressible tissue and everted nipples (short) however baby is unable to sustain latch for extended period of hand. With hand expression, colostrum is easily expressed and baby eager for feeding. After several minutes and no sustained latch, nipple shield (size 72mm) implemented. Baby accepted shield on /off, colostrum visible in shield, baby swallowing.  Mom's arms repositioned around baby for cross-cradle hold and talked through re-latching. Mom able to demonstrate her ability to independently get baby back to breast. Reviewed washing and air drying of nipple shield post feedings. Encouraged on demand feedings, potential for cluster feeding overnight, hand expression/spoon feeding as alternative feeding methods.  Maternal Data Has patient been taught Hand Expression?: Yes Does the patient have breastfeeding experience prior to this delivery?: No  Feeding Mother's Current Feeding Choice: Breast Milk  LATCH Score Latch: Repeated attempts needed to sustain latch, nipple held in mouth throughout feeding, stimulation needed to elicit sucking reflex.  Audible Swallowing: Spontaneous and intermittent (with nipple shield)  Type of Nipple: Everted at rest and after stimulation (everted but short nipples)  Comfort (Breast/Nipple): Soft / non-tender  Hold (Positioning): Assistance needed to correctly position infant at breast and maintain latch.  LATCH Score: 8   Lactation Tools Discussed/Used Tools: Nipple Shields Nipple shield size: 20  Interventions Interventions: Breast feeding basics reviewed;Assisted with latch;Hand express;Breast compression;Adjust position;Support  pillows;Education (nipple shield)  Discharge    Consult Status Consult Status: Follow-up Date: 04/09/11 Follow-up type: In-patient    Danford Bad 04/07/2021, 4:58 PM

## 2021-04-07 NOTE — Lactation Note (Signed)
This note was copied from a baby's chart. Lactation Consultation Note  Patient Name: Candice Villarreal LOVFI'E Date: 04/07/2021 Reason for consult: Initial assessment;Primapara;Term Age:21 hours  Initial lactation visit. P1 mom delivered vaginally 4 hours ago. One documented feed since delivery with assistance from Transition RN.  Lactation at bedside, mom attempting a feed independently. Baby placed in football hold at R breast. Baby appearing sleepy, relaxed body language, not interested.  LC reviewed with mom BF basics: newborn stomach size, feeding patterns and behaviors, early hunger cues, skin to skin, 8-12 attempts in first 24 hours with options of spoon feeding/hand expression if needed. Mom questioned needs for pumping: encouraged to see how baby does at breast and use of hand expression if needed before pump introduction at this time.  LC encouraged skin to skin with baby throughout today. Whiteboard updated with LC name/number. Encouraged to call with feeding attempts or questions.  Maternal Data Has patient been taught Hand Expression?: Yes Does the patient have breastfeeding experience prior to this delivery?: No  Feeding Mother's Current Feeding Choice: Breast Milk  LATCH Score Latch: Too sleepy or reluctant, no latch achieved, no sucking elicited.  Audible Swallowing: A few with stimulation (when appropriately latched)  Type of Nipple: Flat (manipulative tissue)  Comfort (Breast/Nipple): Soft / non-tender  Hold (Positioning): Full assist, staff holds infant at breast  LATCH Score: 5   Lactation Tools Discussed/Used    Interventions Interventions: Breast feeding basics reviewed;Assisted with latch;Hand express;Support pillows;Education  Discharge    Consult Status Consult Status: Follow-up Date: 04/07/21 Follow-up type: In-patient    Danford Bad 04/07/2021, 10:22 AM

## 2021-04-07 NOTE — Discharge Summary (Addendum)
Postpartum Discharge Summary  Updated: 04/08/2021      Patient Name: Candice Villarreal DOB: [birth year] MRN: [id number]  Date of admission: 04/07/2021 Delivery date:04/07/2021  Delivering provider: Imagene Riches  Date of discharge: 04/09/2021  Admitting diagnosis: Uterine contractions [O47.9] Intrauterine pregnancy: [redacted]w[redacted]d    Secondary diagnosis:  Active Problems:   Uterine contractions   Normal labor and delivery   Postpartum care following vaginal delivery  Additional problems: none    Discharge diagnosis: Term Pregnancy Delivered                                              Post partum procedures: none Augmentation: N/A Complications: None  Hospital course: Onset of Labor With Vaginal Delivery      20y.o. yo G1P0 at 364w6das admitted in Active Labor on 04/07/2021. Patient had an uncomplicated labor course as follows:  Membrane Rupture Time/Date: 2:50 AM ,04/07/2021   Delivery Method:Vaginal, Spontaneous  Episiotomy: None  Lacerations:  None  Patient had an uncomplicated postpartum course.  She is ambulating, tolerating a regular diet, passing flatus, and urinating well. Patient is discharged home in stable condition on 04/08/21.  Newborn Data: Birth date:04/07/2021  Birth time:6:03 AM  Gender:Female  Living status:Living  Apgars:3 ,9  Weight:3040 g   Magnesium Sulfate received: No BMZ received: No Rhophylac:N/A MMR:No T-DaP:Given prenatally Flu: No Transfusion:No  Physical exam  Vitals:   04/07/21 1533 04/07/21 2013 04/07/21 2321 04/08/21 0743  BP: 120/75 119/85 122/75 119/74  Pulse: 64 66 64 60  Resp: _0 Temp: 98.4 F (36.9 C) 98.1 F (36.7 C) 98.2 F (36.8 C) 98 F (36.7 C)  TempSrc: Oral Oral Oral Oral  SpO2:  99% 99% 100%  Weight:      Height:       General: alert, cooperative, and no distress Lochia: appropriate Uterine Fundus: firm Incision: N/A DVT Evaluation: No evidence of DVT seen on physical exam. Negative Homan's  sign. Labs: Lab Results  Component Value Date   WBC 13.5 (H) 04/08/2021   HGB 9.1 (L) 04/08/2021   HCT 27.8 (L) 04/08/2021   MCV 82.5 04/08/2021   PLT 234 04/08/2021   CMP Latest Ref Rng & Units 02/18/2020  Glucose 65 - 99 mg/dL 89  BUN 6 - 20 mg/dL 11  Creatinine 0.57 - 1.00 mg/dL 0.70  Sodium 134 - 144 mmol/L 138  Potassium 3.5 - 5.2 mmol/L 4.9  Chloride 96 - 106 mmol/L 100  CO2 20 - 29 mmol/L 23  Calcium 8.7 - 10.2 mg/dL 9.9  Total Protein 6.5 - 8.1 g/dL -  Total Bilirubin 0.3 - 1.2 mg/dL -  Alkaline Phos 38 - 126 U/L -  AST 15 - 41 U/L -  ALT 0 - 44 U/L -   Edinburgh Score: Edinburgh Postnatal Depression Scale Screening Tool 04/07/2021  I have been able to laugh and see the funny side of things. (No Data)      After visit meds:  Allergies as of 04/08/2021   No Known Allergies      Medication List     TAKE these medications    aspirin EC 81 MG tablet Take 1 tablet (81 mg total) by mouth daily. Swallow whole.   ibuprofen 600 MG tablet Commonly known as: ADVIL Take 1 tablet (600 mg total) by mouth every 6 (six)  hours.   multivitamin-prenatal 27-0.8 MG Tabs tablet Take 1 tablet by mouth daily at 12 noon.         Discharge home in stable condition Infant Feeding: Breast Infant Disposition:home with mother Discharge instruction: per After Visit Summary and Postpartum booklet. Activity: Advance as tolerated. Pelvic rest for 6 weeks.  Diet: routine diet Anticipated Birth Control: POPs Postpartum Appointment:6 weeks Additional Postpartum F/U: Postpartum Depression checkup Future Appointments: Future Appointments  Date Time Provider [place]  05/26/2021  1:00 PM [facility] ECHO 4 [id number]  05/26/2021  2:00 PM Eileen Stanford, PA-C [facility]   Follow up Visit:  Follow-up Information     Imagene Riches, CNM. Schedule an appointment as soon as possible for a visit in 6 week(s).   Specialties: Obstetrics,  Gynecology Contact information: [address] [place] [address] [phone]                   04/08/2021 Imagene Riches, CNM  Note: the patient was discharged on 04/09/2021. This note originally stated that the patient was discharged on 04/08/2021.  This note was update to reflect the correct discharge date.  Prentice Docker, MD, [facility], [facility] 04/12/2021 1:55 PM

## 2021-04-07 NOTE — TOC Initial Note (Addendum)
Transition of Care [facility] - Initial/Assessment Note    Patient Details  Name: Candice Villarreal MRN: [id number] Date of Birth: 2001-04-28  [facility]) CM/SW Contact:    Eaton Cellar, RN Phone Number: 04/07/2021, 4:33 PM  Clinical Narrative:                 LVMM on mobile [phone] requesting callback to discuss any needs or concerns for infant.        Patient Goals and CMS Choice        Expected Discharge Plan and Services                                                Prior Living Arrangements/Services                       Activities of Daily Living Home Assistive Devices/Equipment: None ADL Screening (condition at time of admission) Patient's cognitive ability adequate to safely complete daily activities?: No Is the patient deaf or have difficulty hearing?: No Does the patient have difficulty seeing, even when wearing glasses/contacts?: No Does the patient have difficulty concentrating, remembering, or making decisions?: No Patient able to express need for assistance with ADLs?: No Does the patient have difficulty dressing or bathing?: No Independently performs ADLs?: Yes (appropriate for developmental age) Does the patient have difficulty walking or climbing stairs?: No Weakness of Legs: None Weakness of Arms/Hands: None  Permission Sought/Granted                  Emotional Assessment              Admission diagnosis:  Uterine contractions [O47.9] Patient Active Problem List   Diagnosis Date Noted   Uterine contractions 04/07/2021   Normal labor and delivery    Postpartum care following vaginal delivery    Supervision of high risk pregnancy, antepartum 08/25/2020   TIA (transient ischemic attack)    PFO (patent foramen ovale) 10/20/2019   Numbness and tingling of right side of face 10/20/2019   Tobacco use disorder 10/19/2019   PCP:  [facility] [facility] [employer] Pharmacy:   [facility] [address] [place], [place] - [address] [place] [address] Phone: [phone] Fax: [phone]     Social Determinants of Health (SDOH) Interventions    Readmission Risk Interventions No flowsheet data found.

## 2021-04-08 LAB — CBC
HCT: 27.8 % — ABNORMAL LOW (ref 36.0–46.0)
Hemoglobin: 9.1 g/dL — ABNORMAL LOW (ref 12.0–15.0)
MCH: 27 pg (ref 26.0–34.0)
MCHC: 32.7 g/dL (ref 30.0–36.0)
MCV: 82.5 fL (ref 80.0–100.0)
Platelets: 234 10*3/uL (ref 150–400)
RBC: 3.37 MIL/uL — ABNORMAL LOW (ref 3.87–5.11)
RDW: 13.3 % (ref 11.5–15.5)
WBC: 13.5 10*3/uL — ABNORMAL HIGH (ref 4.0–10.5)
nRBC: 0 % (ref 0.0–0.2)

## 2021-04-08 MED ORDER — IBUPROFEN 600 MG PO TABS: 600.0000 mg | ORAL_TABLET | Freq: Four times a day (QID) | ORAL | 0 refills | Status: DC

## 2021-04-08 NOTE — TOC Initial Note (Signed)
Transition of Care [facility]) - Initial/Assessment Note    Patient Details  Name: Candice Villarreal MRN: [id number] Date of Birth: 2000-11-27  [facility]) CM/SW Contact:    Vaughn Cellar, RN Phone Number: 04/08/2021, 2:02 PM  Clinical Narrative:                 Spoke with patient and best friend, support system at bedside. MOB was nursing infant with her friends assistance. Both were very involved and appropriate smiling and bonding with infant. MOB reports she is feeling good and very happy. Discussed PPD signs and symptoms and how to seek help if needed. Support system was involved and asking questions on ways to help MOB with recovery. No other children in the home. Reviewed proper hand hygiene, avoiding sick contacts and not allowing exposure to second hand smoke. Both verbalized understanding. FOB is involved in care of infant and lives in the home however parents are no longer in relationship. MOB reports she has not selected a pediatrician at this time but is reviewing the list provided by nursing. MOB reports she has all needed equipment and is planning to get enrolled with [facility] services. Reviewed process for enrolling. No needs or concerns from TOC.         Patient Goals and CMS Choice        Expected Discharge Plan and Services           Expected Discharge Date: 04/08/21                                    Prior Living Arrangements/Services                       Activities of Daily Living Home Assistive Devices/Equipment: None ADL Screening (condition at time of admission) Patient's cognitive ability adequate to safely complete daily activities?: No Is the patient deaf or have difficulty hearing?: No Does the patient have difficulty seeing, even when wearing glasses/contacts?: No Does the patient have difficulty concentrating, remembering, or making decisions?: No Patient able to express need for assistance with ADLs?: No Does the patient have  difficulty dressing or bathing?: No Independently performs ADLs?: Yes (appropriate for developmental age) Does the patient have difficulty walking or climbing stairs?: No Weakness of Legs: None Weakness of Arms/Hands: None  Permission Sought/Granted                  Emotional Assessment              Admission diagnosis:  Uterine contractions [O47.9] Patient Active Problem List   Diagnosis Date Noted   Uterine contractions 04/07/2021   Normal labor and delivery    Postpartum care following vaginal delivery    Supervision of high risk pregnancy, antepartum 08/25/2020   TIA (transient ischemic attack)    PFO (patent foramen ovale) 10/20/2019   Numbness and tingling of right side of face 10/20/2019   Tobacco use disorder 10/19/2019   PCP:  [employer] Pharmacy:   [facility] [address] [place], [place] - [address] [place] [address] Phone: [phone] Fax: [phone]     Social Determinants of Health (SDOH) Interventions    Readmission Risk Interventions No flowsheet data found.

## 2021-04-08 NOTE — Anesthesia Postprocedure Evaluation (Signed)
Anesthesia Post Note  Patient: Candice Villarreal  Procedure(s) Performed: AN AD HOC LABOR EPIDURAL  Patient location during evaluation: Mother Baby Anesthesia Type: Epidural Level of consciousness: awake and alert Pain management: pain level controlled Vital Signs Assessment: post-procedure vital signs reviewed and stable Respiratory status: spontaneous breathing, nonlabored ventilation and respiratory function stable Cardiovascular status: stable Postop Assessment: no headache, no backache and epidural receding Anesthetic complications: no   No notable events documented.   Last Vitals:  Vitals:   04/07/21 2321 04/08/21 0743  BP: 122/75 119/74  Pulse: 64 60  Resp: 20 18  Temp: 36.8 C 36.7 C  SpO2: 99% 100%    Last Pain:  Vitals:   04/08/21 0743  TempSrc: Oral  PainSc:                  [name]

## 2021-04-08 NOTE — Lactation Note (Addendum)
This note was copied from a baby's chart. Lactation Consultation Note  Patient Name: Boy Lunell Robart [id number] Date: 04/08/2021 Reason for consult: Follow-up assessment;Primapara;Term Age:21 hours  Maternal Data Has patient been taught Hand Expression?: Yes Does the patient have breastfeeding experience prior to this delivery?: No  Feeding Mother's Current Feeding Choice: Breast Milk Mom had been using a nipple shield to latch baby, today has been able to latch without shield, baby latching easily, in side lying position on both breasts, lots of audible swallows present LATCH Score Latch: Grasps breast easily, tongue down, lips flanged, rhythmical sucking.  Audible Swallowing: Spontaneous and intermittent  Type of Nipple: Everted at rest and after stimulation  Comfort (Breast/Nipple): Soft / non-tender  Hold (Positioning): Assistance needed to correctly position infant at breast and maintain latch.  LATCH Score: 9   Lactation Tools Discussed/Used  LC name and no written on white board   Interventions Interventions: Breast feeding basics reviewed;Assisted with latch;Skin to skin;Hand express;Breast massage;Adjust position;Education  Discharge Pump: Personal WIC Program: No (plans on signing up)  Consult Status Consult Status: PRN Date: 04/08/21 Follow-up type: In-patient    Dyann Kief 04/08/2021, 3:26 PM

## 2021-04-09 MED ORDER — NORETHINDRONE 0.35 MG PO TABS: 1.0000 | ORAL_TABLET | Freq: Every day | ORAL | 11 refills | Status: DC

## 2021-04-09 NOTE — Progress Notes (Deleted)
Subjective:  Doing well.  Ambulating, voiding.  Has only taken tylenol for pain  Objective:  Vital signs in last 24 hours: Temp:  [98.2 F (36.8 C)-98.6 F (37 C)] 98.2 F (36.8 C) (08/13 0822) Pulse Rate:  [59-68] 59 (08/13 0822) Resp:  [18-20] 18 (08/13 0822) BP: (119-133)/(63-81) 119/63 (08/13 0822) SpO2:  [100 %] 100 % (08/13 0822)    Intake/Output      08/12 0701 08/13 0700 08/13 0701 08/14 0700   Urine (mL/kg/hr)     Blood     Total Output     Net            General: NAD Pulmonary: no increased work of breathing Abdomen: non-distended, non-tender, fundus firm at level of umbilicus Incision: D/C/I Extremities: no edema, no erythema, no tenderness  Results for orders placed or performed during the hospital encounter of 04/07/21 (from the past 72 hour(s))  CBC     Status: Abnormal   Collection Time: 04/07/21  3:17 AM  Result Value Ref Range   WBC 15.2 (H) 4.0 - 10.5 K/uL   RBC 3.89 3.87 - 5.11 MIL/uL   Hemoglobin 10.7 (L) 12.0 - 15.0 g/dL   HCT 32.1 (L) 36.0 - 46.0 %   MCV 82.5 80.0 - 100.0 fL   MCH 27.5 26.0 - 34.0 pg   MCHC 33.3 30.0 - 36.0 g/dL   RDW 13.1 11.5 - 15.5 %   Platelets 324 150 - 400 K/uL   nRBC 0.0 0.0 - 0.2 %    Comment: Performed at [facility], [address] [place], [place] [address]  Resp Panel by RT-PCR (Flu A&B, Covid) Nasopharyngeal Swab     Status: None   Collection Time: 04/07/21  3:25 AM   Specimen: Nasopharyngeal Swab; Nasopharyngeal(NP) swabs in vial transport medium  Result Value Ref Range   SARS Coronavirus 2 by RT PCR NEGATIVE NEGATIVE    Comment: (NOTE) SARS-CoV-2 target nucleic acids are NOT DETECTED.  The SARS-CoV-2 RNA is generally detectable in upper respiratory specimens during the acute phase of infection. The lowest concentration of SARS-CoV-2 viral copies this assay can detect is 138 copies/mL. A negative result does not preclude SARS-Cov-2 infection and should not be used as the sole basis for  treatment or other patient management decisions. A negative result may occur with  improper specimen collection/handling, submission of specimen other than nasopharyngeal swab, presence of viral mutation(s) within the areas targeted by this assay, and inadequate number of viral copies(<138 copies/mL). A negative result must be combined with clinical observations, patient history, and epidemiological information. The expected result is Negative.  Fact Sheet for Patients:  EntrepreneurPulse.com.au  Fact Sheet for Healthcare Providers:  IncredibleEmployment.be  This test is no t yet approved or cleared by the [place] FDA and  has been authorized for detection and/or diagnosis of SARS-CoV-2 by FDA under an Emergency Use Authorization (EUA). This EUA will remain  in effect (meaning this test can be used) for the duration of the COVID-19 declaration under Section 564(b)(1) of the Act, 21 U.S.C.section 360bbb-3(b)(1), unless the authorization is terminated  or revoked sooner.       Influenza A by PCR NEGATIVE NEGATIVE   Influenza B by PCR NEGATIVE NEGATIVE    Comment: (NOTE) The Xpert Xpress SARS-CoV-2/FLU/RSV plus assay is intended as an aid in the diagnosis of influenza from Nasopharyngeal swab specimens and should not be used as a sole basis for treatment. Nasal washings and aspirates are unacceptable for Xpert  Xpress SARS-CoV-2/FLU/RSV testing.  Fact Sheet for Patients: EntrepreneurPulse.com.au  Fact Sheet for Healthcare Providers: IncredibleEmployment.be  This test is not yet approved or cleared by the [place] FDA and has been authorized for detection and/or diagnosis of SARS-CoV-2 by FDA under an Emergency Use Authorization (EUA). This EUA will remain in effect (meaning this test can be used) for the duration of the COVID-19 declaration under Section 564(b)(1) of the Act, 21 U.S.C. section  360bbb-3(b)(1), unless the authorization is terminated or revoked.  Performed at [facility], [address] [place], [place] [address]   Type and screen [place]     Status: None   Collection Time: 04/07/21  3:35 AM  Result Value Ref Range   ABO/RH(D) O POS    Antibody Screen NEG    Sample Expiration      04/10/2021,2359 Performed at [facility], [address] [place], [place] [address]   RPR     Status: None   Collection Time: 04/07/21  3:35 AM  Result Value Ref Range   RPR Ser Ql NON REACTIVE NON REACTIVE    Comment: Performed at [facility], [place] [address] [place], [place] [address]  ABO/Rh     Status: None   Collection Time: 04/07/21  7:35 AM  Result Value Ref Range   ABO/RH(D)      O POS Performed at [facility], [address] [place], [place] [address]   CBC     Status: Abnormal   Collection Time: 04/08/21  4:37 AM  Result Value Ref Range   WBC 13.5 (H) 4.0 - 10.5 K/uL   RBC 3.37 (L) 3.87 - 5.11 MIL/uL   Hemoglobin 9.1 (L) 12.0 - 15.0 g/dL   HCT 27.8 (L) 36.0 - 46.0 %   MCV 82.5 80.0 - 100.0 fL   MCH 27.0 26.0 - 34.0 pg   MCHC 32.7 30.0 - 36.0 g/dL   RDW 13.3 11.5 - 15.5 %   Platelets 234 150 - 400 K/uL   nRBC 0.0 0.0 - 0.2 %    Comment: Performed at [facility], [address] [place], [place] [address]    Immunization History  Administered Date(s) Administered   Tdap 01/31/2021    Assessment:   20 y.o. G1P1001 postoperativeday # 1 LTCS fetal intolerance   Plan:  ) Acute blood loss anemia - hemodynamically stable and asymptomatic - po ferrous sulfate  2) Blood Type --/--/O POS Performed at [facility], [address] [place], [place] [address]  [phone]) / Charlynn Grimes 1.76 (12/16 1125) / Varicella Immune - MMR postpartum  3) TDAP status up to date  4) Feeding plan breast  5)  Education given regarding options for contraception, as well as compatibility  with breast feeding if applicable.  Patient plans on oral progesterone-only contraceptive for contraception.  6) Disposition  Malachy Mood, MD, [place], [facility] 04/09/2021, 10:41 AM

## 2021-04-09 NOTE — Progress Notes (Signed)
Pt discharged with infant.  Discharge instructions, prescriptions and follow up appointment given to and reviewed with pt. Pt verbalized understanding. Escorted out by auxillary. 

## 2021-04-11 ENCOUNTER — Encounter: Payer: Medicaid Other | Admitting: Obstetrics & Gynecology

## 2021-05-26 ENCOUNTER — Encounter: Payer: Self-pay | Admitting: Physician Assistant

## 2021-05-26 ENCOUNTER — Other Ambulatory Visit (HOSPITAL_COMMUNITY): Payer: Medicaid Other

## 2021-05-26 ENCOUNTER — Other Ambulatory Visit: Payer: Self-pay

## 2021-05-26 ENCOUNTER — Ambulatory Visit (INDEPENDENT_AMBULATORY_CARE_PROVIDER_SITE_OTHER): Payer: Medicaid Other | Admitting: Physician Assistant

## 2021-05-26 ENCOUNTER — Ambulatory Visit (HOSPITAL_COMMUNITY): Payer: Medicaid Other | Attending: Cardiology

## 2021-05-26 VITALS — BP 100/60 | HR 64 | Ht 60.0 in | Wt 111.2 lb

## 2021-05-26 DIAGNOSIS — Z8774 Personal history of (corrected) congenital malformations of heart and circulatory system: Secondary | ICD-10-CM | POA: Diagnosis present

## 2021-05-26 DIAGNOSIS — Q2112 Patent foramen ovale: Secondary | ICD-10-CM

## 2021-05-26 DIAGNOSIS — Q211 Atrial septal defect: Secondary | ICD-10-CM | POA: Diagnosis not present

## 2021-05-26 LAB — ECHOCARDIOGRAM LIMITED BUBBLE STUDY
Area-P 1/2: 3.68 cm2
S' Lateral: 3.1 cm

## 2021-05-26 NOTE — Progress Notes (Signed)
[facility]   MULTIDISCIPLINARY HEART VALVE CLINIC                                     Cardiology Office Note:    Date:  05/26/2021   ID:  Candice Villarreal, DOB 07-Dec-2000, MRN [id number]  PCP:  [facility]  [facility] Cardiologist:  [name], MD /Dr. Excell Seltzer, MD (PFO closure)  Referring MD: [facility]*   Chief Complaint  Patient presents with   Follow-up    1 yr f/u PFO    History of Present Illness:    Candice Villarreal is a 20 y.o. female with a hx of migraine headaches, cryptogenic stroke and PFO who underwent PFO closure 02/25/2020.   She initially presented to [facility] 09/2019 with symptoms of expressive aphasia, facial numbness, and right arm numbness. She was treated with TPA with symptoms resolution. Brain MRI was negative. Echo bubble study was positive for right to left shunting. Otherwise there was no cardiac abnormality identified on echocardiogram. TEE demonstrated normal LVE EF with changes suggestive of a PFO.   She was referred to Dr. Excell Seltzer for consideration of PFO closure. She was initially planned for  on 01/21/20 however, there was question of a possible pregnancy due to a late menstrual cycle and daily nausea and plans were put on hold. She was confirmed to not be pregnant and later underwent successful transcatheter PFO closure using an 18 mm Amplatzer PFO occluder on 02/25/20. Post procedure limited echo was normal with no residual shunting. She was discharged on ASA and Plavix.   She was then seen in follow up 04/12/2020 and was doing well with no symptoms. She was instructed to discontinue Plavix on 05/27/2020. SBE prophylaxis for 6 months was discussed. Plan was for one year follow up with echocardiogram/bubble study.   She presents today with her friend and 61 month old baby boy. She has been doing well with no new neurological symptoms, no SOB, chest pain, palpitations, or pre-syncope. She stopped Plavix as directed  and has continued on ASA with no problems. She had follow echocardiogram with bubble today with preliminary review with no evidence of shunting.   Past Medical History:  Diagnosis Date   Acute appendicitis 01/28/2019   Expressive aphasia 10/19/2019   Numbness and tingling of right face 10/19/2019   Numbness and tingling of right side of face 10/20/2019   PFO (patent foramen ovale) 10/20/2019   Right arm weakness 10/19/2019   TIA (transient ischemic attack)    Tobacco use disorder 10/19/2019    Past Surgical History:  Procedure Laterality Date   APPENDECTOMY     LAPAROSCOPIC APPENDECTOMY N/A 01/28/2019   Procedure: APPENDECTOMY LAPAROSCOPIC;  Surgeon: Ancil Linsey, MD;  Location: [facility] ORS;  Service: General;  Laterality: N/A;   PATENT FORAMEN OVALE(PFO) CLOSURE N/A 02/25/2020   Procedure: PATENT FORAMEN OVALE (PFO) CLOSURE;  Surgeon: Tonny Bollman, MD;  Location: [facility] INVASIVE CV LAB;  Service: Cardiovascular;  Laterality: N/A;   TEE WITHOUT CARDIOVERSION N/A 10/21/2019   Procedure: TRANSESOPHAGEAL ECHOCARDIOGRAM (TEE);  Surgeon: [name], MD;  Location: [facility] ORS;  Service: Cardiovascular;  Laterality: N/A;    Current Medications: Current Meds  Medication Sig   aspirin EC 81 MG tablet Take 1 tablet (81 mg total) by mouth daily. Swallow whole.   ibuprofen (ADVIL) 600 MG tablet Take 1 tablet (600 mg total) by mouth every 6 (  six) hours.   norethindrone (MICRONOR) 0.35 MG tablet Take 1 tablet (0.35 mg total) by mouth daily.   Prenatal Vit-Fe Fumarate-FA (MULTIVITAMIN-PRENATAL) 27-0.8 MG TABS tablet Take 1 tablet by mouth daily at 12 noon.     Allergies:   Patient has no known allergies.   Social History   Socioeconomic History   Marital status: Significant Other    Spouse name: Eliberto Ivory   Number of children: Not on file   Years of education: Not on file   Highest education level: Not on file  Occupational History   Not on file  Tobacco Use   Smoking status: Former    Types:  Cigarettes    Quit date: 11/24/2019    Years since quitting: 1.5   Smokeless tobacco: Never  Vaping Use   Vaping Use: Never used  Substance and Sexual Activity   Alcohol use: Not Currently   Drug use: Not Currently    Types: Marijuana   Sexual activity: Yes    Birth control/protection: Pill  Other Topics Concern   Not on file  Social History Narrative   Not on file   Social Determinants of Health   Financial Resource Strain: Not on file  Food Insecurity: Not on file  Transportation Needs: Not on file  Physical Activity: Not on file  Stress: Not on file  Social Connections: Not on file     Family History The patient's family history includes Breast cancer (age of onset: 101) in her maternal grandmother; Cancer in her maternal grandfather and maternal grandmother; Colon cancer (age of onset: 88) in her maternal grandfather; Hyperlipidemia in her mother; Hypertension in her mother.  ROS:   Please see the history of present illness.    All other systems reviewed and are negative.  EKGs/Labs/Other Studies Reviewed:    The following studies were reviewed today:  Echo 10-20-2019: IMPRESSIONS    1. Left ventricular ejection fraction, by estimation, is 55 to 60%. The  left ventricle has normal function. The left ventricle has no regional  wall motion abnormalities. Left ventricular diastolic parameters were  normal.   2. Right ventricular systolic function is normal. The right ventricular  size is normal. Tricuspid regurgitation signal is inadequate for assessing  PA pressure.   3. The mitral valve is normal in structure and function. No evidence of  mitral valve regurgitation. No evidence of mitral stenosis.   4. The aortic valve is normal in structure and function. Aortic valve  regurgitation is not visualized. No aortic stenosis is present.   5. The inferior vena cava is normal in size with greater than 50%  respiratory variability, suggesting right atrial pressure of 3  mmHg.   6. Agitated saline contrast bubble study was positive with shunting  observed within 3-6 cardiac cycles suggestive of interatrial shunt.   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    Transesophageal echo: FINDINGS   Left Ventricle: Left ventricular ejection fraction, by estimation, is 60  to 65%. The left ventricle has normal function. The left ventricle has no  regional wall motion abnormalities. The left ventricular internal cavity  size was normal in size. There is   no left ventricular hypertrophy.   Right Ventricle: The right ventricular size is normal. Right vetricular  wall thickness was not assessed. Right ventricular systolic function is  normal.   Left Atrium: Left atrial size was not well visualized. No left atrial/left  atrial appendage thrombus was  detected.   Right Atrium: Right atrial size was not well visualized.   Pericardium: There is no evidence of pericardial effusion.   Mitral Valve: The mitral valve is normal in structure and function. No  evidence of mitral valve regurgitation.   Tricuspid Valve: The tricuspid valve is normal in structure. Tricuspid  valve regurgitation is trivial.   Aortic Valve: The aortic valve is tricuspid. Aortic valve regurgitation is  not visualized.   Pulmonic Valve: The pulmonic valve was grossly normal. Pulmonic valve  regurgitation is not visualized.   Aorta: The aortic root and ascending aorta are structurally normal, with  no evidence of dilitation.   IAS/Shunts: There is redundancy of the interatrial septum. No atrial level  shunt detected by color flow Doppler. Agitated saline contrast was given  intravenously to evaluate for intracardiac shunting. Bubble study was  negative, though Duplex images  suggest possible PFO.    Transcranial Doppler study 01/14/2020: Summary:     A vascular  evaluation was performed. The right middle cerebral artery was  studied. An IV was inserted into the patient's left AC. Verbal informed  consent was obtained.     10 - 30 HITS were noted. Degree 2 PFO.   POSITIVE TCD Bubble study indicative of medium size right to left shunt    __________________   02/25/20 PATENT FORAMEN OVALE (PFO) CLOSURE  Conclusion   Successful transcatheter PFO closure using an 18 mm Amplatzer PFO occluder   Recommend: Post-procedure limited echo prior to DC ASA/clopidogrel x 3 months without interruption SBE prophylaxis x 6 months per protocol Same day DC per protocol   __________________   Echo limited 02/25/20 IMPRESSIONS   1. Left ventricular ejection fraction, by estimation, is 60 to 65%. The  left ventricle has normal function. Left ventricular diastolic parameters  were normal.   2. 18 mm Amplatzer PFO occluder in place. No residual shunting noted by  color flow Doppler.   3. The mitral valve is normal in structure. No evidence of mitral valve  regurgitation. No evidence of mitral stenosis.   4. The aortic valve is tricuspid. Aortic valve regurgitation is not  visualized. No aortic stenosis is present.   5. There is normal pulmonary artery systolic pressure.   _______________________   Echo with bubble 9/39/22 IMPRESSIONS  1. S/p patent foramen ovale closure, 18 mm Amplatzer - negative bubble study.  2. Left ventricular ejection fraction, by estimation, is 60 to 65%. The left ventricle has normal function. The left ventricle has no regional wall motion abnormalities.  3. Right ventricular systolic function is normal. The right ventricular size is normal. There is normal pulmonary artery systolic pressure.  4. The mitral valve is normal in structure. No evidence of mitral valve regurgitation. No evidence of mitral stenosis.  5. The aortic valve is normal in structure. Aortic valve regurgitation is not visualized. No aortic stenosis is  present.  6. The inferior vena cava is normal in size with greater than 50% respiratory variability, suggesting right atrial pressure of 3 mmHg.  7. Agitated saline contrast bubble study was negative, with no evidence of any interatrial shunt.   EKG:  EKG is ordered today.  The ekg ordered today demonstrates NSR with HR 64bpm, no new changes   Recent Labs: 04/08/2021: Hemoglobin 9.1; Platelets 234  Recent Lipid Panel    Component Value Date/Time   CHOL 140 10/20/2019 0429   TRIG 64 10/20/2019 0429   HDL 41 10/20/2019 0429   CHOLHDL 3.4 10/20/2019 0429  VLDL 13 10/20/2019 0429   LDLCALC 86 10/20/2019 0429   Physical Exam:    VS:  BP 100/60   Pulse 64   Ht 5' (1.524 m)   Wt 111 lb 3.2 oz (50.4 kg)   SpO2 99%   Breastfeeding No   BMI 21.72 kg/m     Wt Readings from Last 3 Encounters:  05/26/21 111 lb 3.2 oz (50.4 kg)  04/07/21 135 lb (61.2 kg)  04/04/21 133 lb (60.3 kg)    General: Well developed, well nourished, NAD Neck: Negative for carotid bruits. No JVD Lungs:Clear to ausculation bilaterally. Breathing is unlabored. Cardiovascular: RRR with S1 S2. No murmurs Extremities: No edema. Radial pulses 2+ bilaterally Neuro: Alert and oriented. No focal deficits. No facial asymmetry. MAE spontaneously. Psych: Responds to questions appropriately with normal affect.    ASSESSMENT/PLAN:    1. S/p PFO closure: Pt in now s/p 1 yr post PFO closure. She has been doing very well. She stopped her Plavix appropriately 04/2020 and has since been taking ASA which will be lifelong. Echo today showed normal EF and device positioning and negative bubble study. Continue aspirin lifelong. No longer needs SBE prophylaxis as she is out past 6 months from closure.    Medication Adjustments/Labs and Tests Ordered: Current medicines are reviewed at length with the patient today.  Concerns regarding medicines are outlined above.  Orders Placed This Encounter  Procedures   EKG 12-Lead   No  orders of the defined types were placed in this encounter.   Patient Instructions  Medication Instructions:  No changes  *If you need a refill on your cardiac medications before your next appointment, please call your pharmacy*   Lab Work: None  If you have labs (blood work) drawn today and your tests are completely normal, you will receive your results only by: MyChart Message (if you have MyChart) OR A paper copy in the mail If you have any lab test that is abnormal or we need to change your treatment, we will call you to review the results.  Follow up as needed.      Raliegh Ip NP 05/26/2021 3:56 PM    [facility]   Note written with Georgie Chard NP. I have seen and examined the patient and agree with the assessment and plan as outlined.    Cline Crock PA-C  [facility]

## 2021-05-26 NOTE — Patient Instructions (Signed)
Medication Instructions:  No changes  *If you need a refill on your cardiac medications before your next appointment, please call your pharmacy*   Lab Work: None  If you have labs (blood work) drawn today and your tests are completely normal, you will receive your results only by: MyChart Message (if you have MyChart) OR A paper copy in the mail If you have any lab test that is abnormal or we need to change your treatment, we will call you to review the results.  Follow up as needed.

## 2022-09-19 ENCOUNTER — Ambulatory Visit (INDEPENDENT_AMBULATORY_CARE_PROVIDER_SITE_OTHER): Payer: Medicaid Other | Admitting: Licensed Practical Nurse

## 2022-09-19 ENCOUNTER — Other Ambulatory Visit (HOSPITAL_COMMUNITY)
Admission: RE | Admit: 2022-09-19 | Discharge: 2022-09-19 | Disposition: A | Discharge status: Home / Self Care | Payer: Medicaid Other | Source: Ambulatory Visit | Attending: Licensed Practical Nurse | Admitting: Licensed Practical Nurse

## 2022-09-19 VITALS — BP 110/62 | HR 73 | Wt 104.8 lb

## 2022-09-19 DIAGNOSIS — Z3202 Encounter for pregnancy test, result negative: Secondary | ICD-10-CM

## 2022-09-19 DIAGNOSIS — N912 Amenorrhea, unspecified: Secondary | ICD-10-CM

## 2022-09-19 DIAGNOSIS — Z3042 Encounter for surveillance of injectable contraceptive: Secondary | ICD-10-CM

## 2022-09-19 DIAGNOSIS — Z113 Encounter for screening for infections with a predominantly sexual mode of transmission: Secondary | ICD-10-CM | POA: Insufficient documentation

## 2022-09-19 LAB — POCT URINE PREGNANCY: Preg Test, Ur: NEGATIVE

## 2022-09-19 MED ORDER — MEDROXYPROGESTERONE ACETATE 150 MG/ML IM SUSP
150.0000 mg | Freq: Once | INTRAMUSCULAR | Status: AC
  Administered 2022-09-19: 150 mg via INTRAMUSCULAR

## 2022-09-19 NOTE — Progress Notes (Signed)
SUBJECTIVE: Here for contraceptive management Candice Villarreal had a SVB in 03/2021, she did not make it to her PP visit d/t family stressors. She is now in a new relationship, she would like contraception.  She does vape, has a hx of Migraines, she had  a TIA d/t a PFO-she since has had surgery to repair the PFO.  Since the birth of her child, her  cycles are every 23-30 days, last 5 days, are heavy-she changes a super tampon every 4-5 hours, has flooded her tampon and pad/clothing. She does experience cramping.   She desire screening for STI, has a new partner Has never had a pap   OBJECTIVE: BP 110/62   Pulse 73   Wt 104 lb 12.8 oz (47.5 kg)   BMI 20.47 kg/m  Gen: NAD PE not performed   ASSESSMENT: Depo injection Screening for STI   PLAN: Reviewed contraception options-progesterone only methods. Pt desires Depo, risks.benefits side effects reviewed    Depo given  Will schedule annual exam with next Depo injection  Roberto Scales, [facility]  09/19/22  3:32 PM

## 2022-09-20 LAB — URINE CYTOLOGY ANCILLARY ONLY
Chlamydia: NEGATIVE
Comment: NEGATIVE
Comment: NEGATIVE
Comment: NORMAL
Neisseria Gonorrhea: NEGATIVE
Trichomonas: NEGATIVE

## 2022-09-20 LAB — HEP, RPR, HIV PANEL
HIV Screen 4th Generation wRfx: NONREACTIVE
Hepatitis B Surface Ag: NEGATIVE
RPR Ser Ql: NONREACTIVE

## 2022-12-12 ENCOUNTER — Ambulatory Visit (INDEPENDENT_AMBULATORY_CARE_PROVIDER_SITE_OTHER): Payer: Medicaid Other

## 2022-12-12 VITALS — BP 110/67 | HR 80 | Ht 60.0 in | Wt 104.4 lb

## 2022-12-12 DIAGNOSIS — Z3042 Encounter for surveillance of injectable contraceptive: Secondary | ICD-10-CM

## 2022-12-12 MED ORDER — MEDROXYPROGESTERONE ACETATE 150 MG/ML IM SUSP
150.0000 mg | Freq: Once | INTRAMUSCULAR | Status: AC
  Administered 2022-12-12: 150 mg via INTRAMUSCULAR

## 2022-12-12 NOTE — Patient Instructions (Signed)

## 2022-12-12 NOTE — Progress Notes (Signed)
    NURSE VISIT NOTE  Subjective:    Patient ID: Candice Villarreal, female    DOB: [birth year], 22 y.o.   MRN: [id number]  HPI  Patient is a 22 y.o. G72P1001 female who presents for depo provera injection.   Objective:    BP 110/67   Pulse 80   Wt 104 lb 6.4 oz (47.4 kg)   BMI 20.39 kg/m   Last Annual: 04/07/21. Last pap: None. Last Depo-Provera: 09/20/2022. Side Effects if any: none. Serum HCG indicated? No . Depo-Provera 150 mg IM given by: Rocco Serene, LPN. Site: Left Deltoid  Lab Review    Assessment:   1. Encounter for surveillance of injectable contraceptive      Plan:   Next appointment due between 02/27/23 and 03/13/23 for Annual and Depo.    Rocco Serene, LPN

## 2023-01-03 ENCOUNTER — Ambulatory Visit (INDEPENDENT_AMBULATORY_CARE_PROVIDER_SITE_OTHER): Payer: No Typology Code available for payment source | Admitting: Licensed Practical Nurse

## 2023-01-03 ENCOUNTER — Other Ambulatory Visit (HOSPITAL_COMMUNITY)
Admission: RE | Admit: 2023-01-03 | Discharge: 2023-01-03 | Disposition: A | Discharge status: Home / Self Care | Payer: No Typology Code available for payment source | Source: Ambulatory Visit | Attending: Licensed Practical Nurse | Admitting: Licensed Practical Nurse

## 2023-01-03 VITALS — BP 129/88 | HR 91 | Wt 104.9 lb

## 2023-01-03 DIAGNOSIS — F419 Anxiety disorder, unspecified: Secondary | ICD-10-CM

## 2023-01-03 DIAGNOSIS — Z124 Encounter for screening for malignant neoplasm of cervix: Secondary | ICD-10-CM | POA: Diagnosis present

## 2023-01-03 DIAGNOSIS — Z01419 Encounter for gynecological examination (general) (routine) without abnormal findings: Secondary | ICD-10-CM | POA: Insufficient documentation

## 2023-01-03 DIAGNOSIS — Z8669 Personal history of other diseases of the nervous system and sense organs: Secondary | ICD-10-CM
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
## 2023-01-08 LAB — CYTOLOGY - PAP
Chlamydia: NEGATIVE
Comment: NEGATIVE
Comment: NEGATIVE
Comment: NORMAL
Diagnosis: UNDETERMINED — AB
High risk HPV: NEGATIVE
Neisseria Gonorrhea: NEGATIVE

## 2023-03-05 ENCOUNTER — Ambulatory Visit: Payer: No Typology Code available for payment source

## 2023-03-05 VITALS — BP 110/66 | HR 73 | Ht 60.0 in | Wt 112.0 lb

## 2023-03-05 DIAGNOSIS — Z3042 Encounter for surveillance of injectable contraceptive: Secondary | ICD-10-CM | POA: Diagnosis not present

## 2023-03-05 MED ORDER — MEDROXYPROGESTERONE ACETATE 150 MG/ML IM SUSY
150.0000 mg | PREFILLED_SYRINGE | Freq: Once | INTRAMUSCULAR | Status: AC
  Administered 2023-03-05: 150 mg via INTRAMUSCULAR

## 2023-03-05 NOTE — Progress Notes (Signed)
    NURSE VISIT NOTE  Subjective:    Patient ID: Candice Villarreal, female    DOB: [birth year], 22 y.o.   MRN: [id number]  HPI  Patient is a 22 y.o. G22P1001 female who presents for depo provera injection.   Objective:    BP 110/66   Pulse 73   Ht 5' (1.524 m)   Wt 112 lb (50.8 kg)   LMP  (LMP Unknown)   Breastfeeding No   BMI 21.87 kg/m   Last Annual: 01/03/23. Last pap: 01/03/23. Last Depo-Provera: 12/12/22. Side Effects if any: none. Serum HCG indicated? No . Depo-Provera 150 mg IM given by: Beverely Pace, CMA. Site: Right Deltoid    Assessment:   1. Surveillance for Depo-Provera contraception   2. Encounter for Depo-Provera contraception      Plan:   Next appointment due between SEPT-23 and OCT-7.    Loney Laurence, CMA

## 2023-05-02 ENCOUNTER — Other Ambulatory Visit: Payer: Self-pay

## 2023-05-02 ENCOUNTER — Encounter: Payer: Self-pay | Admitting: Emergency Medicine

## 2023-05-02 ENCOUNTER — Emergency Department: Payer: No Typology Code available for payment source

## 2023-05-02 ENCOUNTER — Emergency Department
Admission: EM | Admit: 2023-05-02 | Discharge: 2023-05-02 | Disposition: A | Discharge status: Home / Self Care | Payer: No Typology Code available for payment source | Attending: Emergency Medicine | Admitting: Emergency Medicine

## 2023-05-02 DIAGNOSIS — R079 Chest pain, unspecified: Secondary | ICD-10-CM | POA: Diagnosis present

## 2023-05-02 DIAGNOSIS — R202 Paresthesia of skin: Secondary | ICD-10-CM | POA: Insufficient documentation

## 2023-05-02 LAB — TROPONIN I (HIGH SENSITIVITY)
Troponin I (High Sensitivity): <2 ng/L (ref <18)
Troponin I (High Sensitivity): <2 ng/L (ref <18)

## 2023-05-02 LAB — D-DIMER, QUANTITATIVE: D-Dimer, Quant: <0.27 ug{FEU}/mL (ref 0.00–0.50)

## 2023-05-02 LAB — BASIC METABOLIC PANEL
Anion gap: 9 (ref 5–15)
BUN: 21 mg/dL — ABNORMAL HIGH (ref 6–20)
CO2: 20 mmol/L — ABNORMAL LOW (ref 22–32)
Calcium: 9.5 mg/dL (ref 8.9–10.3)
Chloride: 108 mmol/L (ref 98–111)
Creatinine, Ser: 0.67 mg/dL (ref 0.44–1.00)
GFR, Estimated: >60 mL/min (ref >60)
Glucose, Bld: 88 mg/dL (ref 70–99)
Potassium: 3.7 mmol/L (ref 3.5–5.1)
Sodium: 137 mmol/L (ref 135–145)

## 2023-05-02 LAB — CBC
HCT: 43 % (ref 36.0–46.0)
Hemoglobin: 15.3 g/dL — ABNORMAL HIGH (ref 12.0–15.0)
MCH: 29.1 pg (ref 26.0–34.0)
MCHC: 35.6 g/dL (ref 30.0–36.0)
MCV: 81.7 fL (ref 80.0–100.0)
Platelets: 315 10*3/uL (ref 150–400)
RBC: 5.26 MIL/uL — ABNORMAL HIGH (ref 3.87–5.11)
RDW: 12 % (ref 11.5–15.5)
WBC: 7.2 10*3/uL (ref 4.0–10.5)
nRBC: 0 % (ref 0.0–0.2)

## 2023-05-02 MED ORDER — ALUM & MAG HYDROXIDE-SIMETH 200-200-20 MG/5ML PO SUSP
30.0000 mL | Freq: Once | ORAL | Status: AC
  Administered 2023-05-02: 30 mL via ORAL
  Filled 2023-05-02: qty 30

## 2023-05-02 MED ORDER — ASPIRIN 81 MG PO CHEW
324.0000 mg | CHEWABLE_TABLET | Freq: Once | ORAL | Status: AC
  Administered 2023-05-02: 324 mg via ORAL
  Filled 2023-05-02: qty 4

## 2023-05-02 MED ORDER — LIDOCAINE VISCOUS HCL 2 % MT SOLN
15.0000 mL | Freq: Once | OROMUCOSAL | Status: AC
  Administered 2023-05-02: 15 mL via ORAL
  Filled 2023-05-02: qty 15

## 2023-05-02 NOTE — Discharge Instructions (Signed)
Please seek medical attention for any high fevers, chest pain, shortness of breath, change in behavior, persistent vomiting, bloody stool or any other new or concerning symptoms.  
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
## 2023-05-02 NOTE — ED Notes (Signed)
Pt to ED with CP since 1-2 hour ago and also low grade fever and migraine, here with toddler, pt has hx cardiac surgery 2 years ago for PFO, which was discovered after she had a stroke, pt has mesh to that area to close the PFO. Pt states feels somewhat winded. Face appears flushed.

## 2023-05-02 NOTE — ED Triage Notes (Signed)
Pt arrived via POV with reports of chest pain that started around 0620, pt states she was driving to work when the pain started, pt went to work but left.  Pt also c/o migraine HA  Pt c/o shortness of breath.

## 2023-05-21 ENCOUNTER — Ambulatory Visit (INDEPENDENT_AMBULATORY_CARE_PROVIDER_SITE_OTHER): Payer: No Typology Code available for payment source

## 2023-05-21 VITALS — BP 133/80 | HR 89 | Ht 60.0 in | Wt 115.7 lb

## 2023-05-21 DIAGNOSIS — Z3042 Encounter for surveillance of injectable contraceptive: Secondary | ICD-10-CM

## 2023-05-21 MED ORDER — MEDROXYPROGESTERONE ACETATE 150 MG/ML IM SUSP
150.0000 mg | Freq: Once | INTRAMUSCULAR | Status: AC
  Administered 2023-05-21: 150 mg via INTRAMUSCULAR

## 2023-05-21 NOTE — Progress Notes (Addendum)
NURSE VISIT NOTE  Subjective:    Patient ID: Candice Villarreal, female    DOB: [birth year], 22 y.o.   MRN: [id number]  HPI  Patient is a 22 y.o. G82P1001 female who presents for depo provera injection. Patient states she had a period this month that lasted two weeks, stopped for five days, then she bleed again for two days. She states this happened after a visit to the Emergency department for heart issues. She was placed on blood thinners.   Objective:    BP 133/80   Pulse 89   Ht 5' (1.524 m)   Wt 115 lb 11.2 oz (52.5 kg)   LMP 05/02/2023 (Exact Date)   BMI 22.60 kg/m   Last Annual: 01/03/23. Last pap: 01/03/23. Last Depo-Provera: 03/05/23. Side Effects if any: none. Serum HCG indicated? No . Depo-Provera 150 mg IM given by: Rocco Serene, LPN. Site: Left Deltoid  Lab Review    Assessment:   1. Surveillance for Depo-Provera contraception      Plan:   Next appointment due between 08/06/23 and 08/20/23.  Patient advised to report any additional episodes of irregular or break thru bleeding.  Rocco Serene, LPN

## 2023-05-21 NOTE — Patient Instructions (Signed)

## 2023-08-07 ENCOUNTER — Ambulatory Visit (INDEPENDENT_AMBULATORY_CARE_PROVIDER_SITE_OTHER): Payer: Medicaid Other

## 2023-08-07 VITALS — BP 126/75 | HR 90 | Ht 60.0 in | Wt 115.0 lb

## 2023-08-07 DIAGNOSIS — Z3042 Encounter for surveillance of injectable contraceptive: Secondary | ICD-10-CM

## 2023-08-07 MED ORDER — MEDROXYPROGESTERONE ACETATE 150 MG/ML IM SUSY
150.0000 mg | PREFILLED_SYRINGE | Freq: Once | INTRAMUSCULAR | Status: AC
  Administered 2023-08-07: 150 mg via INTRAMUSCULAR

## 2023-08-07 NOTE — Progress Notes (Signed)
    NURSE VISIT NOTE  Subjective:    Patient ID: Candice Villarreal, female    DOB: 2000/11/13, 22 y.o.   MRN: [id number]  HPI  Patient is a 22 y.o. G80P1001 female who presents for depo provera injection.   Objective:    BP 126/75   Pulse 90   Ht 5' (1.524 m)   Wt 115 lb (52.2 kg)   BMI 22.46 kg/m   Last Annual: 01/03/23. Last pap: 01/03/23. Last Depo-Provera: 05/21/23. Side Effects if any: none. Serum HCG indicated? No . Depo-Provera 150 mg IM given by: Beverely Pace, CMA. Site: Right Ventrogluteal    Assessment:   1. Surveillance for Depo-Provera contraception      Plan:   Next appointment due between FEB 25-MAR 11     Loney Laurence, CMA

## 2023-10-30 ENCOUNTER — Ambulatory Visit: Payer: Medicaid Other

## 2023-11-05 ENCOUNTER — Ambulatory Visit (INDEPENDENT_AMBULATORY_CARE_PROVIDER_SITE_OTHER): Payer: Medicaid Other

## 2023-11-05 VITALS — Ht 60.0 in | Wt 118.8 lb

## 2023-11-05 DIAGNOSIS — Z3042 Encounter for surveillance of injectable contraceptive: Secondary | ICD-10-CM

## 2023-11-05 DIAGNOSIS — Z30011 Encounter for initial prescription of contraceptive pills: Secondary | ICD-10-CM

## 2023-11-05 MED ORDER — MEDROXYPROGESTERONE ACETATE 150 MG/ML IM SUSP: 150.0000 mg | Freq: Once | INTRAMUSCULAR | Status: DC

## 2023-11-05 MED ORDER — NORETHINDRONE 0.35 MG PO TABS
1.0000 | ORAL_TABLET | Freq: Every day | ORAL | 2 refills | Status: DC
Start: 2023-11-05 — End: 2023-11-05

## 2023-11-05 MED ORDER — NORETHINDRONE 0.35 MG PO TABS
1.0000 | ORAL_TABLET | Freq: Every day | ORAL | 2 refills | Status: DC
Start: 2023-11-05 — End: 2024-01-08

## 2023-11-05 NOTE — Progress Notes (Signed)
 Patient presented today for a Depo nurse visit. Upon discussing her prior experience with Depo she voiced she would like to change to a non-injection birth control. Patient previously has seen Isabelle Course who was in office today. Patient will now be starting Micronor due to history of stroke. Advised we will send in a 3 month rx and she should return in the end of May for an annual and POP follow-up. She voiced understanding and appreciation for this change.

## 2023-12-11 ENCOUNTER — Telehealth: Payer: Self-pay

## 2023-12-11 NOTE — Telephone Encounter (Signed)
 TRIAGE VOICEMAIL: Patient states she does like the way the norethindrone (MICRONOR) 0.35 MG tablet is making her feel and would like to discuss it. She is currently at work, but able to step out when we return the call.

## 2023-12-11 NOTE — Telephone Encounter (Signed)
 Patient states she noticed she has gained some weight, her acne is back and it's affecting her mood. She's having really bad mood swings. She crys for no reason. She also noticed she is having sleep disturbance (she can't wake up even with alarm). She states her nipples have been hurting as well. She has finished one pack and has started the second pack (is about half way thru). She would like to go back to depo. Advised Lonzell Robin is out of the office today and will return tomorrow.

## 2024-01-01 ENCOUNTER — Other Ambulatory Visit: Payer: Self-pay

## 2024-01-01 ENCOUNTER — Emergency Department
Admission: EM | Admit: 2024-01-01 | Discharge: 2024-01-01 | Disposition: A | Discharge status: Home / Self Care | Attending: Emergency Medicine | Admitting: Emergency Medicine

## 2024-01-01 DIAGNOSIS — R1030 Lower abdominal pain, unspecified: Secondary | ICD-10-CM | POA: Diagnosis present

## 2024-01-01 DIAGNOSIS — Y92410 Unspecified street and highway as the place of occurrence of the external cause: Secondary | ICD-10-CM | POA: Diagnosis not present

## 2024-01-01 LAB — URINALYSIS, ROUTINE W REFLEX MICROSCOPIC
Bacteria, UA: NONE SEEN
Bilirubin Urine: NEGATIVE
Glucose, UA: NEGATIVE mg/dL
Hgb urine dipstick: NEGATIVE
Ketones, ur: NEGATIVE mg/dL
Leukocytes,Ua: NEGATIVE
Nitrite: NEGATIVE
Protein, ur: 100 mg/dL — AB
Specific Gravity, Urine: 1.021 (ref 1.005–1.030)
pH: 6 (ref 5.0–8.0)

## 2024-01-01 LAB — POC URINE PREG, ED: Preg Test, Ur: NEGATIVE
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
## 2024-01-01 NOTE — Discharge Instructions (Signed)
 The pregnancy test was negative today and the urinalysis did not show any signs of infection. Given the abdominal pain you are experiencing, I would recommend further evaluation with a CT scan. If you have any worsening pain you can return to the ED at any time to have this done.  You will likely be more sore tomorrow that you are today, this is normal. After this your pain should slowly be improving. If not, you need to be seen by another health care provider. This could be your PCP, urgent care or in the emergency department. Return to the emergency department specifically, if you have development of chest pain, shortness of breath, abdominal pain, new abdominal bruising or any other symptom personally concerning to you.

## 2024-01-08 ENCOUNTER — Encounter: Payer: Self-pay | Admitting: Licensed Practical Nurse

## 2024-01-08 ENCOUNTER — Ambulatory Visit (INDEPENDENT_AMBULATORY_CARE_PROVIDER_SITE_OTHER): Admitting: Licensed Practical Nurse

## 2024-01-08 ENCOUNTER — Other Ambulatory Visit (HOSPITAL_COMMUNITY)
Admission: RE | Admit: 2024-01-08 | Discharge: 2024-01-08 | Disposition: A | Discharge status: Home / Self Care | Source: Ambulatory Visit | Attending: Licensed Practical Nurse | Admitting: Licensed Practical Nurse

## 2024-01-08 VITALS — BP 130/74 | HR 105

## 2024-01-08 DIAGNOSIS — Z124 Encounter for screening for malignant neoplasm of cervix: Secondary | ICD-10-CM

## 2024-01-08 DIAGNOSIS — Z1329 Encounter for screening for other suspected endocrine disorder: Secondary | ICD-10-CM

## 2024-01-08 DIAGNOSIS — Z01419 Encounter for gynecological examination (general) (routine) without abnormal findings: Secondary | ICD-10-CM | POA: Diagnosis present

## 2024-01-08 DIAGNOSIS — Z3169 Encounter for other general counseling and advice on procreation: Secondary | ICD-10-CM

## 2024-01-08 DIAGNOSIS — Z113 Encounter for screening for infections with a predominantly sexual mode of transmission: Secondary | ICD-10-CM

## 2024-01-08 DIAGNOSIS — N644 Mastodynia: Secondary | ICD-10-CM

## 2024-01-08 NOTE — Progress Notes (Signed)
 Gynecology Annual Exam  PCP: [facility]  Chief Complaint:  Chief Complaint  Patient presents with   Gynecologic Exam    History of Present Illness: Patient is a 23 y.o. G1P1001 presents for annual exam. The patient has complaints today.   -Breast tenderness x 2weeks, it is mostly present on the Left breast, it is tender when ever that area is touched, she also noticed something firm there. She has a little tenderness in the right breast too.   -Diarrhea x2 weeks, today she had 3 liquid stools, she is able to eat, has occasional nausea in the mornings. She has had similar complaints in the past, she has not been able to see other providers d/t her schedule. She now has a new job that allows her time to take off for medical appointments, she plans to fu with a PCP.  -Headaches: last headache was over 1 month ago, OTC Migraine medicine helps  -Hot flashes, hot/cold intolerance: has episodes of feeling hot for no reason, there are times she will feel cold when she knows the room is warm.     -She recently stopped birth control as she would like to become pregnant. She has a very loving partner and they desire to start a family, she does not have any concerns about becoming pregnant. She did have  a stroke d/t PFO and a subsequent surgery. She had a healthy pregnancy after that. She is not sure if she had chicken pox or the vaccine. She does not use tobacco/nicotine, alcohol, or illicit drugs.   LMP: Patient's last menstrual period was 12/10/2023 (exact date). Average Interval: regular, 28 days Intermenstrual Bleeding: no Postcoital Bleeding: no Dysmenorrhea: no  The patient is sexually active with one female partner. She currently uses none for contraception. She denies dyspareunia.  The patient does perform self breast exams.  There is no notable family history of breast or ovarian cancer in her family.  The patient wears seatbelts: yes.  The patient has regular  exercise: yes.  Chases toddlers   The patient repots current symptoms of depression.  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   Review of Systems: ROS see HPI   Past Medical History:  Patient Active Problem List   Diagnosis Date Noted Date Diagnosed   Surveillance for Depo-Provera  contraception 12/12/2022    TIA (transient ischemic attack)     PFO (patent foramen ovale) 10/20/2019     See MFM recommendations in overview for supervision of pregnancy with hx of PFO.    Numbness and tingling of right side of face 10/20/2019    Tobacco use disorder 10/19/2019     Past Surgical History:  Past Surgical History:  Procedure Laterality Date   APPENDECTOMY     LAPAROSCOPIC APPENDECTOMY N/A 01/28/2019   Procedure: APPENDECTOMY LAPAROSCOPIC;  Surgeon: Franki Isles, MD;  Location: [facility] ORS;  Service: General;  Laterality: N/A;   PATENT FORAMEN OVALE(PFO) CLOSURE N/A 02/25/2020   Procedure: PATENT FORAMEN OVALE (PFO) CLOSURE;  Surgeon: Arnoldo Lapping, MD;  Location: [facility]  INVASIVE CV LAB;  Service: Cardiovascular;  Laterality: N/A;   TEE WITHOUT CARDIOVERSION N/A 10/21/2019   Procedure: TRANSESOPHAGEAL ECHOCARDIOGRAM (TEE);  Surgeon: Sammy Crisp, MD;  Location: [facility] ORS;  Service: Cardiovascular;  Laterality: N/A;    Gynecologic History:  Patient's last menstrual period was 12/10/2023 (exact date). Contraception: none Last Pap: Results were: 2024 ASCUS with NEGATIVE high risk HPV   Obstetric History: G1P1001  Family History:  Family History  Problem Relation Age of Onset   Hypertension Mother     Hyperlipidemia Mother    Cancer Maternal Grandmother    Breast cancer Maternal Grandmother 74   Cancer Maternal Grandfather    Colon cancer Maternal Grandfather 76    Social History:  Social History   Socioeconomic History   Marital status: Single    Spouse name: Fabian Holster   Number of children: Not on file   Years of education: Not on file   Highest education level: Not on file  Occupational History   Not on file  Tobacco Use   Smoking status: Former    Current packs/day: 0.00    Types: Cigarettes    Quit date: 11/24/2019    Years since quitting: 4.1   Smokeless tobacco: Never  Vaping Use   Vaping status: Some Days  Substance and Sexual Activity   Alcohol use: Not Currently   Drug use: Not Currently    Types: Marijuana    Comment: Stopping using in 2021   Sexual activity: Yes    Birth control/protection: None  Other Topics Concern   Not on file  Social History Narrative   Not on file   Social Drivers of Health   Financial Resource Strain: Low Risk  (01/28/2019)   Overall Financial Resource Strain (CARDIA)    Difficulty of Paying Living Expenses: Not hard at all  Food Insecurity: No Food Insecurity (01/28/2019)   Hunger Vital Sign    Worried About Running Out of Food in the Last Year: Never true    Ran Out of Food in the Last Year: Never true  Transportation Needs: No Transportation Needs (01/28/2019)   PRAPARE - Administrator, Civil Service (Medical): No    Lack of Transportation (Non-Medical): No  Physical Activity: Sufficiently Active (01/28/2019)   Exercise Vital Sign    Days of Exercise per Week: 5 days    Minutes of Exercise per Session: 60 min  Stress: Stress Concern Present (01/28/2019)   [employer] of Occupational Health - Occupational Stress Questionnaire    Feeling of Stress : To some extent  Social Connections: Unknown (01/28/2019)   Social Connection and Isolation Panel [NHANES]    Frequency of Communication with Friends and Family: More  than three times a week    Frequency of Social Gatherings with Friends and Family: More than three times a week    Attends Religious Services: Patient declined    Database administrator or Organizations: Patient declined    Attends Banker Meetings: Patient declined    Marital Status: Living with partner  Intimate Partner Violence: Not At Risk (01/28/2019)   Humiliation, Afraid, Rape, and Kick questionnaire    Fear of Current or Ex-Partner: No    Emotionally Abused: No    Physically Abused: No    Sexually Abused: No    Allergies:  No Known Allergies  Medications: Prior to Admission medications   Medication Sig Start Date End Date Taking? Authorizing Provider  aspirin  EC 81 MG tablet Take 1 tablet (81  mg total) by mouth daily. Swallow whole. Patient not taking: Reported on 01/03/2023 02/18/20   Ardia Kraft, PA-C  aspirin -acetaminophen -caffeine (EXCEDRIN MIGRAINE) 250-250-65 MG tablet Take 1 tablet by mouth every 6 (six) hours as needed for headache. Patient not taking: Reported on 01/08/2024    [provider]  ibuprofen  (ADVIL ) 600 MG tablet Take 1 tablet (600 mg total) by mouth every 6 (six) hours. Patient not taking: Reported on 05/21/2023 04/08/21   Alicia Apa, CNM  Prenatal Vit-Fe Fumarate-FA (MULTIVITAMIN-PRENATAL) 27-0.8 MG TABS tablet Take 1 tablet by mouth daily at 12 noon. Patient not taking: Reported on 01/08/2024    [provider]    Physical Exam Vitals: Blood pressure 130/74, pulse (!) 105, last menstrual period 12/10/2023.  General: NAD HEENT: normocephalic, anicteric Thyroid: no enlargement, no palpable nodules Pulmonary: No increased work of breathing, CTAB Cardiovascular: RRR, distal pulses 2+ Breast: Breast symmetrical, no palpable nodules or masses, no skin or nipple retraction present, no nipple discharge.  No axillary or supraclavicular lymphadenopathy. L breast: tenderness note in upper outer quadrant, firm diffuse  area palpation at site of tenderness.  R breast some tenderness over outer quadrant, no masses.  Abdomen: NABS, soft, non-tender, non-distended.  Umbilicus without lesions.  No hepatomegaly, splenomegaly or masses palpable. No evidence of hernia  Genitourinary:  External: Normal external female genitalia.  Normal urethral meatus, normal Bartholin's and Skene's glands.    Vagina: Normal vaginal mucosa, no evidence of prolapse.  Good tone   Cervix: Grossly normal in appearance, no bleeding  Uterus: Non-enlarged, mobile, normal contour.  No CMT  Adnexa: ovaries non-enlarged, no adnexal masses  Rectal: deferred  Lymphatic: no evidence of inguinal lymphadenopathy Extremities: no edema, erythema, or tenderness Neurologic: Grossly intact Psychiatric: mood appropriate, affect full  Female chaperone present for pelvic and breast  portions of the physical exam    Assessment: 23 y.o. G1P1001 routine annual exam  Plan: Problem List Items Addressed This Visit   None Visit Diagnoses       Breast tenderness    -  Primary   Relevant Orders   US  LIMITED ULTRASOUND INCLUDING AXILLA LEFT BREAST      Well woman exam       Relevant Orders   HEP, RPR, HIV Panel   Hepatitis C antibody   TSH + free T4   CBC w/Diff/Platelet   Varicella zoster antibody, IgG   Rubella screen   Cytology - PAP     Encounter for preconception consultation       Relevant Orders   Varicella zoster antibody, IgG   Rubella screen     Screening for thyroid disorder       Relevant Orders   TSH + free T4     Screening for cervical cancer       Relevant Orders   Cytology - PAP     Screening examination for venereal disease       Relevant Orders   HEP, RPR, HIV Panel   Cytology - PAP       1) 4) Gardasil Series discussed and if applicable offered to patient - Patient has previously completed 3 shot series   2) STI screening  wasoffered and accepted  3)  ASCCP guidelines and rational discussed.  Patient opts for  based on results screening interval  4) Contraception - the patient is currently using  none.  She is attempting to conceive in the near future We discussed safe sex practices to reduce her furture risk  of STI's.    5) Preconception-Rubella and Varicella titers collected, rec PNV and Folic Acid supplement  6) Return once pregnancy    Anice Kerbs, CNM  [facility] 01/08/2024, 3:33 PM

## 2024-01-09 LAB — CBC WITH DIFFERENTIAL/PLATELET
Basophils Absolute: 0.1 10*3/uL (ref 0.0–0.2)
Basos: 1 %
EOS (ABSOLUTE): 0.3 10*3/uL (ref 0.0–0.4)
Eos: 2 %
Hematocrit: 42.8 % (ref 34.0–46.6)
Hemoglobin: 14.2 g/dL (ref 11.1–15.9)
Immature Grans (Abs): 0 10*3/uL (ref 0.0–0.1)
Immature Granulocytes: 0 %
Lymphocytes Absolute: 2.5 10*3/uL (ref 0.7–3.1)
Lymphs: 23 %
MCH: 28.9 pg (ref 26.6–33.0)
MCHC: 33.2 g/dL (ref 31.5–35.7)
MCV: 87 fL (ref 79–97)
Monocytes Absolute: 0.6 10*3/uL (ref 0.1–0.9)
Monocytes: 6 %
Neutrophils Absolute: 7.6 10*3/uL — ABNORMAL HIGH (ref 1.4–7.0)
Neutrophils: 68 %
Platelets: 320 10*3/uL (ref 150–450)
RBC: 4.92 x10E6/uL (ref 3.77–5.28)
RDW: 12.8 % (ref 11.7–15.4)
WBC: 11 10*3/uL — ABNORMAL HIGH (ref 3.4–10.8)

## 2024-01-09 LAB — VARICELLA ZOSTER ANTIBODY, IGG: Varicella zoster IgG: REACTIVE

## 2024-01-09 LAB — HEPATITIS C ANTIBODY: Hep C Virus Ab: NONREACTIVE

## 2024-01-09 LAB — RUBELLA SCREEN: Rubella Antibodies, IGG: 2.27 {index} (ref >0.99)

## 2024-01-09 LAB — HEP, RPR, HIV PANEL
HIV Screen 4th Generation wRfx: NONREACTIVE
Hepatitis B Surface Ag: NEGATIVE
RPR Ser Ql: NONREACTIVE

## 2024-01-09 LAB — TSH+FREE T4
Free T4: 0.99 ng/dL (ref 0.82–1.77)
TSH: 1.75 u[IU]/mL (ref 0.450–4.500)

## 2024-01-10 ENCOUNTER — Ambulatory Visit: Payer: Self-pay | Admitting: Licensed Practical Nurse

## 2024-01-11 LAB — CYTOLOGY - PAP
Chlamydia: NEGATIVE
Comment: NEGATIVE
Comment: NEGATIVE
Comment: NORMAL
Diagnosis: NEGATIVE
Neisseria Gonorrhea: NEGATIVE
Trichomonas: NEGATIVE

## 2024-01-14 ENCOUNTER — Ambulatory Visit
Admission: RE | Admit: 2024-01-14 | Discharge: 2024-01-14 | Disposition: A | Discharge status: Home / Self Care | Source: Ambulatory Visit | Attending: Licensed Practical Nurse | Admitting: Licensed Practical Nurse

## 2024-01-14 DIAGNOSIS — N644 Mastodynia: Secondary | ICD-10-CM | POA: Insufficient documentation

## 2024-01-17 ENCOUNTER — Ambulatory Visit (INDEPENDENT_AMBULATORY_CARE_PROVIDER_SITE_OTHER)

## 2024-01-17 VITALS — BP 113/77 | HR 88 | Ht 60.0 in | Wt 125.3 lb

## 2024-01-17 DIAGNOSIS — N912 Amenorrhea, unspecified: Secondary | ICD-10-CM

## 2024-01-17 DIAGNOSIS — O3680X0 Pregnancy with inconclusive fetal viability, not applicable or unspecified: Secondary | ICD-10-CM

## 2024-01-17 DIAGNOSIS — Z3201 Encounter for pregnancy test, result positive: Secondary | ICD-10-CM

## 2024-01-17 LAB — POCT URINE PREGNANCY: Preg Test, Ur: POSITIVE — AB

## 2024-01-17 NOTE — Progress Notes (Signed)
    NURSE VISIT NOTE  Subjective:    Patient ID: Candice Villarreal, female    DOB: 01-23-2001, 23 y.o.   MRN: [id number]  HPI  Patient is a 23 y.o. G47P1001 female who presents for evaluation of amenorrhea. She believes she could be pregnant. Pregnancy is desired. Sexual Activity: single partner, contraception: OCP (estrogen/progesterone). Current symptoms also include: breast tenderness, fatigue, frequent urination, positive home pregnancy test, and discharge. Last period was normal.    Objective:    BP 113/77   Pulse 88   Ht 5' (1.524 m)   Wt 125 lb 4.8 oz (56.8 kg)   LMP 12/10/2023 (Exact Date)   BMI 24.47 kg/m   Lab Review  Results for orders placed or performed in visit on 01/17/24  POCT urine pregnancy  Result Value Ref Range   Preg Test, Ur Positive (A) Negative    Assessment:   1. Amenorrhea     Plan:   Pregnancy Test: Positive  Estimated Date of Delivery: None noted. BP Cuff Measurement taken. Cuff Size Adult Small Encouraged well-balanced diet, plenty of rest when needed, pre-natal vitamins daily and walking for exercise.  Discussed self-help for nausea, avoiding OTC medications until consulting provider or pharmacist, other than Tylenol  as needed, minimal caffeine (1-2 cups daily) and avoiding alcohol.   She will schedule her nurse visit @ 7-[redacted] wks pregnant, u/s for dating @10  wk, and NOB visit at [redacted] wk pregnant.    Feel free to call with any questions.     Doneen Fuelling, CMA [facility]

## 2024-01-17 NOTE — Patient Instructions (Signed)
 Common Medications Safe in Pregnancy  Acne:      Constipation:  Benzoyl Peroxide     Colace  Clindamycin      Dulcolax Suppository  Topica Erythromycin     Fibercon  Salicylic Acid      Metamucil         Miralax AVOID:        Senakot   Accutane    Cough:  Retin-A       Cough Drops  Tetracycline      Phenergan w/ Codeine if Rx  Minocycline      Robitussin (Plain & DM)  Antibiotics:     Crabs/Lice:  Ceclor       RID  Cephalosporins    AVOID:  E-Mycins      Kwell  Keflex  Macrobid/Macrodantin   Diarrhea:  Penicillin      Kao-Pectate  Zithromax      Imodium AD         PUSH FLUIDS AVOID:       Cipro     Fever:  Tetracycline      Tylenol (Regular or Extra  Minocycline       Strength)  Levaquin      Extra Strength-Do not          Exceed 8 tabs/24 hrs Caffeine:        200mg /day (equiv. To 1 cup of coffee or  approx. 3 12 oz sodas)         Gas: Cold/Hayfever:       Gas-X  Benadryl      Mylicon  Claritin       Phazyme  **Claritin-D        Chlor-Trimeton    Headaches:  Dimetapp      ASA-Free Excedrin  Drixoral-Non-Drowsy     Cold Compress  Mucinex (Guaifenasin)     Tylenol (Regular or Extra  Sudafed/Sudafed-12 Hour     Strength)  **Sudafed PE Pseudoephedrine   Tylenol Cold & Sinus     Vicks Vapor Rub  Zyrtec  **AVOID if Problems With Blood Pressure         Heartburn: Avoid lying down for at least 1 hour after meals  Aciphex      Maalox     Rash:  Milk of Magnesia     Benadryl    Mylanta       1% Hydrocortisone Cream  Pepcid  Pepcid Complete   Sleep Aids:  Prevacid      Ambien   Prilosec       Benadryl  Rolaids       Chamomile Tea  Tums (Limit 4/day)     Unisom         Tylenol PM         Warm milk-add vanilla or  Hemorrhoids:       Sugar for taste  Anusol/Anusol H.C.  (RX: Analapram 2.5%)  Sugar Substitutes:  Hydrocortisone OTC     Ok in moderation  Preparation H      Tucks        Vaseline lotion applied to tissue with  wiping    Herpes:     Throat:  Acyclovir      Oragel  Famvir  Valtrex     Vaccines:         Flu Shot Leg Cramps:       *Gardasil  Benadryl      Hepatitis A         Hepatitis B Nasal Spray:  Pneumovax  Saline Nasal Spray     Polio Booster         Tetanus Nausea:       Tuberculosis test or PPD  Vitamin B6 25 mg TID   AVOID:    Dramamine      *Gardasil  Emetrol       Live Poliovirus  Ginger Root 250 mg QID    MMR (measles, mumps &  High Complex Carbs @ Bedtime    rebella)  Sea Bands-Accupressure    Varicella (Chickenpox)  Unisom 1/2 tab TID     *No known complications           If received before Pain:         Known pregnancy;   Darvocet       Resume series after  Lortab        Delivery  Percocet    Yeast:   Tramadol      Femstat   Tylenol 3      Gyne-lotrimin  Ultram       Monistat  Vicodin           MISC:         All Sunscreens           Hair Coloring/highlights          Insect Repellant's          (Including DEET)         Mystic Tans

## 2024-01-31 NOTE — Telephone Encounter (Signed)
 Patient seen 01-08-24

## 2024-02-14 ENCOUNTER — Ambulatory Visit
Admission: RE | Admit: 2024-02-14 | Discharge: 2024-02-14 | Disposition: A | Discharge status: Home / Self Care | Source: Ambulatory Visit | Attending: Licensed Practical Nurse | Admitting: Licensed Practical Nurse

## 2024-02-14 DIAGNOSIS — O3680X0 Pregnancy with inconclusive fetal viability, not applicable or unspecified: Secondary | ICD-10-CM | POA: Diagnosis present

## 2024-02-15 ENCOUNTER — Other Ambulatory Visit

## 2024-02-15 ENCOUNTER — Telehealth

## 2024-02-15 DIAGNOSIS — Z3490 Encounter for supervision of normal pregnancy, unspecified, unspecified trimester: Secondary | ICD-10-CM

## 2024-02-15 DIAGNOSIS — Z3A00 Weeks of gestation of pregnancy not specified: Secondary | ICD-10-CM

## 2024-02-15 NOTE — Patient Instructions (Addendum)
 Common Medications Safe in Pregnancy  Acne:      Constipation:  Benzoyl Peroxide     Colace  Clindamycin      Dulcolax Suppository  Topica Erythromycin     Fibercon  Salicylic Acid      Metamucil         Miralax AVOID:        Senakot   Accutane    Cough:  Retin-A       Cough Drops  Tetracycline      Phenergan w/ Codeine if Rx  Minocycline      Robitussin (Plain & DM)  Antibiotics:     Crabs/Lice:  Ceclor       RID  Cephalosporins    AVOID:  E-Mycins      Kwell  Keflex  Macrobid/Macrodantin   Diarrhea:  Penicillin      Kao-Pectate  Zithromax      Imodium AD         PUSH FLUIDS AVOID:       Cipro     Fever:  Tetracycline      Tylenol (Regular or Extra  Minocycline       Strength)  Levaquin      Extra Strength-Do not          Exceed 8 tabs/24 hrs Caffeine:        200mg /day (equiv. To 1 cup of coffee or  approx. 3 12 oz sodas)         Gas: Cold/Hayfever:       Gas-X  Benadryl      Mylicon  Claritin       Phazyme  **Claritin-D        Chlor-Trimeton    Headaches:  Dimetapp      ASA-Free Excedrin  Drixoral-Non-Drowsy     Cold Compress  Mucinex (Guaifenasin)     Tylenol (Regular or Extra  Sudafed/Sudafed-12 Hour     Strength)  **Sudafed PE Pseudoephedrine   Tylenol Cold & Sinus     Vicks Vapor Rub  Zyrtec  **AVOID if Problems With Blood Pressure         Heartburn: Avoid lying down for at least 1 hour after meals  Aciphex      Maalox     Rash:  Milk of Magnesia     Benadryl    Mylanta       1% Hydrocortisone Cream  Pepcid  Pepcid Complete   Sleep Aids:  Prevacid      Ambien   Prilosec       Benadryl  Rolaids       Chamomile Tea  Tums (Limit 4/day)     Unisom         Tylenol PM         Warm milk-add vanilla or  Hemorrhoids:       Sugar for taste  Anusol/Anusol H.C.  (RX: Analapram 2.5%)  Sugar Substitutes:  Hydrocortisone OTC     Ok in moderation  Preparation H      Tucks        Vaseline lotion applied to tissue with  wiping    Herpes:     Throat:  Acyclovir      Oragel  Famvir  Valtrex     Vaccines:         Flu Shot Leg Cramps:       *Gardasil  Benadryl      Hepatitis A         Hepatitis B Nasal Spray:  Pneumovax  Saline Nasal Spray     Polio Booster         Tetanus Nausea:       Tuberculosis test or PPD  Vitamin B6 25 mg TID   AVOID:    Dramamine      *Gardasil  Emetrol       Live Poliovirus  Ginger Root 250 mg QID    MMR (measles, mumps &  High Complex Carbs @ Bedtime    rebella)  Sea Bands-Accupressure    Varicella (Chickenpox)  Unisom 1/2 tab TID     *No known complications           If received before Pain:         Known pregnancy;   Darvocet       Resume series after  Lortab        Delivery  Percocet    Yeast:   Tramadol      Femstat   Tylenol 3      Gyne-lotrimin  Ultram       Monistat  Vicodin           MISC:         All Sunscreens           Hair Coloring/highlights          Insect Repellant's          (Including DEET)         Mystic Tans

## 2024-02-15 NOTE — Progress Notes (Addendum)
 New OB Intake  I connected with  Candice Villarreal on 02/15/24 at  1:15 PM EDT by MyChart Video Visit and verified that I am speaking with the correct person using two identifiers. Nurse is located at [facility] and pt is located at home.  I discussed the limitations, risks, security and privacy concerns of performing an evaluation and management service by telephone and the availability of in person appointments. I also discussed with the patient that there may be a patient responsible charge related to this service. The patient expressed understanding and agreed to proceed.  I explained I am completing New OB Intake today. We discussed her EDD of 09/30/2024 that is based on LMP of 12/10/2023. Pt is G2/P1001. I reviewed her allergies, medications, Medical/Surgical/OB history, and appropriate screenings. There are cats in the home: yes. If yes: Indoor. Based on history, this is a/an pregnancy uncomplicated . She has no obstetrical history is significant.  Patient Active Problem List   Diagnosis Date Noted   TIA (transient ischemic attack)    PFO (patent foramen ovale) 10/20/2019   Numbness and tingling of right side of face 10/20/2019    Concerns addressed today: No concerns.  Delivery Plans:  Is unsure if she wants to deliver at [facility] or [facility].   Anatomy US  Explained that the anatomy US  will be scheduled around [redacted] weeks gestational age.  Labs Discussed genetic screening with patient. Patient consents to genetic testing to be drawn at new OB visit. Discussed possible labs to be drawn at new OB appointment.  COVID Vaccine Patient has not had COVID vaccine.   Social Determinants of Health Food Insecurity: denies food insecurity WIC Referral: Patient is interested in referral to [facility].  Transportation: Patient denies transportation needs. Childcare: Discussed no children allowed at ultrasound appointments.   First visit review I reviewed new OB appt  with pt. I explained she will have blood work and pap smear/pelvic exam if indicated. Explained to patient that she will need to be seen by a provider where she will deliver before her New OB physical is scheduled.   Aldona Amel, CMA 02/15/2024  1:11 PM

## 2024-09-30 ENCOUNTER — Observation Stay
Admission: EM | Admit: 2024-09-30 | Discharge: 2024-09-30 | Disposition: A | Discharge status: Home / Self Care | Attending: Certified Nurse Midwife | Admitting: Certified Nurse Midwife

## 2024-09-30 ENCOUNTER — Emergency Department: Admission: EM | Admit: 2024-09-30 | Discharge: 2024-09-30 | Disposition: A | Discharge status: Home / Self Care

## 2024-09-30 ENCOUNTER — Other Ambulatory Visit: Payer: Self-pay

## 2024-09-30 ENCOUNTER — Ambulatory Visit
Admission: EM | Admit: 2024-09-30 | Discharge: 2024-09-30 | Disposition: A | Discharge status: Other Acute Inpatient Hospital | Source: Home / Self Care

## 2024-09-30 ENCOUNTER — Encounter: Payer: Self-pay | Admitting: Obstetrics

## 2024-09-30 DIAGNOSIS — M549 Dorsalgia, unspecified: Secondary | ICD-10-CM

## 2024-09-30 DIAGNOSIS — W19XXXA Unspecified fall, initial encounter: Secondary | ICD-10-CM | POA: Insufficient documentation

## 2024-09-30 DIAGNOSIS — M545 Low back pain, unspecified: Secondary | ICD-10-CM | POA: Insufficient documentation

## 2024-09-30 DIAGNOSIS — W1830XA Fall on same level, unspecified, initial encounter: Secondary | ICD-10-CM

## 2024-09-30 DIAGNOSIS — Z3A20 20 weeks gestation of pregnancy: Secondary | ICD-10-CM | POA: Insufficient documentation

## 2024-09-30 DIAGNOSIS — R1012 Left upper quadrant pain: Secondary | ICD-10-CM | POA: Insufficient documentation

## 2024-09-30 DIAGNOSIS — W108XXA Fall (on) (from) other stairs and steps, initial encounter: Secondary | ICD-10-CM | POA: Insufficient documentation

## 2024-09-30 DIAGNOSIS — S300XXA Contusion of lower back and pelvis, initial encounter: Secondary | ICD-10-CM | POA: Insufficient documentation

## 2024-09-30 DIAGNOSIS — O9A212 Injury, poisoning and certain other consequences of external causes complicating pregnancy, second trimester: Secondary | ICD-10-CM | POA: Insufficient documentation

## 2024-09-30 DIAGNOSIS — Z6741 Type O blood, Rh negative: Secondary | ICD-10-CM | POA: Insufficient documentation

## 2024-09-30 DIAGNOSIS — Z7982 Long term (current) use of aspirin: Secondary | ICD-10-CM | POA: Insufficient documentation

## 2024-09-30 DIAGNOSIS — S5002XA Contusion of left elbow, initial encounter: Secondary | ICD-10-CM | POA: Insufficient documentation

## 2024-09-30 HISTORY — DX: Cerebral infarction, unspecified: I63.9

## 2024-09-30 NOTE — ED Triage Notes (Signed)
 Pt reports she slipped on ice and fell off porch this morning, pt reports pain to left buttocks. Pt is aprox [redacted] weeks pregnant. Pt was seen by L&D and her baby was cleared. Pt reports bruising to left buttocks and pain worse with walking.

## 2024-09-30 NOTE — ED Triage Notes (Addendum)
 This am fell down at bottom step backwards impact left lower back. Patient is 21 weeks into pregnancy. Had previous partum resulted in miscarriage in addition to heart condition (PFO) she reports is stable. Patient shared OB anaesthetist told her during epidural she has scoliosis.  Neck on left also tender. Patient requested and was provided an icepak.Provider notified. She is directed to L&D for fetal monitoring and higher level of care.

## 2024-09-30 NOTE — Discharge Instructions (Addendum)
 Please go to labor and delivery in [facility] to be evaluated for your fall and also have your baby and pregnancy monitored.

## 2024-09-30 NOTE — OB Triage Note (Signed)
 Patient discharged to ED per order.  She is stable and ambulatory as seen by obstetrics. An After Visit Summary was printed and given to the patient. Discharge education completed with patient and support person including follow up instructions, appointments, and medication list. She received labor and bleeding precautions. Patient able to verbalize understanding. All questions fully answered upon discharge. Patient instructed to return to ED, call 911, or call provider for any changes in condition. Patient discharged with all belongings.

## 2024-10-01 LAB — SYPHILIS: RPR W/REFLEX TO RPR TITER AND TREPONEMAL ANTIBODIES, TRADITIONAL SCREENING AND DIAGNOSIS ALGORITHM: RPR Ser Ql: NONREACTIVE
